# Patient Record
Sex: Male | Born: 1945 | Race: White | Hispanic: No | Marital: Married | Smoking: Former smoker
Health system: Southern US, Community
[De-identification: ages and names within clinical notes are randomized; demographics above are authoritative.]

## PROBLEM LIST (undated history)

## (undated) DIAGNOSIS — H269 Unspecified cataract: Secondary | ICD-10-CM

## (undated) DIAGNOSIS — E785 Hyperlipidemia, unspecified: Secondary | ICD-10-CM

## (undated) DIAGNOSIS — K219 Gastro-esophageal reflux disease without esophagitis: Secondary | ICD-10-CM

## (undated) DIAGNOSIS — R1310 Dysphagia, unspecified: Secondary | ICD-10-CM

## (undated) DIAGNOSIS — K2270 Barrett's esophagus without dysplasia: Secondary | ICD-10-CM

## (undated) DIAGNOSIS — I2510 Atherosclerotic heart disease of native coronary artery without angina pectoris: Secondary | ICD-10-CM

## (undated) DIAGNOSIS — J309 Allergic rhinitis, unspecified: Secondary | ICD-10-CM

## (undated) DIAGNOSIS — I10 Essential (primary) hypertension: Secondary | ICD-10-CM

## (undated) DIAGNOSIS — K224 Dyskinesia of esophagus: Secondary | ICD-10-CM

## (undated) DIAGNOSIS — R55 Syncope and collapse: Secondary | ICD-10-CM

## (undated) HISTORY — DX: Atherosclerotic heart disease of native coronary artery without angina pectoris: I25.10

## (undated) HISTORY — PX: TOTAL KNEE ARTHROPLASTY: SHX125

## (undated) HISTORY — PX: ABDOMINAL HERNIA REPAIR: SHX539

## (undated) HISTORY — DX: Syncope and collapse: R55

## (undated) HISTORY — PX: CARDIAC CATHETERIZATION: SHX172

## (undated) HISTORY — DX: Unspecified cataract: H26.9

## (undated) HISTORY — DX: Dysphagia, unspecified: R13.10

## (undated) HISTORY — DX: Allergic rhinitis, unspecified: J30.9

## (undated) HISTORY — DX: Dyskinesia of esophagus: K22.4

## (undated) HISTORY — DX: Gastro-esophageal reflux disease without esophagitis: K21.9

## (undated) HISTORY — DX: Barrett's esophagus without dysplasia: K22.70

## (undated) HISTORY — DX: Hyperlipidemia, unspecified: E78.5

---

## 2003-01-14 HISTORY — PX: GASTRIC BYPASS: SHX52

## 2005-01-13 HISTORY — PX: ROTATOR CUFF REPAIR: SHX139

## 2006-01-13 HISTORY — PX: CORONARY ANGIOPLASTY WITH STENT PLACEMENT: SHX49

## 2013-12-26 DIAGNOSIS — Z91199 Patient's noncompliance with other medical treatment and regimen due to unspecified reason: Secondary | ICD-10-CM | POA: Insufficient documentation

## 2014-06-21 DIAGNOSIS — Z9884 Bariatric surgery status: Secondary | ICD-10-CM | POA: Insufficient documentation

## 2014-06-21 DIAGNOSIS — E785 Hyperlipidemia, unspecified: Secondary | ICD-10-CM | POA: Insufficient documentation

## 2015-05-08 ENCOUNTER — Emergency Department (HOSPITAL_COMMUNITY)
Admission: EM | Admit: 2015-05-08 | Discharge: 2015-05-08 | Disposition: A | Discharge status: Home / Self Care | Payer: Medicare Other | Attending: Emergency Medicine | Admitting: Emergency Medicine

## 2015-05-08 ENCOUNTER — Emergency Department (HOSPITAL_COMMUNITY): Payer: Medicare Other

## 2015-05-08 ENCOUNTER — Encounter (HOSPITAL_COMMUNITY): Payer: Self-pay | Admitting: Family Medicine

## 2015-05-08 DIAGNOSIS — Z9889 Other specified postprocedural states: Secondary | ICD-10-CM | POA: Diagnosis not present

## 2015-05-08 DIAGNOSIS — J40 Bronchitis, not specified as acute or chronic: Secondary | ICD-10-CM | POA: Diagnosis not present

## 2015-05-08 DIAGNOSIS — Z7902 Long term (current) use of antithrombotics/antiplatelets: Secondary | ICD-10-CM | POA: Insufficient documentation

## 2015-05-08 DIAGNOSIS — Z7951 Long term (current) use of inhaled steroids: Secondary | ICD-10-CM | POA: Diagnosis not present

## 2015-05-08 DIAGNOSIS — H9209 Otalgia, unspecified ear: Secondary | ICD-10-CM | POA: Diagnosis not present

## 2015-05-08 DIAGNOSIS — Z79899 Other long term (current) drug therapy: Secondary | ICD-10-CM | POA: Insufficient documentation

## 2015-05-08 DIAGNOSIS — I10 Essential (primary) hypertension: Secondary | ICD-10-CM | POA: Insufficient documentation

## 2015-05-08 DIAGNOSIS — R05 Cough: Secondary | ICD-10-CM | POA: Diagnosis present

## 2015-05-08 HISTORY — DX: Essential (primary) hypertension: I10

## 2015-05-08 LAB — BASIC METABOLIC PANEL
Anion gap: 9 (ref 5–15)
BUN: 16 mg/dL (ref 6–20)
CALCIUM: 9.3 mg/dL (ref 8.9–10.3)
CHLORIDE: 108 mmol/L (ref 101–111)
CO2: 25 mmol/L (ref 22–32)
CREATININE: 1.09 mg/dL (ref 0.61–1.24)
GFR calc Af Amer: >60 mL/min (ref >60)
GLUCOSE: 83 mg/dL (ref 65–99)
Potassium: 4 mmol/L (ref 3.5–5.1)
SODIUM: 142 mmol/L (ref 135–145)

## 2015-05-08 LAB — CBC
HCT: 40.8 % (ref 39.0–52.0)
Hemoglobin: 13.3 g/dL (ref 13.0–17.0)
MCH: 26.8 pg (ref 26.0–34.0)
MCHC: 32.6 g/dL (ref 30.0–36.0)
MCV: 82.3 fL (ref 78.0–100.0)
PLATELETS: 313 10*3/uL (ref 150–400)
RBC: 4.96 MIL/uL (ref 4.22–5.81)
RDW: 16.6 % — AB (ref 11.5–15.5)
WBC: 12.7 10*3/uL — AB (ref 4.0–10.5)

## 2015-05-08 LAB — I-STAT TROPONIN, ED: TROPONIN I, POC: 0 ng/mL (ref 0.00–0.08)

## 2015-05-08 IMAGING — CR DG CHEST 2V
2 series · 2 of 2 positions shown · non-contrast
Comparison: None.

CLINICAL DATA: Cough, weakness and fever for 1 week. History
bronchitis.

EXAM:
CHEST  2 VIEW

[chest pa]
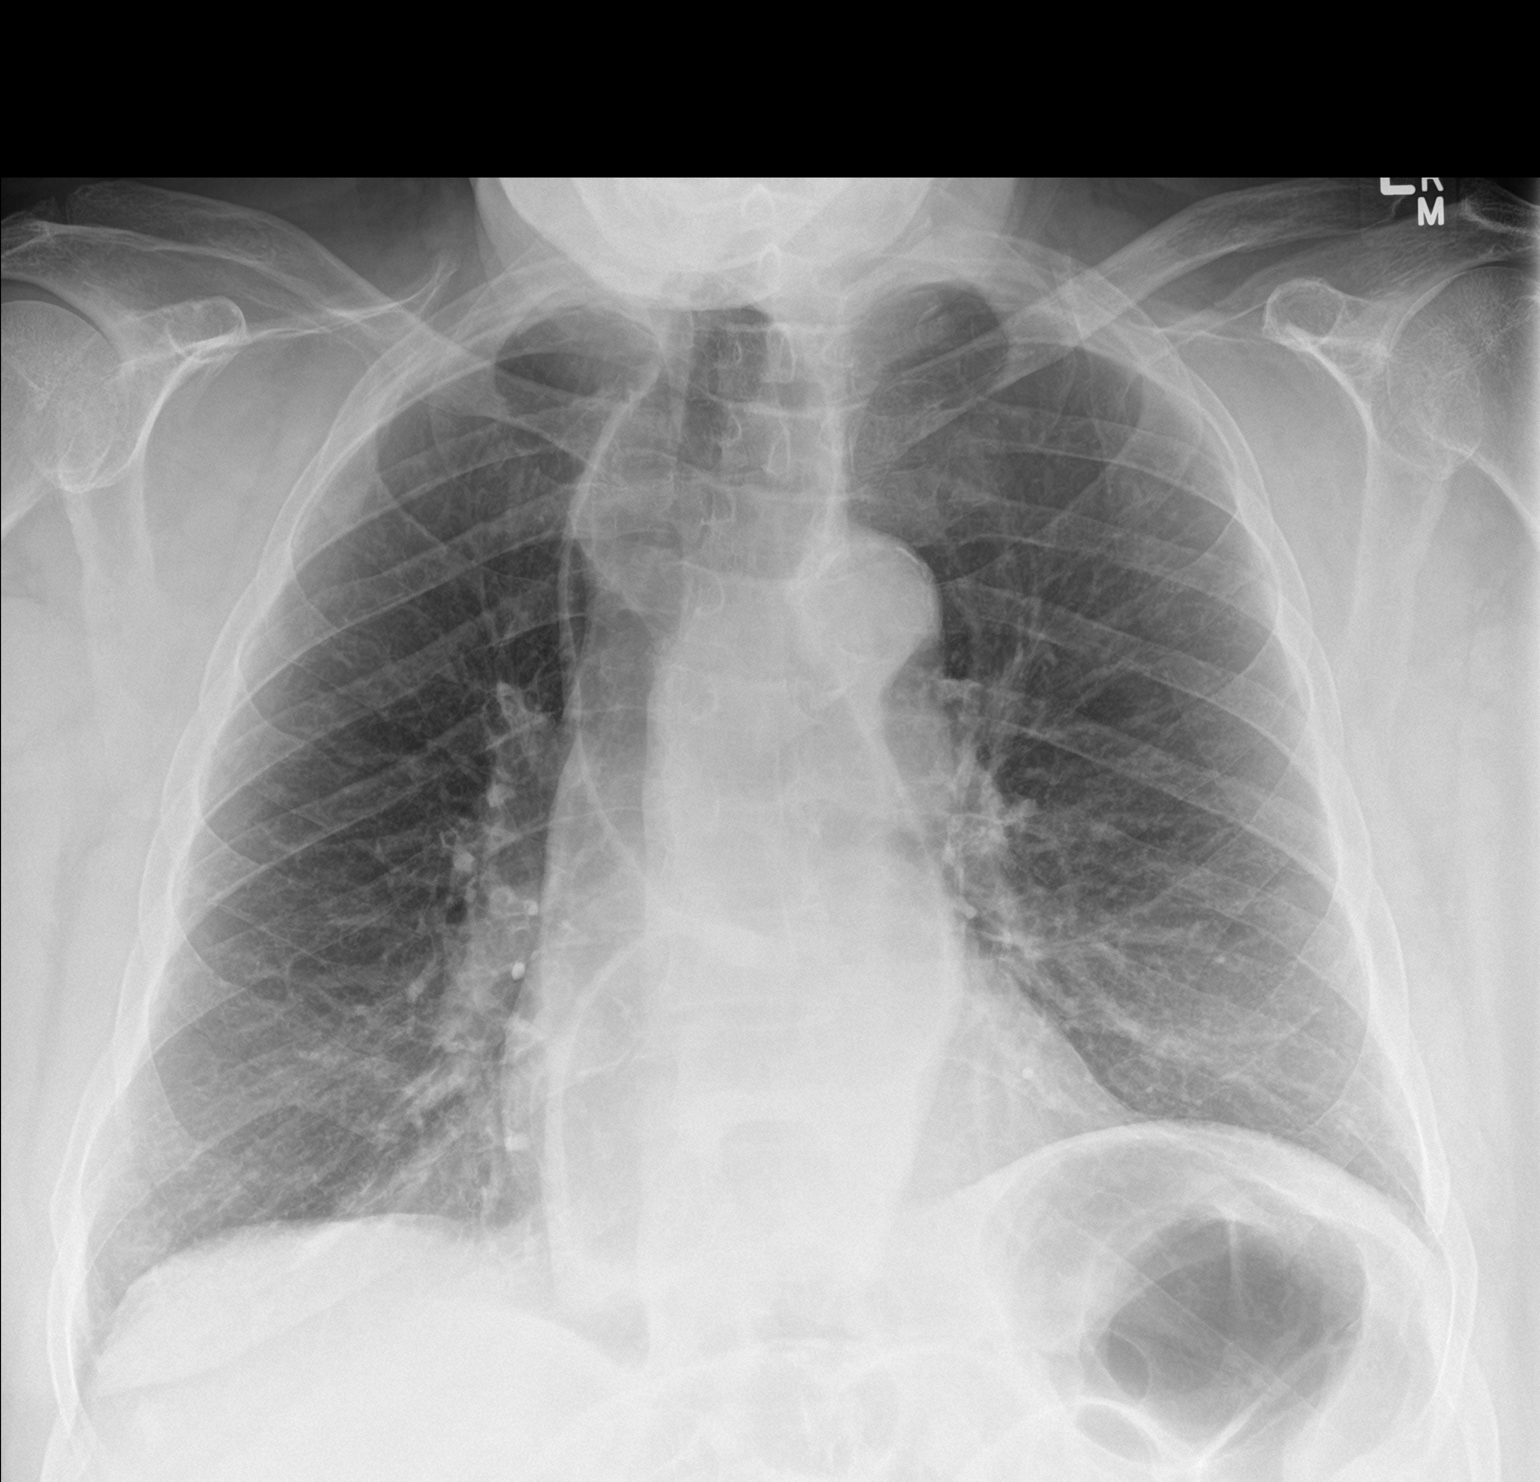

[chest lat]
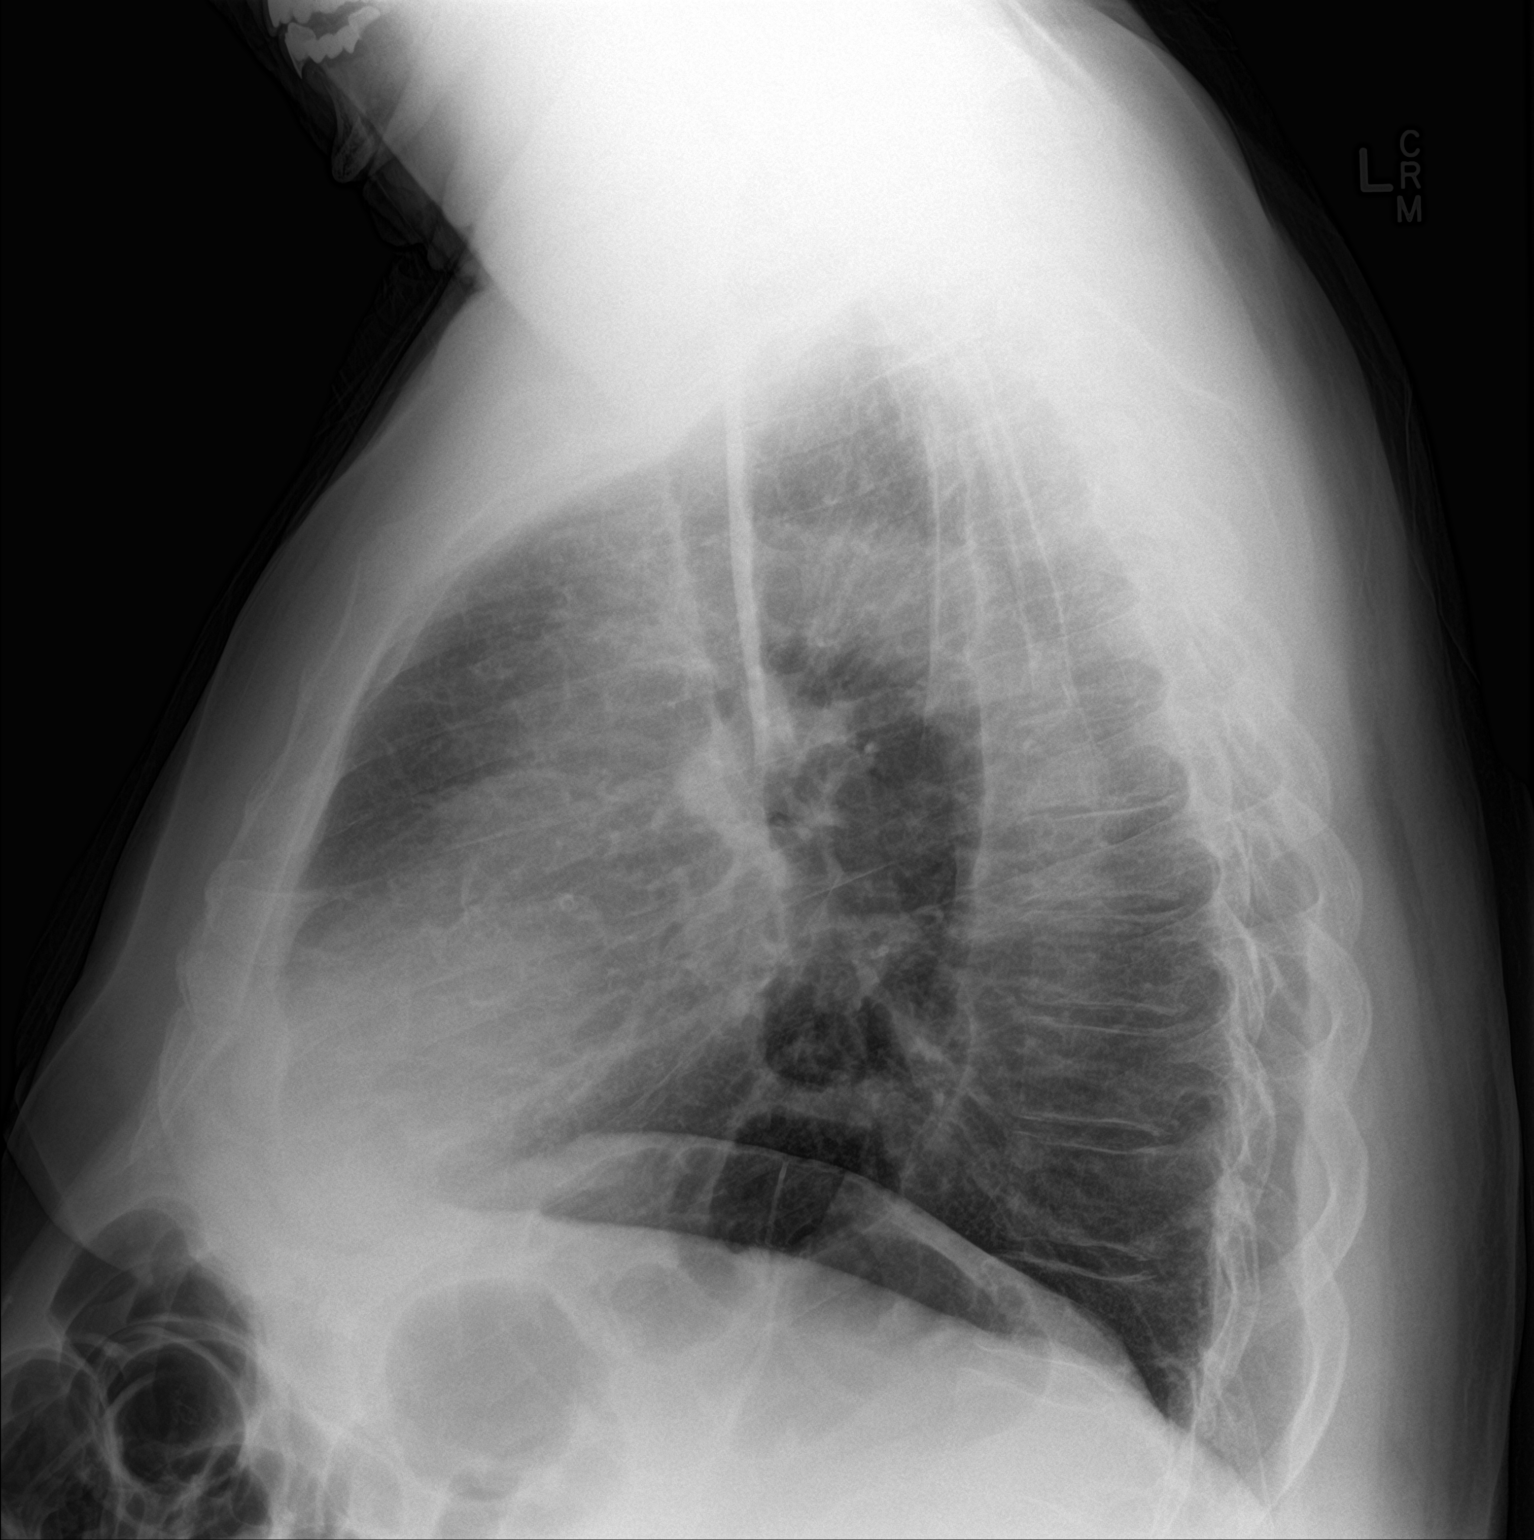

[2 of 2 positions shown; findings below may reference images not displayed]

FINDINGS: The lungs are clear. Heart size is normal. No pneumothorax or
pleural effusion. The patient has a markedly dilated and gas-filled
esophagus. No focal bony abnormality is identified.
IMPRESSION: No acute cardiopulmonary disease.

Markedly dilated esophagus is gas-filled.

## 2015-05-08 MED ORDER — ALBUTEROL SULFATE (2.5 MG/3ML) 0.083% IN NEBU
5.0000 mg | INHALATION_SOLUTION | Freq: Once | RESPIRATORY_TRACT | Status: AC
  Administered 2015-05-08: 5 mg via RESPIRATORY_TRACT
  Filled 2015-05-08: qty 6

## 2015-05-08 MED ORDER — ALBUTEROL SULFATE HFA 108 (90 BASE) MCG/ACT IN AERS: 2.0000 | INHALATION_SPRAY | RESPIRATORY_TRACT | Status: DC | PRN

## 2015-05-08 MED ORDER — PREDNISONE 20 MG PO TABS
60.0000 mg | ORAL_TABLET | Freq: Once | ORAL | Status: AC
  Administered 2015-05-08: 60 mg via ORAL
  Filled 2015-05-08: qty 3

## 2015-05-08 MED ORDER — ALBUTEROL SULFATE HFA 108 (90 BASE) MCG/ACT IN AERS
2.0000 | INHALATION_SPRAY | RESPIRATORY_TRACT | Status: DC | PRN
  Administered 2015-05-08: 2 via RESPIRATORY_TRACT
  Filled 2015-05-08: qty 6.7

## 2015-05-08 MED ORDER — BENZONATATE 100 MG PO CAPS: 100.0000 mg | ORAL_CAPSULE | Freq: Three times a day (TID) | ORAL | Status: DC

## 2015-05-08 NOTE — Discharge Instructions (Signed)
Upper Respiratory Infection, Adult Most upper respiratory infections (URIs) are a viral infection of the air passages leading to the lungs. A URI affects the nose, throat, and upper air passages. The most common type of URI is nasopharyngitis and is typically referred to as "the common cold." URIs run their course and usually go away on their own. Most of the time, a URI does not require medical attention, but sometimes a bacterial infection in the upper airways can follow a viral infection. This is called a secondary infection. Sinus and middle ear infections are common types of secondary upper respiratory infections. Bacterial pneumonia can also complicate a URI. A URI can worsen asthma and chronic obstructive pulmonary disease (COPD). Sometimes, these complications can require emergency medical care and may be life threatening.  CAUSES Almost all URIs are caused by viruses. A virus is a type of germ and can spread from one person to another.  RISKS FACTORS You may be at risk for a URI if:   You smoke.   You have chronic heart or lung disease.  You have a weakened defense (immune) system.   You are very young or very old.   You have nasal allergies or asthma.  You work in crowded or poorly ventilated areas.  You work in health care facilities or schools. SIGNS AND SYMPTOMS  Symptoms typically develop 2-3 days after you come in contact with a cold virus. Most viral URIs last 7-10 days. However, viral URIs from the influenza virus (flu virus) can last 14-18 days and are typically more severe. Symptoms may include:   Runny or stuffy (congested) nose.   Sneezing.   Cough.   Sore throat.   Headache.   Fatigue.   Fever.   Loss of appetite.   Pain in your forehead, behind your eyes, and over your cheekbones (sinus pain).  Muscle aches.  DIAGNOSIS  Your health care provider may diagnose a URI by:  Physical exam.  Tests to check that your symptoms are not due to  another condition such as:  Strep throat.  Sinusitis.  Pneumonia.  Asthma. TREATMENT  A URI goes away on its own with time. It cannot be cured with medicines, but medicines may be prescribed or recommended to relieve symptoms. Medicines may help:  Reduce your fever.  Reduce your cough.  Relieve nasal congestion. HOME CARE INSTRUCTIONS   Take medicines only as directed by your health care provider.   Gargle warm saltwater or take cough drops to comfort your throat as directed by your health care provider.  Use a warm mist humidifier or inhale steam from a shower to increase air moisture. This may make it easier to breathe.  Drink enough fluid to keep your urine clear or pale yellow.   Eat soups and other clear broths and maintain good nutrition.   Rest as needed.   Return to work when your temperature has returned to normal or as your health care provider advises. You may need to stay home longer to avoid infecting others. You can also use a face mask and careful hand washing to prevent spread of the virus.  Increase the usage of your inhaler if you have asthma.   Do not use any tobacco products, including cigarettes, chewing tobacco, or electronic cigarettes. If you need help quitting, ask your health care provider. PREVENTION  The best way to protect yourself from getting a cold is to practice good hygiene.   Avoid oral or hand contact with people with cold   symptoms.   Wash your hands often if contact occurs.  There is no clear evidence that vitamin C, vitamin E, echinacea, or exercise reduces the chance of developing a cold. However, it is always recommended to get plenty of rest, exercise, and practice good nutrition.  SEEK MEDICAL CARE IF:   You are getting worse rather than better.   Your symptoms are not controlled by medicine.   You have chills.  You have worsening shortness of breath.  You have brown or red mucus.  You have yellow or brown nasal  discharge.  You have pain in your face, especially when you bend forward.  You have a fever.  You have swollen neck glands.  You have pain while swallowing.  You have white areas in the back of your throat. SEEK IMMEDIATE MEDICAL CARE IF:   You have severe or persistent:  Headache.  Ear pain.  Sinus pain.  Chest pain.  You have chronic lung disease and any of the following:  Wheezing.  Prolonged cough.  Coughing up blood.  A change in your usual mucus.  You have a stiff neck.  You have changes in your:  Vision.  Hearing.  Thinking.  Mood. MAKE SURE YOU:   Understand these instructions.  Will watch your condition.  Will get help right away if you are not doing well or get worse.   This information is not intended to replace advice given to you by your health care provider. Make sure you discuss any questions you have with your health care provider.   Document Released: 06/25/2000 Document Revised: 05/16/2014 Document Reviewed: 04/06/2013 Elsevier Interactive Patient Education 2016 [employer]  

## 2015-05-08 NOTE — ED Provider Notes (Signed)
CSN: [id number]     Arrival date & time 05/08/15  1535 History   First MD Initiated Contact with Patient 05/08/15 1948     Chief Complaint  Patient presents with  . Cough  . Weakness     (Consider location/radiation/quality/duration/timing/severity/associated sxs/prior Treatment) HPI Comments: Patient is a 70 year old male with a past history of hypertension. He presents with a one-week history of worsening cough and fatigue. He states his cough is mostly been productive of clear sputum however today it's been a little bit green. He's had some wheezing and intermittent shortness of breath. He denies any chest pain. No leg swelling. No known fevers. [facility] He was started on Augmentin, Flonase, Mucinex, Zyrtec, Tessalon Perles and Atrovent nasal spray. He states he's not have any improvement in symptoms.  He has had associated rhinorrhea, sinus pressure and right ear pain.  Patient is a 70 y.o. male presenting with cough and weakness.  Cough Associated symptoms: ear pain, rhinorrhea, shortness of breath and wheezing   Associated symptoms: no chest pain, no chills, no diaphoresis, no fever, no headaches and no rash   Weakness Associated symptoms include shortness of breath. Pertinent negatives include no chest pain, no abdominal pain and no headaches.    Past Medical History  Diagnosis Date  . Hypertension    Past Surgical History  Procedure Laterality Date  . Abdominal surgery    . Cardiac surgery     History reviewed. No pertinent family history. Social History  Substance Use Topics  . Smoking status: Never Smoker   . Smokeless tobacco: None  . Alcohol Use: None    Review of Systems  Constitutional: Positive for fatigue. Negative for fever, chills and diaphoresis.  HENT: Positive for ear pain, postnasal drip, rhinorrhea and sinus pressure. Negative for congestion and sneezing.   Eyes: Negative.   Respiratory: Positive for cough, shortness of  breath and wheezing. Negative for chest tightness.   Cardiovascular: Negative for chest pain and leg swelling.  Gastrointestinal: Negative for nausea, vomiting, abdominal pain, diarrhea and blood in stool.  Genitourinary: Negative for frequency, hematuria, flank pain and difficulty urinating.  Musculoskeletal: Negative for back pain and arthralgias.  Skin: Negative for rash.  Neurological: Positive for weakness. Negative for dizziness, speech difficulty, numbness and headaches.      Allergies  Review of patient's allergies indicates no known allergies.  Home Medications   Prior to Admission medications   Medication Sig Start Date End Date Taking? Authorizing Provider  B Complex Vitamins (VITAMIN-B COMPLEX PO) Take 1 tablet by mouth daily.   Yes Historical Provider, MD  cetirizine (ZYRTEC) 10 MG tablet Take 10 mg by mouth daily.   Yes Historical Provider, MD  clopidogrel (PLAVIX) 75 MG tablet Take 75 mg by mouth daily.   Yes Historical Provider, MD  co-enzyme Q-10 50 MG capsule Take 50 mg by mouth daily.   Yes Historical Provider, MD  fluticasone (FLONASE) 50 MCG/ACT nasal spray Place 1 spray into both nostrils daily.   Yes Historical Provider, MD  guaiFENesin (MUCINEX) 600 MG 12 hr tablet Take 600 mg by mouth 2 (two) times daily as needed for cough.   Yes Historical Provider, MD  ipratropium (ATROVENT) 0.06 % nasal spray Place 2 sprays into both nostrils 3 (three) times daily.   Yes Historical Provider, MD  olmesartan-hydrochlorothiazide (BENICAR HCT) 20-12.5 MG tablet Take 1 tablet by mouth daily.   Yes Historical Provider, MD  omeprazole (PRILOSEC) 40 MG capsule Take 40  mg by mouth daily.   Yes Historical Provider, MD  ranolazine (RANEXA) 500 MG 12 hr tablet Take 500 mg by mouth 2 (two) times daily.   Yes Historical Provider, MD  benzonatate (TESSALON) 100 MG capsule Take 1 capsule (100 mg total) by mouth every 8 (eight) hours. 05/08/15   Rolan Bucco, MD   BP 121/61 mmHg  Pulse 107   Temp(Src) 98.2 F (36.8 C) (Oral)  Resp 22  SpO2 95% Physical Exam  Constitutional: He is oriented to person, place, and time. He appears well-developed and well-nourished.  HENT:  Head: Normocephalic and atraumatic.  Right Ear: External ear normal.  Left Ear: External ear normal.  Nose: Nose normal.  Mouth/Throat: Oropharynx is clear and moist. No oropharyngeal exudate.  Eyes: Pupils are equal, round, and reactive to light.  Neck: Normal range of motion. Neck supple.  Cardiovascular: Normal rate, regular rhythm and normal heart sounds.   Pulmonary/Chest: Effort normal. No respiratory distress. He has wheezes. He has no rales. He exhibits no tenderness.  Abdominal: Soft. Bowel sounds are normal. There is no tenderness. There is no rebound and no guarding.  Musculoskeletal: Normal range of motion. He exhibits no edema.  Lymphadenopathy:    He has no cervical adenopathy.  Neurological: He is alert and oriented to person, place, and time.  Skin: Skin is warm and dry. No rash noted.  Psychiatric: He has a normal mood and affect.    ED Course  Procedures (including critical care time) Labs Review Labs Reviewed  CBC - Abnormal; Notable for the following:    WBC 12.7 (*)    RDW 16.6 (*)    All other components within normal limits  BASIC METABOLIC PANEL  I-STAT TROPOININ, ED    Imaging Review Dg Chest 2 View  05/08/2015  CLINICAL DATA:  Cough, weakness and fever for 1 week. History bronchitis. EXAM: CHEST  2 VIEW COMPARISON:  None. FINDINGS: The lungs are clear. Heart size is normal. No pneumothorax or pleural effusion. The patient has a markedly dilated and gas-filled esophagus. No focal bony abnormality is identified. IMPRESSION: No acute cardiopulmonary disease. Markedly dilated esophagus is gas-filled. Electronically Signed   By: Drusilla Kanner M.D.   On: 05/08/2015 16:29   I have personally reviewed and evaluated these images and lab results as part of my medical  decision-making.   EKG Interpretation None      MDM   Final diagnoses:  Bronchitis    Patient presents with worsening cough. There is no evidence of pneumonia. His oxygen levels are normal. He has no increased work of breathing. He does have some wheezing on exam and was given an albuterol neb. He was feeling a little bit better after this. His lungs are essentially clear following this. His symptoms sound consistent with bronchitis. There is nothing that would suggest this being a cardiac origin. There is no suggestions of pulmonary embolus. He is very antsy to leave. He will continue his medications that were prescribed yesterday that he started this morning. He also was given a prescription for an additional amount of Tessalon pearls as he was concerned about running out. He is currently in process of moving to [facility] and has not yet established care. He can return here as needed for any ongoing or worsening symptoms.  Rolan Bucco, MD 05/08/15 2252

## 2015-05-08 NOTE — ED Notes (Addendum)
Pt here for cough, weakness, cold x 1 week. sts fever. sts some diarrhea yesterday. Pt currently being treated with abx for URI.

## 2015-05-14 ENCOUNTER — Emergency Department (HOSPITAL_COMMUNITY)
Admission: EM | Admit: 2015-05-14 | Discharge: 2015-05-14 | Disposition: A | Discharge status: Home / Self Care | Payer: Medicare Other | Attending: Emergency Medicine | Admitting: Emergency Medicine

## 2015-05-14 ENCOUNTER — Encounter (HOSPITAL_COMMUNITY): Payer: Self-pay | Admitting: Emergency Medicine

## 2015-05-14 ENCOUNTER — Emergency Department (HOSPITAL_COMMUNITY): Payer: Medicare Other

## 2015-05-14 DIAGNOSIS — R05 Cough: Secondary | ICD-10-CM

## 2015-05-14 DIAGNOSIS — J40 Bronchitis, not specified as acute or chronic: Secondary | ICD-10-CM

## 2015-05-14 DIAGNOSIS — Z7902 Long term (current) use of antithrombotics/antiplatelets: Secondary | ICD-10-CM | POA: Insufficient documentation

## 2015-05-14 DIAGNOSIS — Z79899 Other long term (current) drug therapy: Secondary | ICD-10-CM | POA: Insufficient documentation

## 2015-05-14 DIAGNOSIS — I10 Essential (primary) hypertension: Secondary | ICD-10-CM | POA: Diagnosis not present

## 2015-05-14 DIAGNOSIS — R059 Cough, unspecified: Secondary | ICD-10-CM

## 2015-05-14 IMAGING — CR DG CHEST 2V
2 series · 2 of 2 positions shown · non-contrast
Comparison: [DATE].

CLINICAL DATA: Cough and shortness of breath.

EXAM:
CHEST  2 VIEW

[w chest pa]
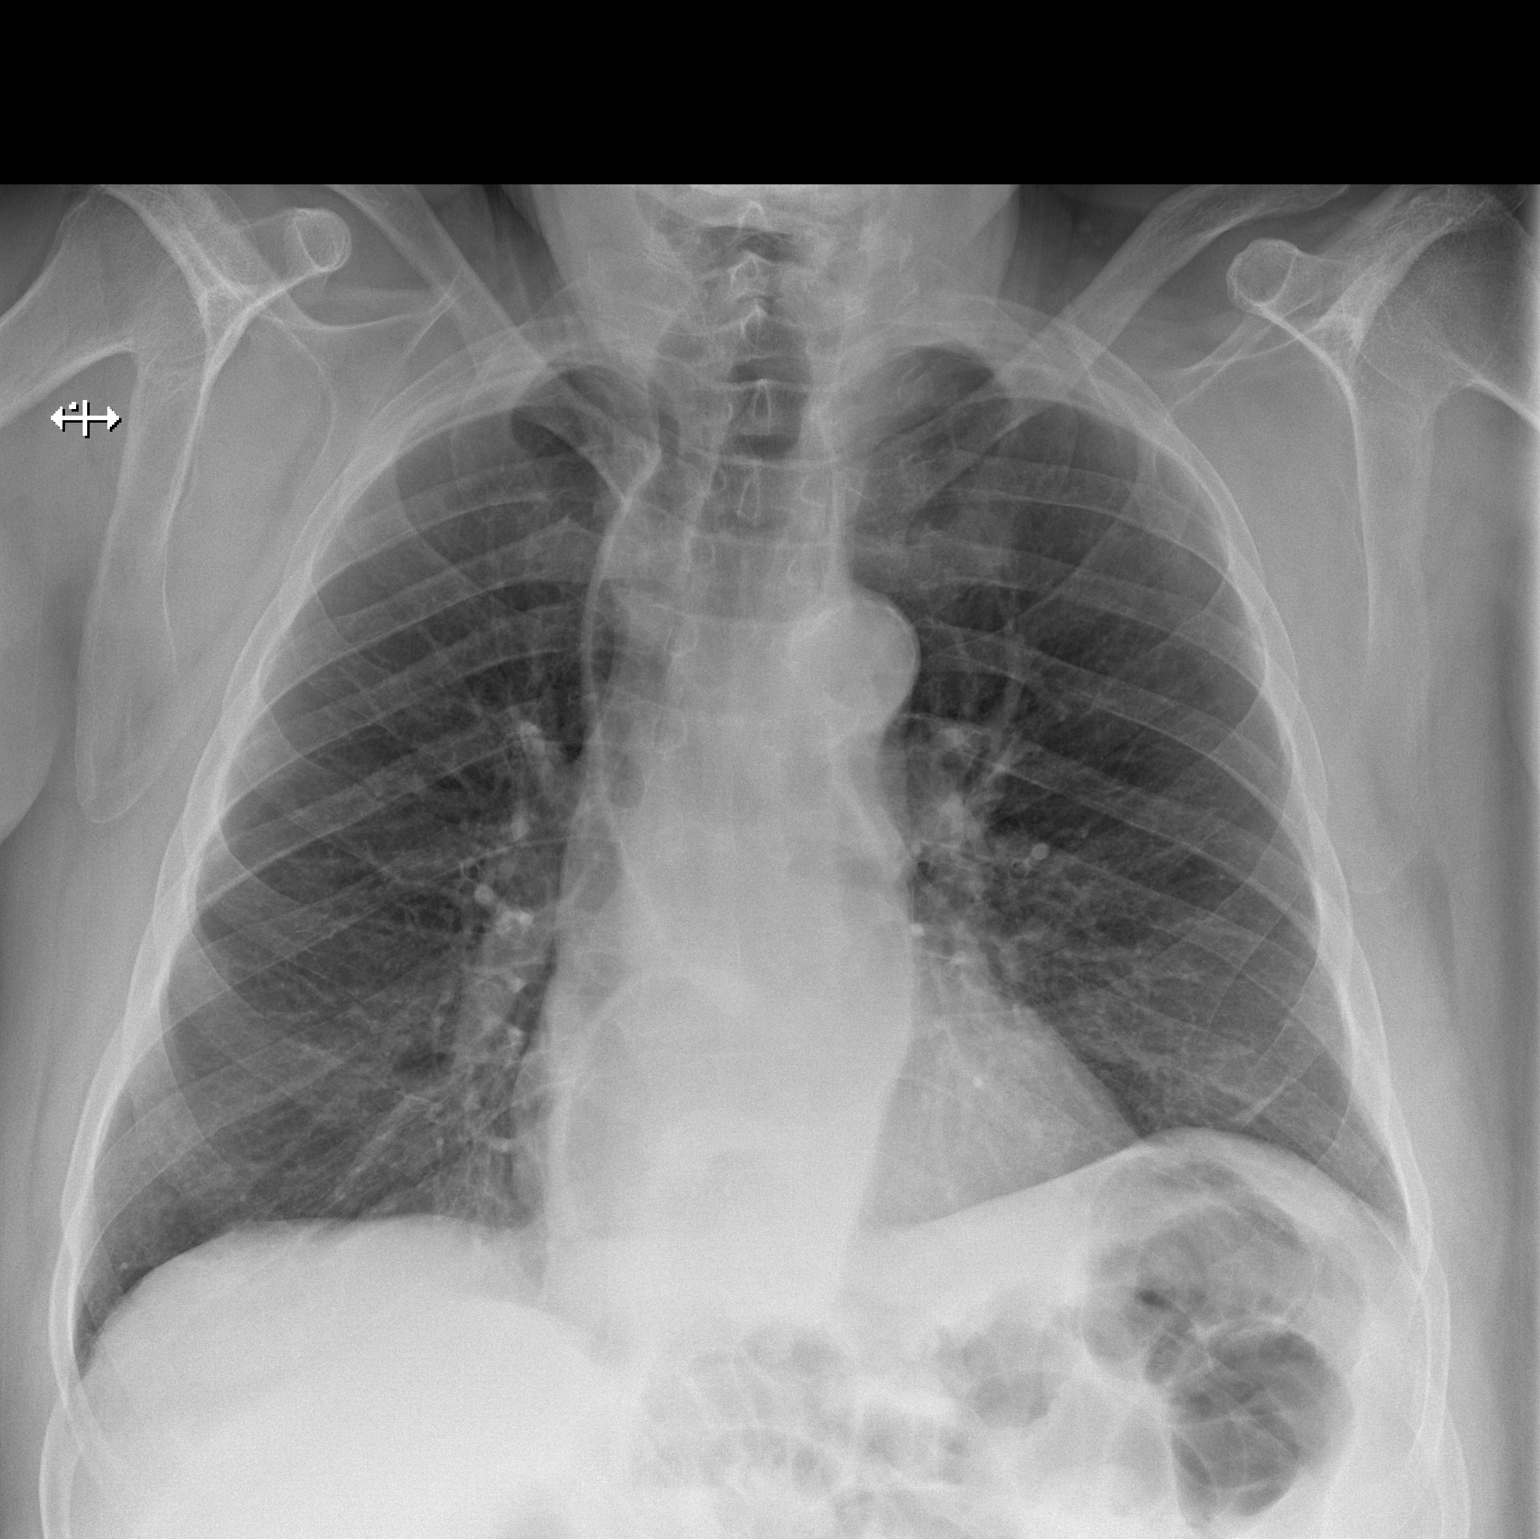

[w chest lat]
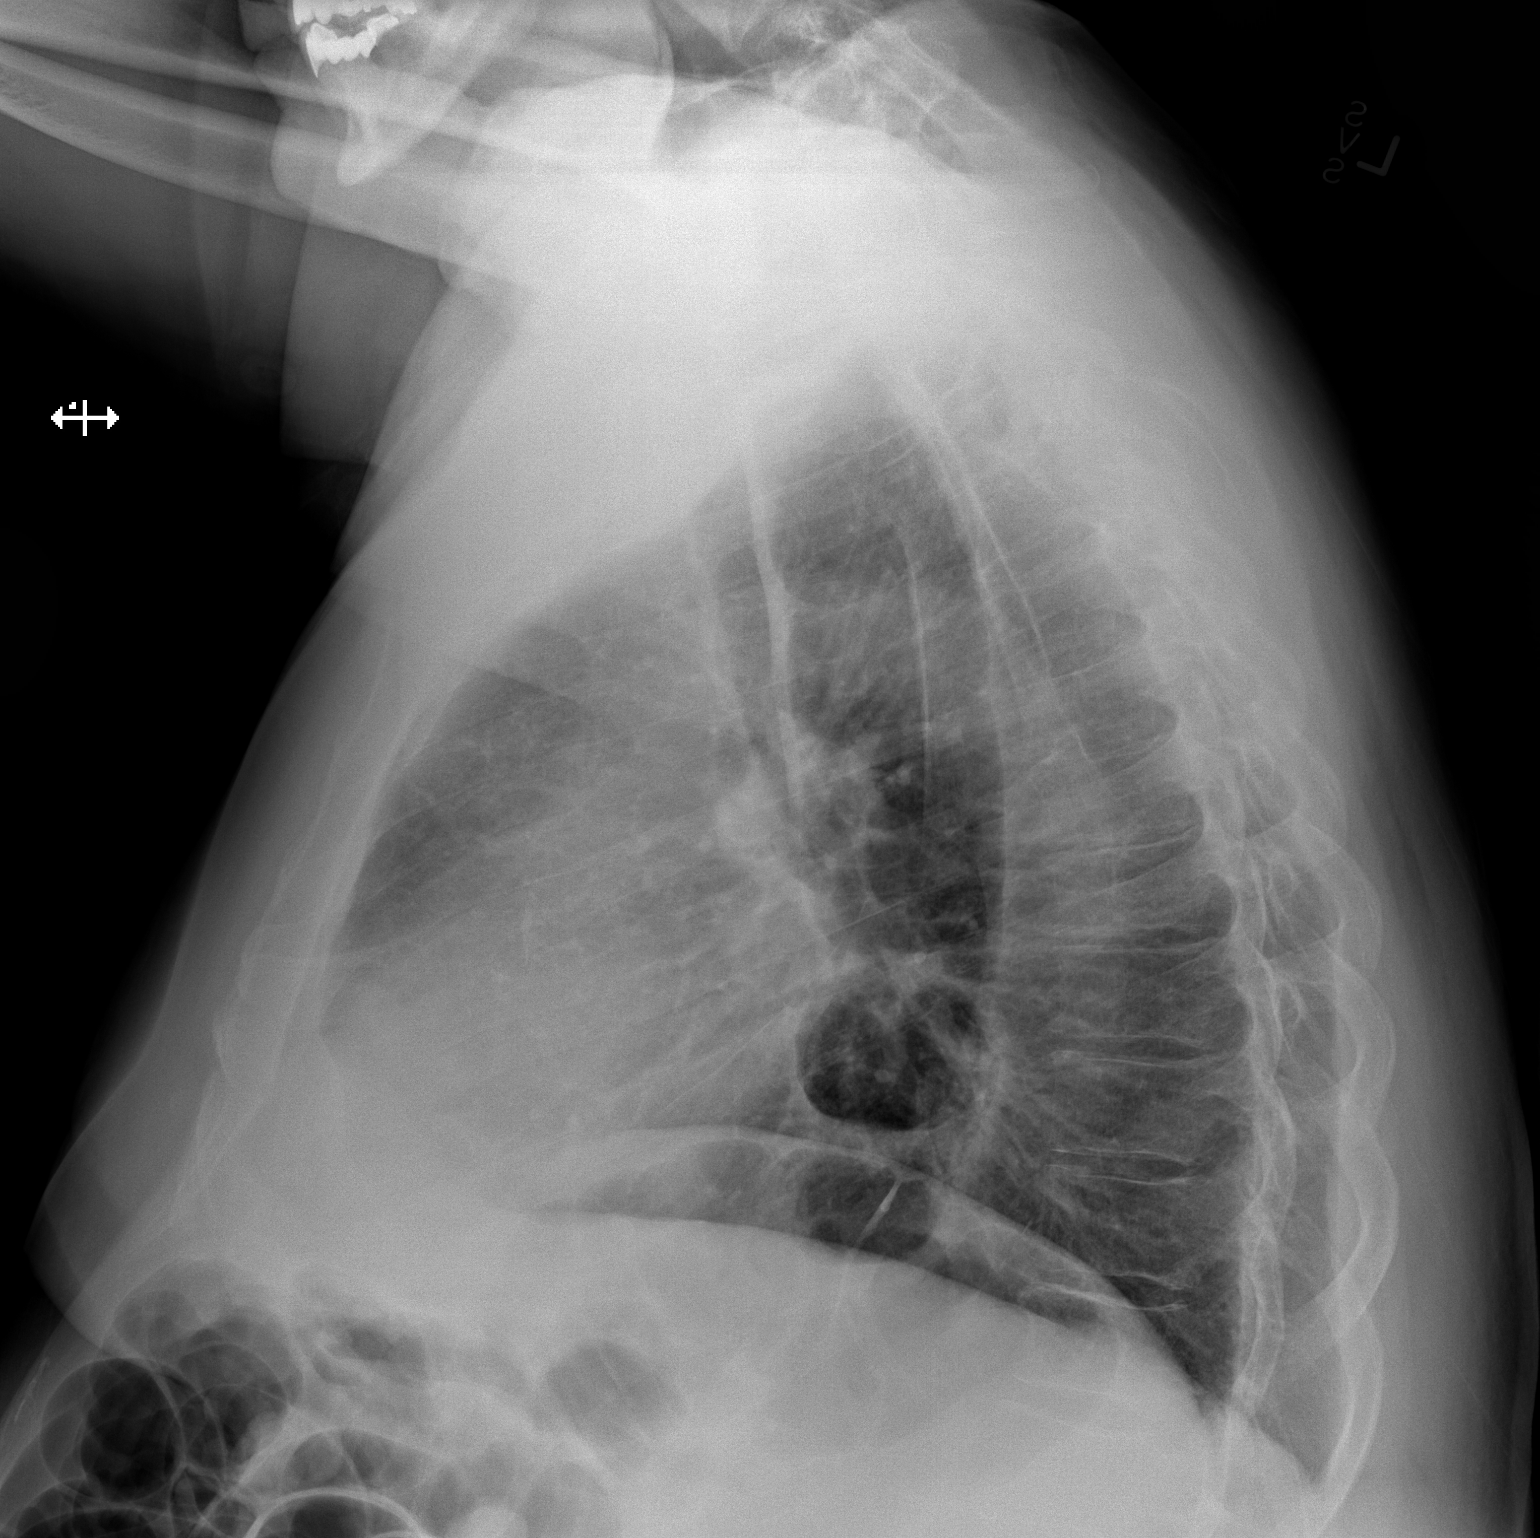

[2 of 2 positions shown; findings below may reference images not displayed]

FINDINGS: Normal sized heart. Clear lungs. Mild central peribronchial
thickening. Again demonstrated is a markedly dilated, tortuous,
gas-filled esophagus. There is also a possible hiatal hernia.
Unremarkable bones.
IMPRESSION: 1. No acute abnormality.
2. Stable mild bronchitic changes.
3. Stable dilated, gas-filled esophagus and possible hiatal hernia.

## 2015-05-14 MED ORDER — ALBUTEROL SULFATE (2.5 MG/3ML) 0.083% IN NEBU: 5.0000 mg | INHALATION_SOLUTION | Freq: Once | RESPIRATORY_TRACT | Status: DC

## 2015-05-14 MED ORDER — GUAIFENESIN-CODEINE 100-10 MG/5ML PO SOLN: 10.0000 mL | Freq: Three times a day (TID) | ORAL | Status: DC | PRN

## 2015-05-14 MED ORDER — FLUTICASONE PROPIONATE 50 MCG/ACT NA SUSP: 2.0000 | Freq: Every day | NASAL | Status: DC

## 2015-05-14 MED ORDER — PREDNISONE 20 MG PO TABS: 40.0000 mg | ORAL_TABLET | Freq: Every day | ORAL | Status: DC

## 2015-05-14 MED ORDER — IPRATROPIUM-ALBUTEROL 0.5-2.5 (3) MG/3ML IN SOLN
3.0000 mL | Freq: Once | RESPIRATORY_TRACT | Status: AC
  Administered 2015-05-14: 3 mL via RESPIRATORY_TRACT
  Filled 2015-05-14: qty 3

## 2015-05-14 NOTE — ED Notes (Signed)
Called pt back to triage x1 with no response 

## 2015-05-14 NOTE — ED Provider Notes (Signed)
CSN: [id number]     Arrival date & time 05/14/15  1511 History   First MD Initiated Contact with Patient 05/14/15 2102     Chief Complaint  Patient presents with  . Cough  . Shortness of Breath   HPI   70 year old male presents today for reevaluation of chronic cough. Patient reports that 10 days ago he had very minor upper respiratory complaints with cough that has persisted. Patient notes that several days after onset of symptoms [facility], he was prescribed amoxicillin and Tessalon Perles. He reports symptoms continue to persist with worsening cough. 5 days ago [facility] was [facility] with [facility], given a breathing treatment, inhaler and Tessalon Perles again. Patient reports the breathing treatment improved his symptoms, but this only lasted one day. Patient notes that he followed up 4 days ago in a emergency room in [place], was given steroids IV, given prednisone 5 day course, and cough medicine with codeine. Patient reports that this provided minor relief but he is running out of the cough medication. Patient notes mild shortness of breath associated with cough, he denies any chest pain, baseline shortness of breath, upper respiratory congestion, indigestion, abdominal pain, fever, nausea or vomiting. Patient denies any history of heart failure or lower extremity swelling or edema. Patient reports he is a nonsmoker, no history of COPD asthma, living in [place] to [place].       Past Medical History  Diagnosis Date  . Hypertension    Past Surgical History  Procedure Laterality Date  . Abdominal surgery    . Cardiac surgery     History reviewed. No pertinent family history. Social History  Substance Use Topics  . Smoking status: Never Smoker   . Smokeless tobacco: None  . Alcohol Use: None    Review of Systems  All other systems reviewed and are negative.   Allergies  Review of patient's allergies  indicates no known allergies.  Home Medications   Prior to Admission medications   Medication Sig Start Date End Date Taking? Authorizing Provider  B Complex Vitamins (VITAMIN-B COMPLEX PO) Take 1 tablet by mouth daily.    Historical Provider, MD  benzonatate (TESSALON) 100 MG capsule Take 1 capsule (100 mg total) by mouth every 8 (eight) hours. 05/08/15   Rolan Bucco, MD  cetirizine (ZYRTEC) 10 MG tablet Take 10 mg by mouth daily.    Historical Provider, MD  clopidogrel (PLAVIX) 75 MG tablet Take 75 mg by mouth daily.    Historical Provider, MD  co-enzyme Q-10 50 MG capsule Take 50 mg by mouth daily.    Historical Provider, MD  fluticasone (FLONASE) 50 MCG/ACT nasal spray Place 2 sprays into both nostrils daily. 05/14/15   Eyvonne Mechanic, PA-C  guaiFENesin (MUCINEX) 600 MG 12 hr tablet Take 600 mg by mouth 2 (two) times daily as needed for cough.    Historical Provider, MD  guaiFENesin-codeine 100-10 MG/5ML syrup Take 10 mLs by mouth 3 (three) times daily as needed for cough. 05/14/15   Eyvonne Mechanic, PA-C  ipratropium (ATROVENT) 0.06 % nasal spray Place 2 sprays into both nostrils 3 (three) times daily.    Historical Provider, MD  olmesartan-hydrochlorothiazide (BENICAR HCT) 20-12.5 MG tablet Take 1 tablet by mouth daily.    Historical Provider, MD  omeprazole (PRILOSEC) 40 MG capsule Take 40 mg by mouth daily.    Historical Provider, MD  predniSONE (DELTASONE) 20 MG tablet Take 2 tablets (40  mg total) by mouth daily. 05/14/15   Eyvonne MechanicJeffrey Kalia Vahey, PA-C  ranolazine (RANEXA) 500 MG 12 hr tablet Take 500 mg by mouth 2 (two) times daily.    Historical Provider, MD   BP 121/87 mmHg  Pulse 85  Temp(Src) 97.9 F (36.6 C) (Oral)  Resp 20  SpO2 97%   Physical Exam  Constitutional: He is oriented to person, place, and time. He appears well-developed and well-nourished.  Cough noted  HENT:  Head: Normocephalic and atraumatic.  Right Ear: Hearing and tympanic membrane normal.  Left Ear: Hearing and  tympanic membrane normal.  Mouth/Throat: Uvula is midline and oropharynx is clear and moist. No oropharyngeal exudate, posterior oropharyngeal edema, posterior oropharyngeal erythema or tonsillar abscesses.  Eyes: Conjunctivae are normal. Pupils are equal, round, and reactive to light. Right eye exhibits no discharge. Left eye exhibits no discharge. No scleral icterus.  Neck: Normal range of motion. No JVD present. No tracheal deviation present.  Cardiovascular: Normal rate, regular rhythm, normal heart sounds and intact distal pulses.  Exam reveals no gallop and no friction rub.   No murmur heard. Pulmonary/Chest: Effort normal and breath sounds normal. No stridor. No respiratory distress. He has no wheezes. He has no rales. He exhibits no tenderness.  Abdominal: There is no tenderness.  Musculoskeletal: Normal range of motion. He exhibits no edema or tenderness.  Neurological: He is alert and oriented to person, place, and time. Coordination normal.  Skin: Skin is warm. No rash noted. No erythema. No pallor.  Psychiatric: He has a normal mood and affect. His behavior is normal. Judgment and thought content normal.  Nursing note and vitals reviewed.   ED Course  Procedures (including critical care time) Labs Review Labs Reviewed - No data to display  Imaging Review Dg Chest 2 View  05/14/2015  CLINICAL DATA:  Cough and shortness of breath. EXAM: CHEST  2 VIEW COMPARISON:  05/08/2015. FINDINGS: Normal sized heart. Clear lungs. Mild central peribronchial thickening. Again demonstrated is a markedly dilated, tortuous, gas-filled esophagus. There is also a possible hiatal hernia. Unremarkable bones. IMPRESSION: 1. No acute abnormality. 2. Stable mild bronchitic changes. 3. Stable dilated, gas-filled esophagus and possible hiatal hernia. Electronically Signed   By: Beckie SaltsSteven  Reid M.D.   On: 05/14/2015 16:38   I have personally reviewed and evaluated these images and lab results as part of my medical  decision-making.   EKG Interpretation   Date/Time:  Monday May 14 2015 21:10:18 EDT Ventricular Rate:  88 PR Interval:  173 QRS Duration: 178 QT Interval:  456 QTC Calculation: 552 R Axis:   -37 Text Interpretation:  Sinus rhythm Left bundle branch block No previous  tracing Confirmed by Anitra LauthPLUNKETT  MD, Alphonzo LemmingsWHITNEY ([id number]) on 05/14/2015 9:22:56 PM      MDM   Final diagnoses:  Bronchitis  Cough    Labs:   Imaging:DG chest 2 view  Consults:  Therapeutics: DuoNeb  Discharge Meds: Prednisone, Flonase, guaifenesin- codeine   Assessment/Plan: 70 year old male presents today with likely bronchitis. Patient has no signs of infectious etiology, he has reassuring vital signs, no significant findings on physical exam. Continued cough may be expected with bronchitis, he will be instructed to continue using albuterol, Flonase, prednisone, and cough medication as needed. Patient requesting follow-up with pulmonology, he is given pulmonology follow-up and strict return precautions. Patient verbalized understanding and agreement to today's plan. Patients care shared with Gwyneth SproutWhitney Plunkett MD.         Eyvonne MechanicJeffrey Bartolo Montanye, PA-C 05/15/15 0011  Gwyneth SproutWhitney Plunkett,  MD 05/15/15 2044

## 2015-05-14 NOTE — ED Notes (Addendum)
Patient here with complaints of uncontrolled cough. States that this is his third visit to the ER. Antibiotics, steroids, tessalon, albuterol with no relief.  Patient states only relief is standing over table.

## 2015-05-14 NOTE — Discharge Instructions (Signed)
Please follow-up with pulmonology if you continue to experience concerning signs or symptoms. Please return to the emergency room if any new or worsening signs or symptoms present.

## 2015-05-16 ENCOUNTER — Institutional Professional Consult (permissible substitution): Payer: Medicare Other | Admitting: Internal Medicine

## 2015-05-18 DIAGNOSIS — D500 Iron deficiency anemia secondary to blood loss (chronic): Secondary | ICD-10-CM | POA: Insufficient documentation

## 2015-05-23 ENCOUNTER — Encounter: Payer: Self-pay | Admitting: Pulmonary Disease

## 2015-05-23 ENCOUNTER — Ambulatory Visit (INDEPENDENT_AMBULATORY_CARE_PROVIDER_SITE_OTHER): Payer: Medicare Other | Admitting: Pulmonary Disease

## 2015-05-23 VITALS — BP 138/76 | HR 81 | Ht 66.0 in | Wt 242.0 lb

## 2015-05-23 DIAGNOSIS — R059 Cough, unspecified: Secondary | ICD-10-CM | POA: Insufficient documentation

## 2015-05-23 DIAGNOSIS — I10 Essential (primary) hypertension: Secondary | ICD-10-CM | POA: Insufficient documentation

## 2015-05-23 DIAGNOSIS — I2510 Atherosclerotic heart disease of native coronary artery without angina pectoris: Secondary | ICD-10-CM | POA: Insufficient documentation

## 2015-05-23 DIAGNOSIS — R05 Cough: Secondary | ICD-10-CM

## 2015-05-23 DIAGNOSIS — K219 Gastro-esophageal reflux disease without esophagitis: Secondary | ICD-10-CM

## 2015-05-23 DIAGNOSIS — I447 Left bundle-branch block, unspecified: Secondary | ICD-10-CM | POA: Insufficient documentation

## 2015-05-23 DIAGNOSIS — J309 Allergic rhinitis, unspecified: Secondary | ICD-10-CM

## 2015-05-23 NOTE — Progress Notes (Signed)
Subjective:    Patient ID: Ian [name], male    DOB: [birth year], 70 y.o.   MRN: [id number]  HPI He reports he has had bronchitis twice since January 2017. He gets a cough & bronchitis once yearly treated with antibiotics & Prednisone. He reports remote history of pneumonia as a child. He was on "sulfa" as a child but cannot recall why. No history of breathing problems as a child. He reports with his initial episode he was seen in the E.D. in [place], [place]. He was given a neb tx in the E.D. On his follow-up with his PCP he was started on Breo with a short course. He reports his cough did not totally resolve. His wife reports he will intermittently cough at baseline. He reports his cough is intermittently productive of a white mucus on a daily basis. Denies any audible wheezing. He was subsequently seen at a [facility] for a cough productive of a clear mucus more than his baseline. His cough was "nonstop" so he presented to th ED on 4/25 & again on 5/1. He was treated with a nebulizer treatment. He was given cough medication and an inhaler with a spacer. He was subsequently treated with Prednisone and IM injections. He reports his cough has improved significantly but continues. He reports a hoarseness to his voice. He was previously waking up at night coughing. Wife reports he will only cough occasionally through the night over the last 2 nights. Previously he was having significant sinus congestion & pressure. He was started on Flonase by the [facility]. He does report post-nasal drainage. No fever, chills, or sweats. Denies any dyspnea. He does report some intermittent chest "tightness". No reflux or dyspepsia but does have morning brash water taste despite taking Prilosec.   Review of Systems No rashes or bruising. No dry eyes, dry mouth, or oral ulcers. A pertinent 14 point review of systems is negative except as per the history of presenting illness.  Allergies  Allergen Reactions  . Sulfa  Antibiotics Other (See Comments)    Current Outpatient Prescriptions on File Prior to Visit  Medication Sig Dispense Refill  . B Complex Vitamins (VITAMIN-B COMPLEX PO) Take 1 tablet by mouth daily.    . clopidogrel (PLAVIX) 75 MG tablet Take 75 mg by mouth daily.    Marland Kitchen co-enzyme Q-10 50 MG capsule Take 50 mg by mouth daily.    . fluticasone (FLONASE) 50 MCG/ACT nasal spray Place 2 sprays into both nostrils daily. 9.9 g 2  . guaiFENesin (MUCINEX) 600 MG 12 hr tablet Take 600 mg by mouth 2 (two) times daily as needed for cough.    . olmesartan-hydrochlorothiazide (BENICAR HCT) 20-12.5 MG tablet Take 1 tablet by mouth 2 (two) times daily.     Marland Kitchen omeprazole (PRILOSEC) 40 MG capsule Take 40 mg by mouth daily.    . ranolazine (RANEXA) 500 MG 12 hr tablet Take 500 mg by mouth 2 (two) times daily.     No current facility-administered medications on file prior to visit.    Past Medical History  Diagnosis Date  . Hypertension   . Allergic rhinitis   . GERD (gastroesophageal reflux disease)   . Coronary artery disease     Stent x1 2008    Past Surgical History  Procedure Laterality Date  . Abdominal surgery    . Cardiac surgery      stent  . Total knee arthroplasty      b/l  . Rotator cuff repair Left   .  Gastric bypass  2005  . Coronary angioplasty with stent placement  2008    stent x1    Family History  Problem Relation Age of Onset  . Breast cancer Mother   . Diabetes Mother   . Prostate cancer Father   . Breast cancer Sister   . Diabetes Sister   . Lung disease Neg Hx     Social History   Social History  . Marital Status: Married    Spouse Name: N/A  . Number of Children: N/A  . Years of Education: N/A   Social History Main Topics  . Smoking status: Former Smoker -- 1.00 packs/day for 18 years    Types: Cigarettes    Start date: 02/09/1964    Quit date: 05/23/1982  . Smokeless tobacco: Never Used     Comment: Also smoked 4-5 cigars for 10 years after cigarettes   . Alcohol Use: 0.0 oz/week    0 Standard drinks or equivalent per week     Comment: Rare  . Drug Use: No  . Sexual Activity: Not Asked   Other Topics Concern  . None   Social History Narrative   Originally from [place]. Has lived in [place], [place]. Previously has traveled to [place], [place], [place], [place], & [place]. Currently works as a Designer, fashion/clothingmason contractor. No pets currently. Remote bird exposure. No mold or hot tub exposure. No known asbestos exposure.       Objective:   Physical Exam BP 138/76 mmHg  Pulse 81  Ht 5\' 6"  (1.676 m)  Wt 242 lb (109.77 kg)  BMI 39.08 kg/m2  SpO2 96% General:  Awake. Alert. No acute distress. Morbidly obese.  Integument:  Warm & dry. No rash on exposed skin. No bruising. Lymphatics:  No appreciated cervical or supraclavicular lymphadenoapthy. HEENT:  Moist mucus membranes. No oral ulcers. No scleral injection or icterus. Mild bilateral nasal turbinate swelling with pale mucosa. Cardiovascular:  Regular rate. Trace lower extremity edema. No appreciable JVD.  Pulmonary:  Good aeration & clear to auscultation bilaterally. Symmetric chest wall expansion. No accessory muscle use. Abdomen: Soft. Normal bowel sounds. Protuberant. Grossly nontender. Musculoskeletal:  Normal bulk and tone. Hand grip strength 5/5 bilaterally. No joint deformity or effusion appreciated. Neurological:  CN 2-12 grossly in tact. No meningismus. Moving all 4 extremities equally. Symmetric brachioradialis deep tendon reflexes. Psychiatric:  Mood and affect congruent. Speech normal rhythm, rate & tone.   IMAGING CXR PA/LAT 05/14/15 (personally reviewed by me): Hiatal hernia noted. No parenchymal nodule or opacity. Persistent elevation of left hemidiaphragm. Heart normal in size & mediastinum otherwise normal in contour. No pleural effusion.  LABS 05/08/15 Troponin I: 0.00 CBC: 20.7/13.3/40.8/313 BMP: 142/4.0/108/25/16/1.09/83/9.3    Assessment & Plan:  70 year old male  with history of recurrent cough this spring. He does have significant history of prior tobacco use which raises the possibility of underlying COPD. However, the patient's reflux and postnasal drainage from allergic rhinitis are likely the major culprits for his cough. Given lack of symptomatic benefit I believe holding off on Breo at this time is reasonable. I instructed the patient and his wife to contact my office if he had any new breathing problems before his next appointment or worsening of his cough.  1. Cough: Likely multifactorial. Improving. Checking full pulmonary function testing follow-up appointment. Continuing albuterol inhaler as needed. Holding on Breo at this time.  2. GERD: Hiatal hernia present on chest x-ray imaging. Checking esophagogram. Continuing Prilosec. 3. Allergic rhinitis: Checking maxillofacial CT scan without  contrast. Holding Flonase. Instructed patient to use nasal saline rinse 1-2 times daily. 4. Follow-up: Patient to return to clinic in 4-6 weeks.  Donna Christen Jamison Neighbor, M.D. [facility] Pulmonary & Critical Care Pager:  [phone] After 3pm or if no response, call [phone] 3:55 PM 05/23/2015

## 2015-05-23 NOTE — Patient Instructions (Addendum)
   Stop using Breo inhaler. He can continue to use your albuterol inhaler on an as-needed basis.  Please call me if you have any worsening in your cough or new breathing problems before your next appointment.  Continue to hold off on using Flonase nasal spray. Try using NeilMed nasal rinse 1-2 times daily.  We will discuss your test results at her next appointment.  I will see you back in 4-6 weeks.  TESTS ORDERED: 1. Maxillofacial CT scan without contrast 2. Esophagogram 3. Full pulmonary function testing at follow-up appointment

## 2015-05-29 ENCOUNTER — Telehealth: Payer: Self-pay | Admitting: Pulmonary Disease

## 2015-05-29 NOTE — Telephone Encounter (Signed)
Roslynn AmbleJennings E Nestor, MD  Tommie SamsMindy S Silva, CMA           Please make sure that radiology is aware the patient is post gastric bypass for the barium tablet portion of the test (Esophagram).  Thanks    Called radiology at [facility] and spoke with [place]. I informed her of the message above. She voiced understanding and had no further questions. Nothing further needed.

## 2015-06-14 ENCOUNTER — Other Ambulatory Visit (HOSPITAL_COMMUNITY): Payer: Self-pay | Admitting: Respiratory Therapy

## 2015-06-15 ENCOUNTER — Encounter (HOSPITAL_COMMUNITY): Payer: Self-pay

## 2015-06-15 ENCOUNTER — Other Ambulatory Visit: Payer: Self-pay | Admitting: Pulmonary Disease

## 2015-06-15 ENCOUNTER — Ambulatory Visit (HOSPITAL_COMMUNITY)
Admission: RE | Admit: 2015-06-15 | Discharge: 2015-06-15 | Disposition: A | Discharge status: Home / Self Care | Payer: Medicare Other | Source: Ambulatory Visit | Attending: Pulmonary Disease | Admitting: Pulmonary Disease

## 2015-06-15 DIAGNOSIS — R05 Cough: Secondary | ICD-10-CM | POA: Insufficient documentation

## 2015-06-15 DIAGNOSIS — K228 Other specified diseases of esophagus: Secondary | ICD-10-CM | POA: Insufficient documentation

## 2015-06-15 DIAGNOSIS — R600 Localized edema: Secondary | ICD-10-CM | POA: Diagnosis not present

## 2015-06-15 DIAGNOSIS — Z9884 Bariatric surgery status: Secondary | ICD-10-CM | POA: Insufficient documentation

## 2015-06-15 DIAGNOSIS — K219 Gastro-esophageal reflux disease without esophagitis: Secondary | ICD-10-CM | POA: Diagnosis not present

## 2015-06-15 DIAGNOSIS — R059 Cough, unspecified: Secondary | ICD-10-CM

## 2015-06-15 LAB — PULMONARY FUNCTION TEST
DL/VA % PRED: 95 %
DL/VA: 4.14 ml/min/mmHg/L
DLCO UNC % PRED: 91 %
DLCO UNC: 24.72 ml/min/mmHg
FEF 25-75 Post: 2.77 L/sec
FEF 25-75 Pre: 3.23 L/sec
FEF2575-%CHANGE-POST: -14 %
FEF2575-%PRED-POST: 139 %
FEF2575-%Pred-Pre: 162 %
FEV1-%CHANGE-POST: -2 %
FEV1-%Pred-Post: 113 %
FEV1-%Pred-Pre: 116 %
FEV1-Post: 3.02 L
FEV1-Pre: 3.11 L
FEV1FVC-%Change-Post: -3 %
FEV1FVC-%PRED-PRE: 113 %
FEV6-%Change-Post: 0 %
FEV6-%Pred-Post: 109 %
FEV6-%Pred-Pre: 108 %
FEV6-POST: 3.77 L
FEV6-Pre: 3.74 L
FEV6FVC-%PRED-POST: 106 %
FEV6FVC-%Pred-Pre: 106 %
FVC-%Change-Post: 0 %
FVC-%PRED-POST: 102 %
FVC-%PRED-PRE: 101 %
FVC-POST: 3.77 L
FVC-PRE: 3.74 L
POST FEV1/FVC RATIO: 80 %
PRE FEV6/FVC RATIO: 100 %
Post FEV6/FVC ratio: 100 %
Pre FEV1/FVC ratio: 83 %
RV % PRED: 138 %
RV: 3.15 L
TLC % PRED: 111 %
TLC: 6.96 L

## 2015-06-15 IMAGING — CT CT MAXILLOFACIAL W/O CM
3 series · 16 of 47 positions shown, 19 images · non-contrast
Comparison: None.

CLINICAL DATA: Cough

EXAM:
CT MAXILLOFACIAL WITHOUT CONTRAST
TECHNIQUE: Multidetector CT imaging of the maxillofacial structures was
performed. Multiplanar CT image reconstructions were also generated.
A small metallic BB was placed on the right temple in order to
reliably differentiate right from left.

[Series 3: maxillofacial 2.0 h32s · axial · 0.35mm/px · z∈[-183,-33]mm · 10 of 87 slices shown, 13 images]
[im 6/87  brain]
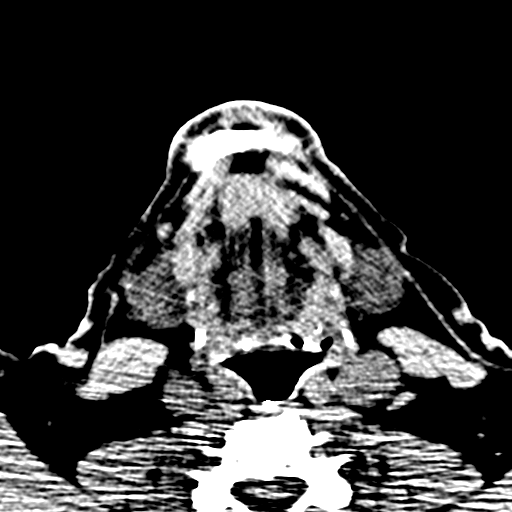
[im 6/87  bone]
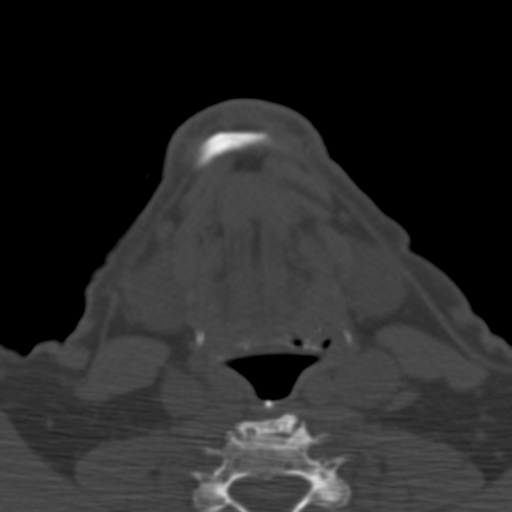
[im 15/87  bone]
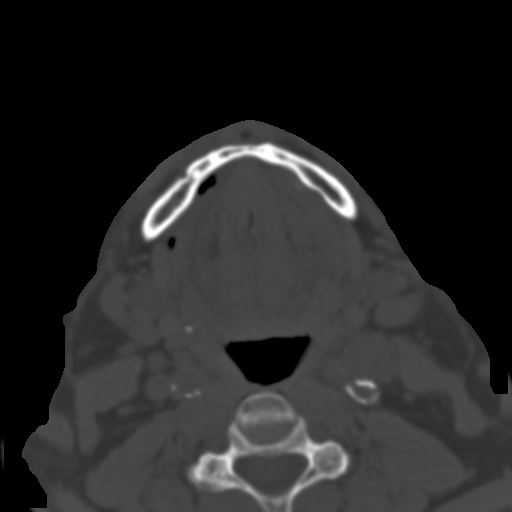
[im 24/87  bone]
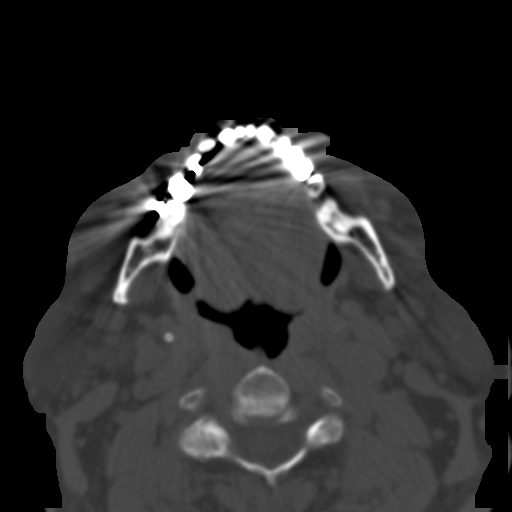
[im 30/87  bone]
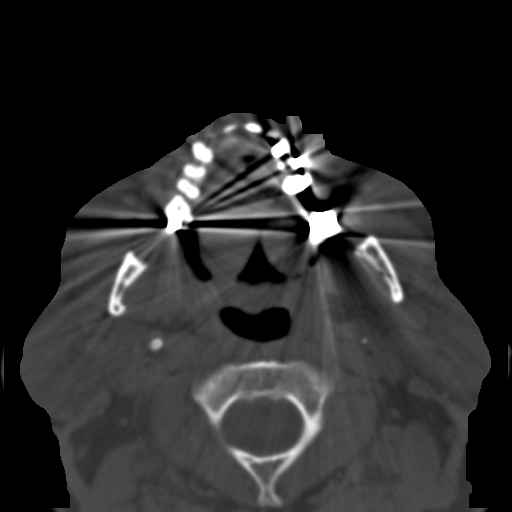
[im 39/87  brain]
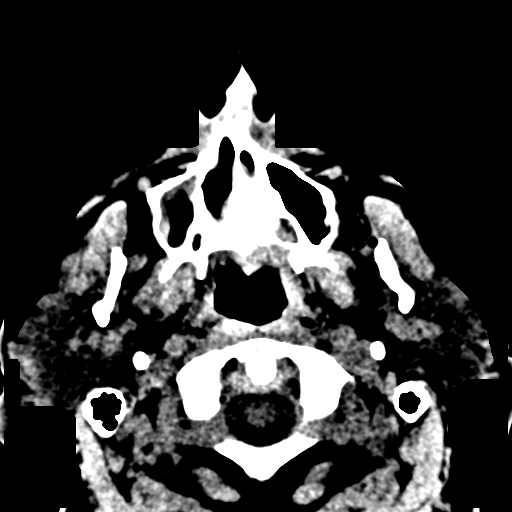
[im 39/87  bone]
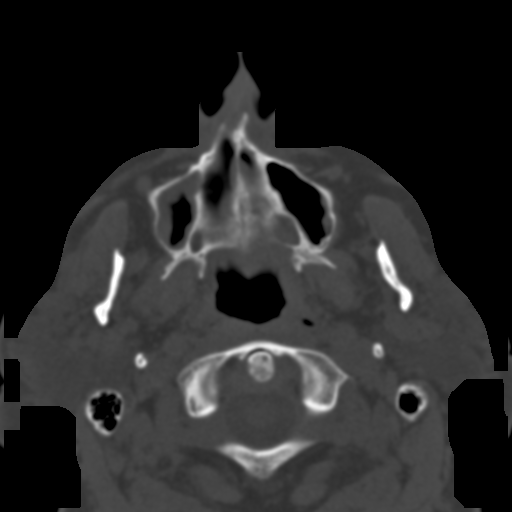
[im 48/87  bone]
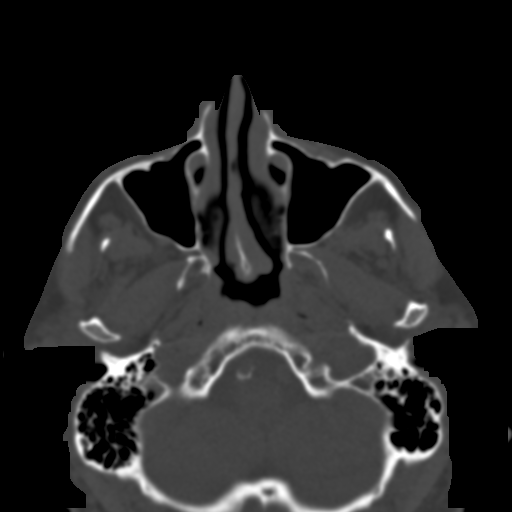
[im 57/87  bone]
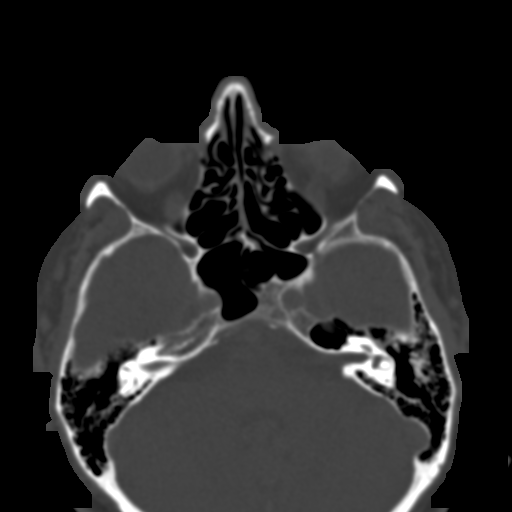
[im 66/87  bone]
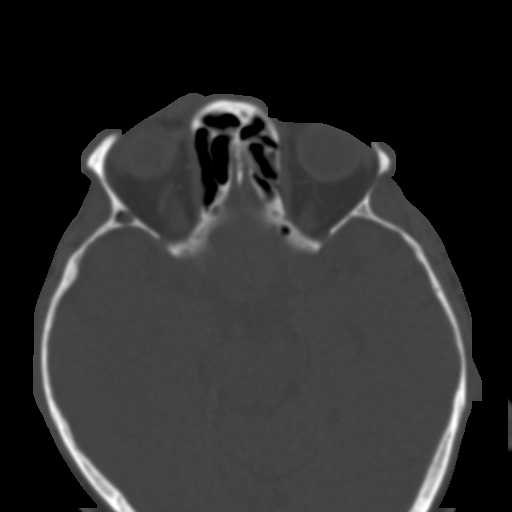
[im 72/87  brain]
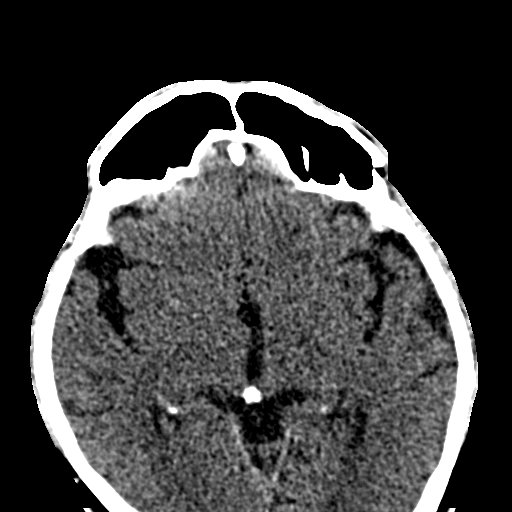
[im 72/87  bone]
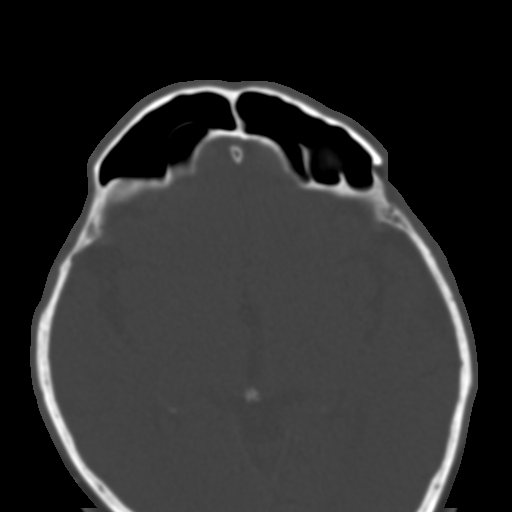
[im 81/87  bone]
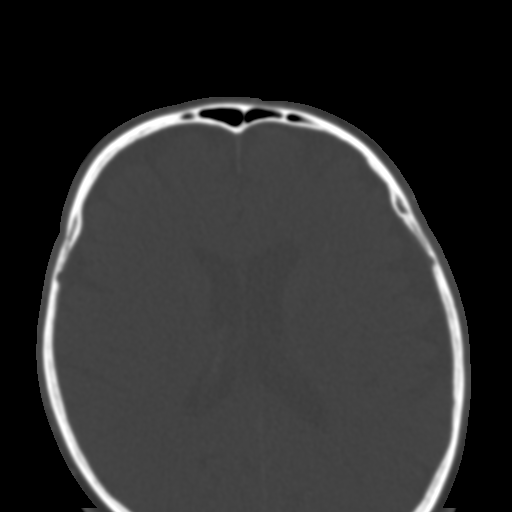

[Series 604: <mpr thick range(2)> · coronal · 0.35mm/px · 3 of 62 slices shown]
[im 21/62  bone]
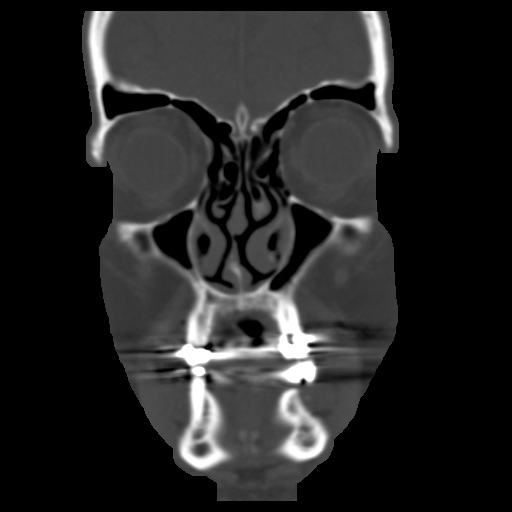
[im 28/62  bone]
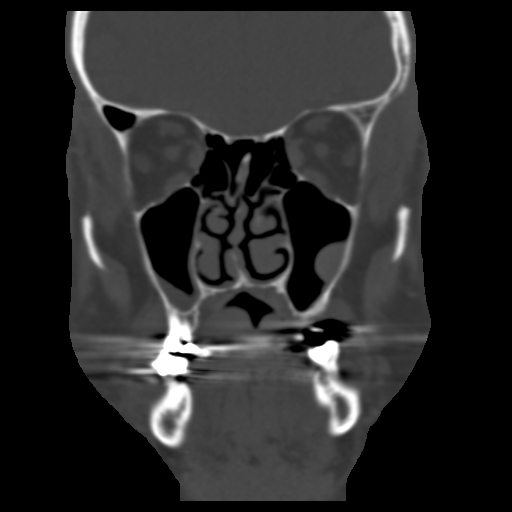
[im 34/62  bone]
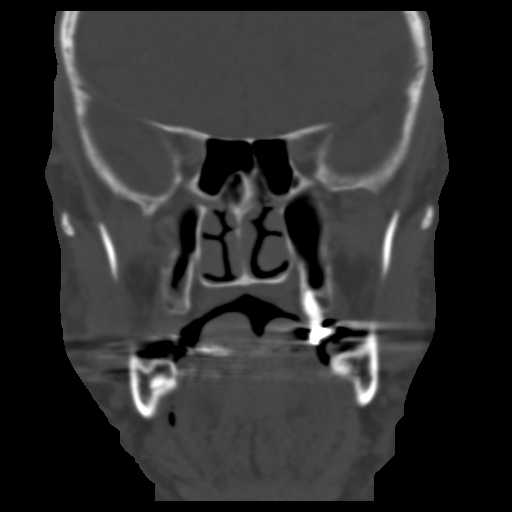

[Series 605: <mpr thick range(3)> · sagittal · 0.35mm/px · 3 of 88 slices shown]
[im 30/88  bone]
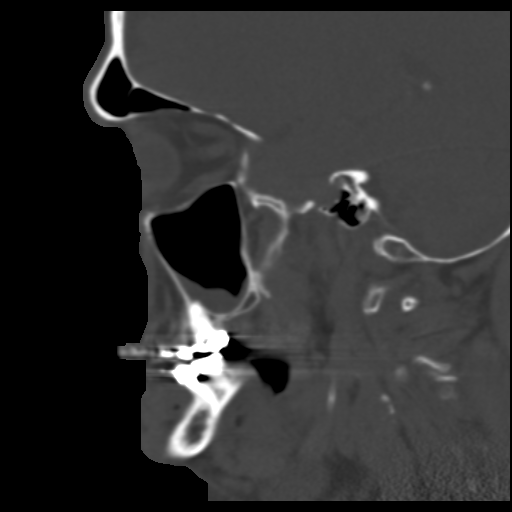
[im 44/88  bone]
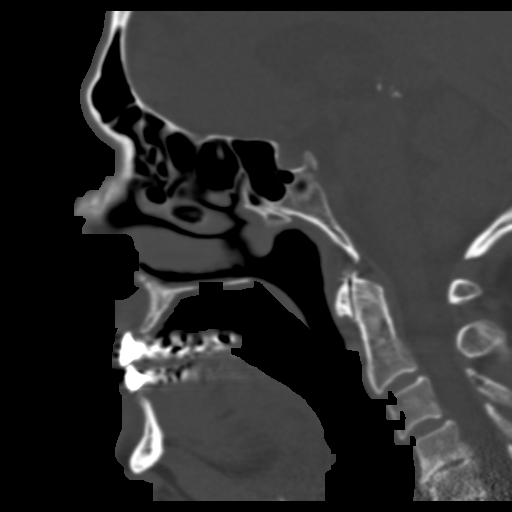
[im 59/88  bone]
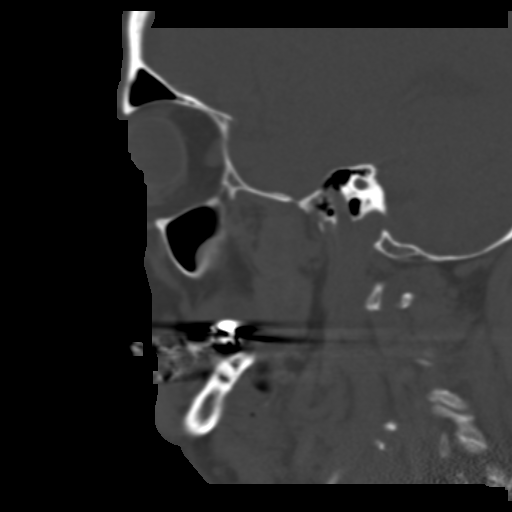

[16 of 47 positions shown; findings below may reference images not displayed]

FINDINGS: Mild mucosal edema throughout the paranasal sinuses. Mild mucosal
thickening in the frontal and ethmoid sinuses and bilateral
maxillary sinus. Sphenoid sinus clear. Mild mucosal edema narrowing
the ostiomeatal complex bilaterally.

Nasal septum deviated to the right.  No bony destruction.

Normal soft tissues.
IMPRESSION: Mild mucosal edema throughout the paranasal sinuses without
air-fluid level. Narrowing of the ostiomeatal complex bilaterally
due to mucosal edema.

## 2015-06-15 IMAGING — RF DG ESOPHAGUS
12 of 15 series · 14 of 24 positions shown · non-contrast
Comparison: Chest radiographs [DATE] and [DATE]

CLINICAL DATA: 70-year-old male with unexplained cough for 8-10
months. Patient reports gastric bypass surgery for weight loss about
12 years ago [place] [facility] Initial encounter.

EXAM:
ESOPHOGRAM / BARIUM SWALLOW / BARIUM TABLET STUDY
TECHNIQUE: Combined double contrast and single contrast examination performed
using effervescent crystals, thick barium liquid, and thin barium
liquid. The patient was observed with fluoroscopy swallowing a 13 mm
barium sulphate tablet.
FLUOROSCOPY TIME:  Radiation Exposure Index (as provided by the
fluoroscopic device): 38.7 mGy
If the device does not provide the exposure index:
Fluoroscopy Time:  2 minutes 24 seconds

[Series 1: cp_standard · 0.53mm/px · 1 of 170 frames shown (1 of 12)]
[frame 1/170]
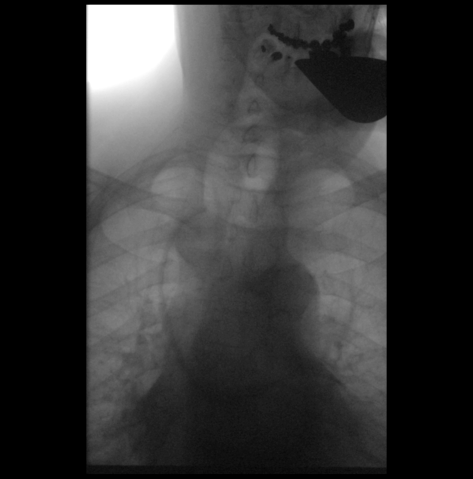

[Series 2: cp_standard · 0.26mm/px · 1 of 1 slices shown (2 of 12)]
[im 1/1]
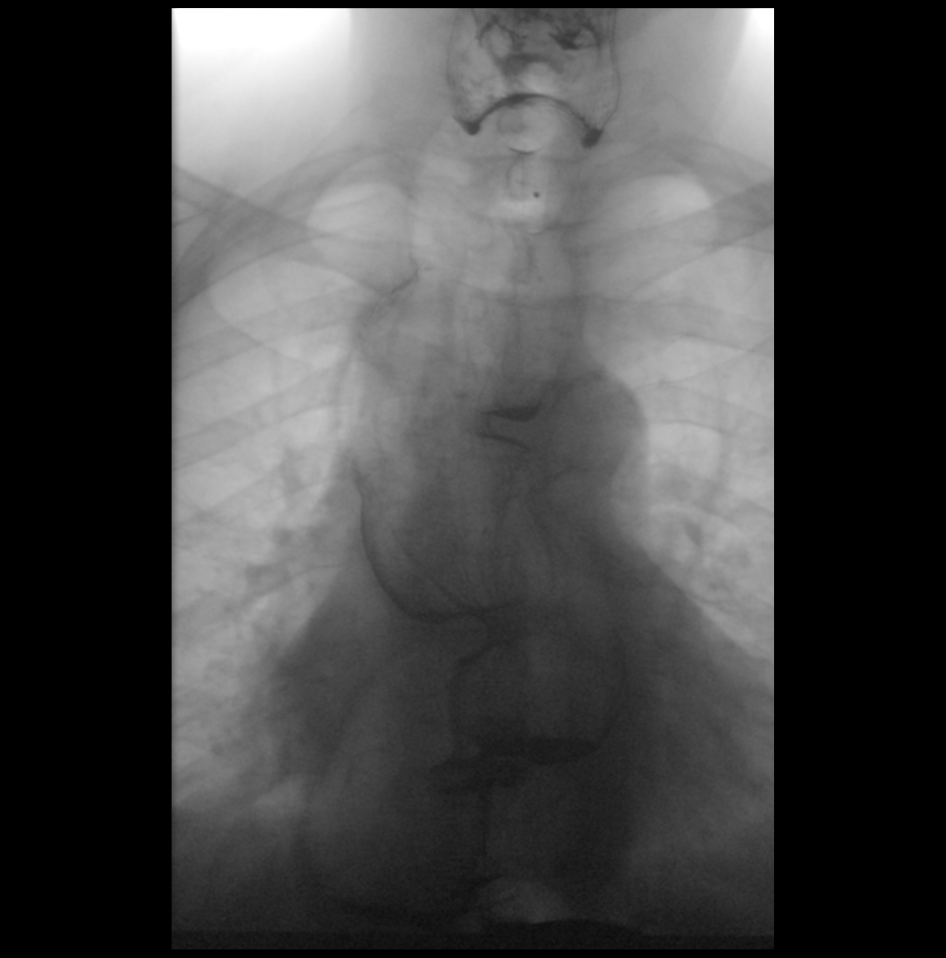

[Series 5: cp_standard · 0.53mm/px · 1 of 65 frames shown (3 of 12)]
[frame 27/65]
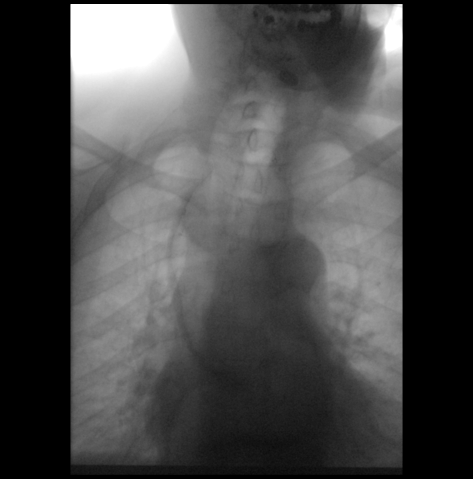

[Series 6: cp_standard · 0.35mm/px · 2 of 53 frames shown (4 of 12)]
[frame 27/53]
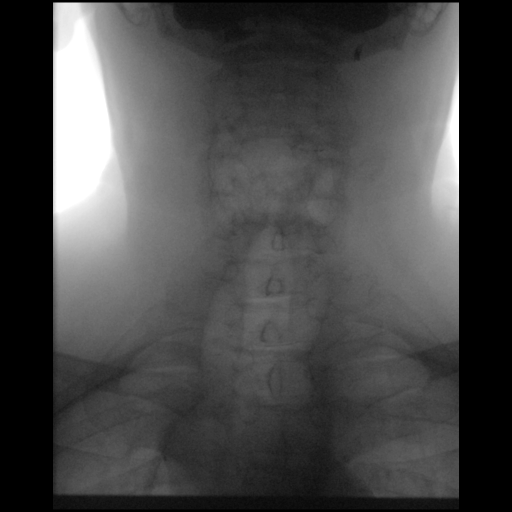
[frame 46/53]
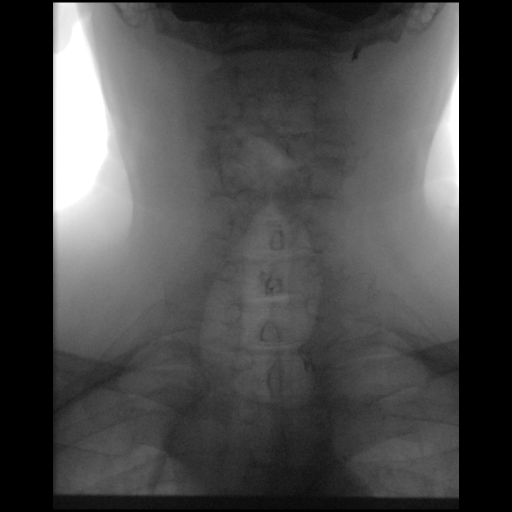

[Series 7: cp_standard · 0.35mm/px · 1 of 46 frames shown (5 of 12)]
[frame 40/46]
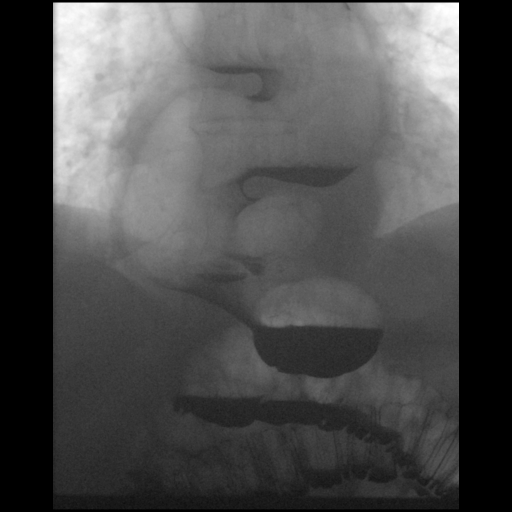

[Series 8: cp_standard · 0.35mm/px · 1 of 81 frames shown (6 of 12)]
[frame 69/81]
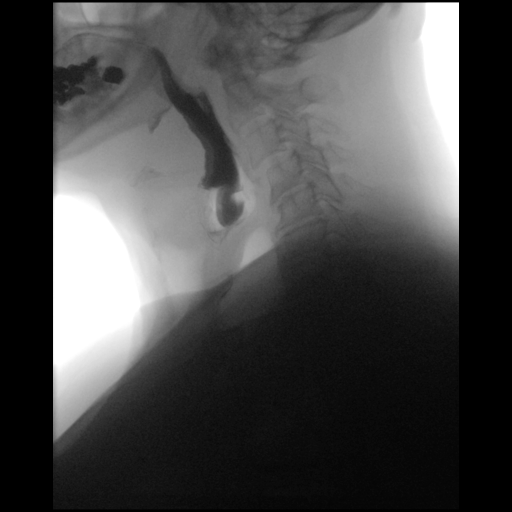

[Series 9: cp_standard · 0.54mm/px · 1 of 53 frames shown (7 of 12)]
[frame 32/53]
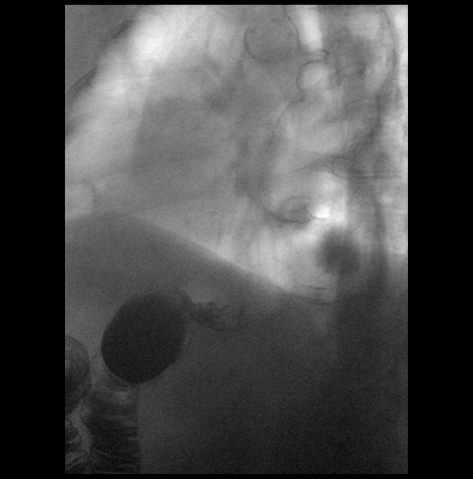

[Series 10: cp_standard · 0.54mm/px · 1 of 144 frames shown (8 of 12)]
[frame 123/144]
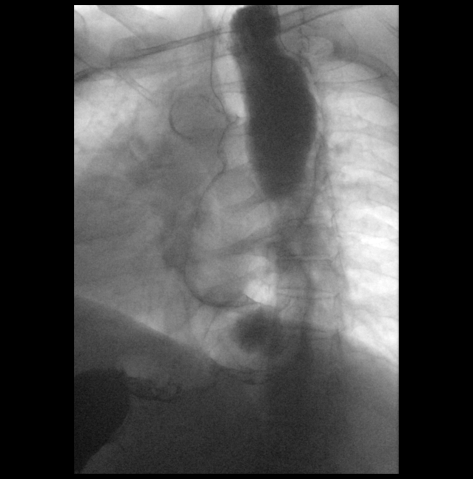

[Series 13: cp_standard · 0.54mm/px · 1 of 68 frames shown (9 of 12)]
[frame 11/68]
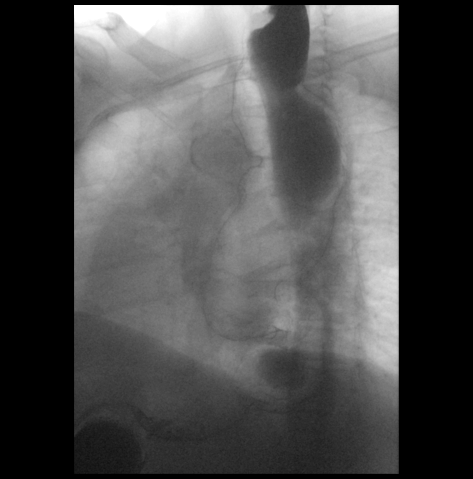

[Series 14: cp_standard · 0.53mm/px · 2 of 64 frames shown (10 of 12)]
[frame 4/64]
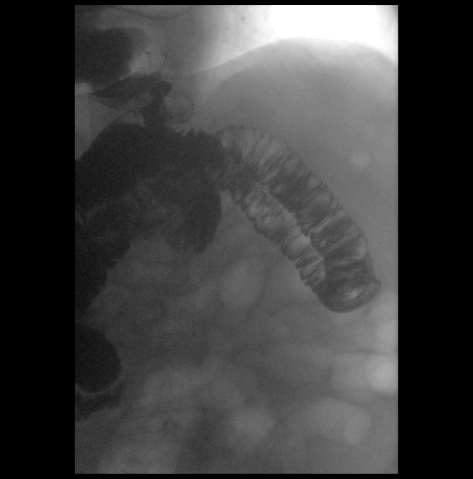
[frame 33/64]
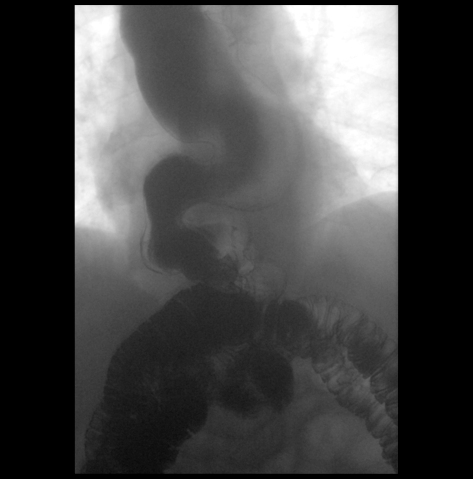

[Series 15: cp_standard · 0.53mm/px · 1 of 126 frames shown (11 of 12)]
[frame 108/126]
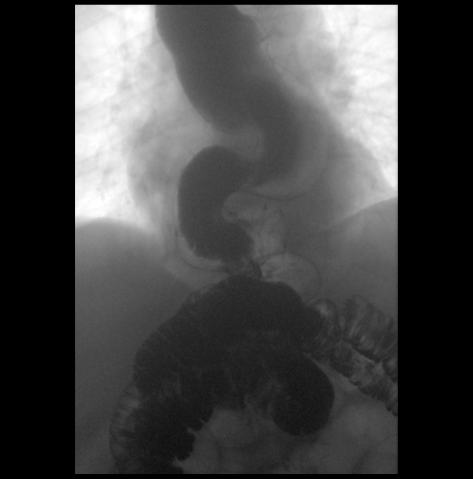

[Series 18: cp_standard · 0.27mm/px · 1 of 1 slices shown (12 of 12)]
[im 1/1]
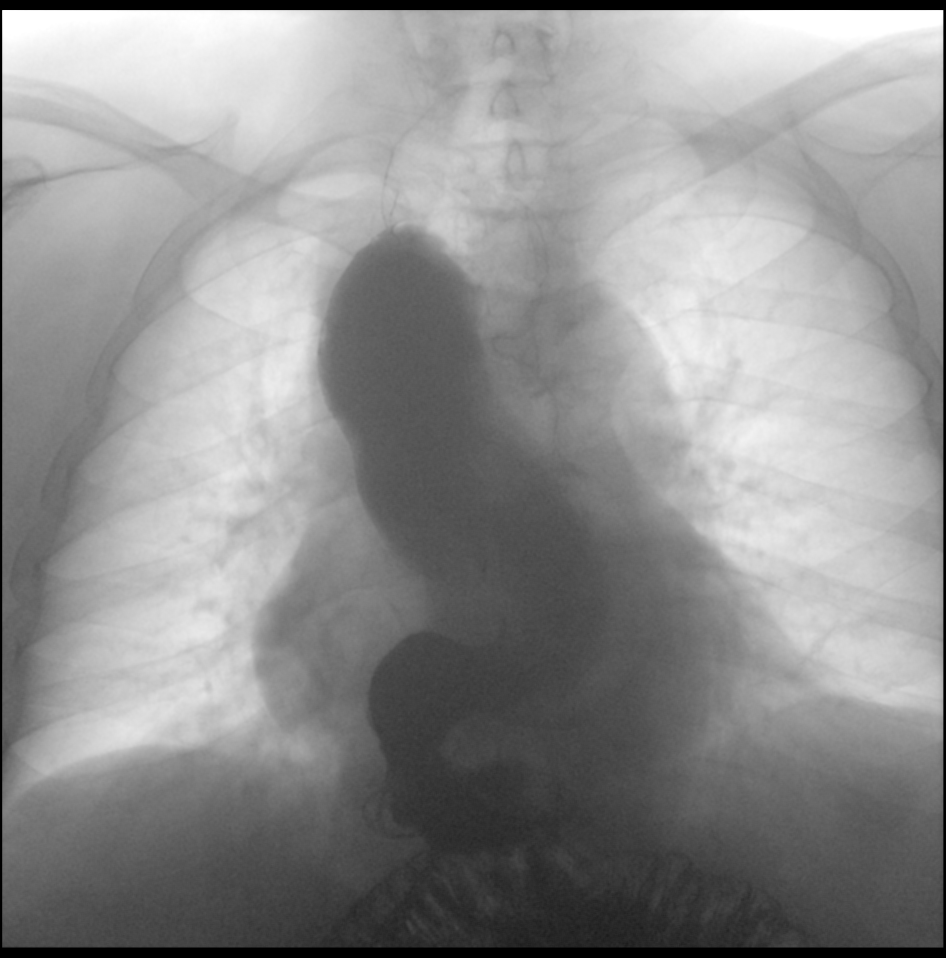

[14 of 24 positions shown; findings below may reference images not displayed]

FINDINGS: As seen on the patient chest radiographs this year the esophagus is
markedly dilated and tortuous, with a corkscrew type configuration.

A double contrast study was undertaken and the patient tolerated
this well and without difficulty.

With swallowing there is absent esophageal peristalsis noted. Barium
traverses the abnormal esophagus in accordance with gravity, but
otherwise with no obstruction to the gastric remnant. Prompt
contrast transit through a gastrojejunostomy is demonstrated, as
well as prompt transit of contrast tube multiple small bowel loops.

A 12.5 mm barium tablet was administered and passed freely to the
stomach without delay.

The proximal aspect of the dilated thoracic esophagus occupies the
posterior thoracic inlet with mild mass effect on the lower cervical
esophagusFurthermore, there is concentric narrowing of the cervical
esophagus at the C6 level (as seen on series 8, image 73). However,
no obstruction occurred, including no hindrance of the 12.5 mm
barium tablet. Furthermore, no aspiration occurred during the exam.

With prone swallows there is absent esophageal motility, and the
esophagus did not empty while prone. The patient was brought back to
45 degrees upright, which facilitated emptying of the stomach. When
the patient was returned prone spontaneous gastroesophageal reflux
occurred to the level of the thoracic inlet (series 18).
IMPRESSION: 1. Abnormal markedly dilated and fixed esophagus with absent
esophageal contractions/motility.
2. Associated gastroesophageal reflux.
3. Some associated mass effect from the proximal dilated esophagus
on the lower cervical esophagus with concentric narrowing at the
junction of the cervical and thoracic esophagus, however, at no
functional obstruction noted, and the barium tablet passed without
difficulty.
4. Superimposed changes of gastric bypass/gastrojejunostomy.
5. The findings were reviewed at the conclusion of this study with
the patient. This esophageal abnormality would seem to be very
longstanding, and it is unclear whether this is related to the
patient cough symptoms.
As far as the underlying etiology, the top differential
considerations would include connective tissue disease and sequelae
of chronic severe esophageal spasm.

## 2015-06-15 MED ORDER — ALBUTEROL SULFATE (2.5 MG/3ML) 0.083% IN NEBU
2.5000 mg | INHALATION_SOLUTION | Freq: Once | RESPIRATORY_TRACT | Status: AC
  Administered 2015-06-15: 2.5 mg via RESPIRATORY_TRACT

## 2015-06-18 NOTE — Progress Notes (Signed)
Quick Note:  ATC pt - went directly to VM. Received msg that VM is full and unable to leave msg at this time. WCB. ______

## 2015-06-19 ENCOUNTER — Other Ambulatory Visit: Payer: Self-pay | Admitting: *Deleted

## 2015-06-19 DIAGNOSIS — K219 Gastro-esophageal reflux disease without esophagitis: Secondary | ICD-10-CM

## 2015-06-21 ENCOUNTER — Ambulatory Visit: Payer: Medicare Other | Admitting: Pulmonary Disease

## 2015-06-28 ENCOUNTER — Telehealth: Payer: Self-pay | Admitting: Pulmonary Disease

## 2015-06-28 NOTE — Telephone Encounter (Signed)
Spoke with pt, he is following up on GI referral.  I advised that per referral notes GI tried to call him last week to schedule OV but pt's VM was full.  Gave pt the # to GI.  Nothing further needed.

## 2015-07-03 ENCOUNTER — Encounter: Payer: Self-pay | Admitting: Gastroenterology

## 2015-07-27 ENCOUNTER — Ambulatory Visit (INDEPENDENT_AMBULATORY_CARE_PROVIDER_SITE_OTHER): Payer: Medicare Other | Admitting: Pulmonary Disease

## 2015-07-27 ENCOUNTER — Encounter: Payer: Self-pay | Admitting: Pulmonary Disease

## 2015-07-27 VITALS — BP 120/60 | HR 87 | Ht 66.0 in | Wt 240.6 lb

## 2015-07-27 DIAGNOSIS — K219 Gastro-esophageal reflux disease without esophagitis: Secondary | ICD-10-CM

## 2015-07-27 DIAGNOSIS — R05 Cough: Secondary | ICD-10-CM

## 2015-07-27 DIAGNOSIS — J309 Allergic rhinitis, unspecified: Secondary | ICD-10-CM | POA: Diagnosis not present

## 2015-07-27 DIAGNOSIS — K224 Dyskinesia of esophagus: Secondary | ICD-10-CM | POA: Insufficient documentation

## 2015-07-27 DIAGNOSIS — R059 Cough, unspecified: Secondary | ICD-10-CM

## 2015-07-27 NOTE — Progress Notes (Signed)
Subjective:    Patient ID: Ian PoleDavid Durnin, male    DOB: 1945-12-05, 70 y.o.   MRN: [id number]  C.C.:  Follow-up for Cough, GERD, & Allergic Rhinitis.  HPI Cough: Likely multifactorial. No evidence of airway obstruction on spirometry today. Her reports he still has a mild, intermittent cough.   GERD: Dilated esophagus with significant reflux and absent contractions. Patient referred to GI after abnormal barium swallow. He denies any reflux or dyspepsia. He reports intermittent dysphagia that is brief. Doesn't reflux food or stomach contents into his throat. Has an appointment in August with GI. Continuing to take Prilosec once daily.   Allergic Rhinitis: Flonase held at last appointment. He does report some occasional post-nasal drainage and congestion.   Review of Systems No fever, chills, or sweats. No chest pain or pressure. No rashes or abnormal bruising.   Allergies  Allergen Reactions  . Sulfa Antibiotics Other (See Comments)    Current Outpatient Prescriptions on File Prior to Visit  Medication Sig Dispense Refill  . atorvastatin (LIPITOR) 40 MG tablet Take 40 mg by mouth daily.    . B Complex Vitamins (VITAMIN-B COMPLEX PO) Take 1 tablet by mouth daily.    . carvedilol (COREG) 6.25 MG tablet Take 6.25 mg by mouth 2 (two) times daily with a meal.    . clopidogrel (PLAVIX) 75 MG tablet Take 75 mg by mouth daily.    Marland Kitchen. co-enzyme Q-10 50 MG capsule Take 50 mg by mouth daily.    . ferrous sulfate 325 (65 FE) MG tablet Take 325 mg by mouth daily with breakfast.    . HYDROcodone-homatropine (HYCODAN) 5-1.5 MG/5ML syrup Take 5 mLs by mouth at bedtime as needed for cough. Reported on 07/27/2015    . Multiple Vitamin (MULTIVITAMIN WITH MINERALS) TABS tablet Take 1 tablet by mouth daily.    Marland Kitchen. olmesartan-hydrochlorothiazide (BENICAR HCT) 20-12.5 MG tablet Take 1 tablet by mouth daily.     Marland Kitchen. omeprazole (PRILOSEC) 40 MG capsule Take 40 mg by mouth daily.    . ranolazine (RANEXA) 500 MG 12 hr  tablet Take 250 mg by mouth 2 (two) times daily.      No current facility-administered medications on file prior to visit.    Past Medical History  Diagnosis Date  . Hypertension   . Allergic rhinitis   . GERD (gastroesophageal reflux disease)   . Coronary artery disease     Stent x1 2008    Past Surgical History  Procedure Laterality Date  . Abdominal surgery    . Cardiac surgery      stent  . Total knee arthroplasty      b/l  . Rotator cuff repair Left   . Gastric bypass  2005  . Coronary angioplasty with stent placement  2008    stent x1    Family History  Problem Relation Age of Onset  . Breast cancer Mother   . Diabetes Mother   . Prostate cancer Father   . Breast cancer Sister   . Diabetes Sister   . Lung disease Neg Hx     Social History   Social History  . Marital Status: Married    Spouse Name: N/A  . Number of Children: N/A  . Years of Education: N/A   Social History Main Topics  . Smoking status: Former Smoker -- 1.00 packs/day for 18 years    Types: Cigarettes    Start date: 02/09/1964    Quit date: 05/23/1982  . Smokeless tobacco: Never Used  Comment: Also smoked 4-5 cigars for 10 years after cigarettes  . Alcohol Use: 0.0 oz/week    0 Standard drinks or equivalent per week     Comment: Rare  . Drug Use: No  . Sexual Activity: Not Asked   Other Topics Concern  . None   Social History Narrative   Originally from [place]. Has lived in [place], [place]. Previously has traveled to [place], [place], [place], & on [place] cruises. Currently works as a Designer, fashion/clothing. No pets currently. Remote bird exposure. No mold or hot tub exposure. No known asbestos exposure.       Objective:   Physical Exam BP 120/60 mmHg  Pulse 87  Ht  (1.676 m)  Wt 240 lb 9.6 oz (109.135 kg)  BMI 38.85 kg/m2  SpO2 96% General:  Awake. Alert. Morbid obesity.  Integument:  Warm & dry. No rash on exposed skin.  Lymphatics:  No appreciated cervical or  supraclavicular lymphadenoapthy. HEENT:  Moist mucus membranes. No oral ulcers. Minimal bilateral nasal turbinate swelling right greater than left. Cardiovascular:  Regular rate. Normal S1 & S2. Trace bilateral lower extremity edema. Pulmonary:  Clear to auscultation bilaterally. Normal work of breathing on room air. Speaking in complete sentences. Musculoskeletal:  Normal bulk and tone. No joint deformity or effusion appreciated.  PFT 06/15/15: FVC 3.74 L (101%) FEV1 3.11 L (116%) FEV1/FVC 0.83 FEF 25-75 3.23 L (162%) no bronchodilator response TLC 6.96 L (111%) RV 138% ERV 54% DLC uncorrected 91%  IMAGING MAXILLOFACIAL CT W/O 06/15/15 (per radiologist): Mild mucosal edema throughout paranasal sinuses without air-fluid level. Narrowing of ostiomeatal complex bilaterally due to mucosal edema.  BARIUM SWALLOW 06/15/15 (per radiologist): Markedly dilated and fixed esophagus with absent esophageal contractions/motility. Associated gastroesophageal reflux. Some associated mass effect from proximal dilated esophagus on lower cervical esophagus with concentric narrowing at the junction of the cervical and thoracic esophagus. No functional obstruction noted. Changes of gastric bypass/gastrojejunostomy noted.  CXR PA/LAT 05/14/15 (previously reviewed by me): Hiatal hernia noted. No parenchymal nodule or opacity. Persistent elevation of left hemidiaphragm. Heart normal in size & mediastinum otherwise normal in contour. No pleural effusion.  LABS 05/08/15 Troponin I: 0.00 CBC: 20.7/13.3/40.8/313 BMP: 142/4.0/108/25/16/1.09/83/9.3    Assessment & Plan:  70 year old male with history of recurrent cough That is gradually resolving. Suspect ongoing cough is secondary to a combination of not only his allergic rhinitis which appears mild on CT imaging but also his underlying reflux. Reviewed patient's already function testing today which showed no evidence of airway obstruction to suggest COPD. As such, the patient  can continue to abstain from inhaled medication therapy. I instructed the patient to notify my office if he had any worsening in his cough or new breathing problems before next appointment.  1. Cough: Multifactorial and likely secondary to both allergic rhinitis & GERD with esophageal dysmotility. 2. 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 Continuing Prilosec. Patient has an appointment for evaluation by GI on 8/2. Patient instructed to avoid eating within 2 hours of bedtime & elevate the head of his bed 3-5 inches. 3. Allergic Rhinitis: Mild based on maxillofacial CT scan. Recommended trying over-the-counter antihistamine medication or resuming previous Flonase. 4. Follow-up: Patient to return to clinic in 3 months or sooner if needed.  Donna Christen Jamison Neighbor, M.D. [facility] Pulmonary & Critical Care Pager:  [phone] After 3pm or if no response, call [phone] 4:14 PM 07/27/2015

## 2015-07-27 NOTE — Patient Instructions (Signed)
   You can try going back on Flonase, Zyrtec, Allegra, or Claritin to help with your sinus allergies.  I will see you back in 3 months. Please call me if you have any new breathing problems or worsening in your cough before then.

## 2015-08-21 ENCOUNTER — Ambulatory Visit (INDEPENDENT_AMBULATORY_CARE_PROVIDER_SITE_OTHER)
Admission: RE | Admit: 2015-08-21 | Discharge: 2015-08-21 | Disposition: A | Discharge status: Home / Self Care | Payer: Medicare Other | Source: Ambulatory Visit | Attending: Pulmonary Disease | Admitting: Pulmonary Disease

## 2015-08-21 ENCOUNTER — Encounter: Payer: Self-pay | Admitting: Pulmonary Disease

## 2015-08-21 ENCOUNTER — Ambulatory Visit (INDEPENDENT_AMBULATORY_CARE_PROVIDER_SITE_OTHER): Payer: Medicare Other | Admitting: Pulmonary Disease

## 2015-08-21 ENCOUNTER — Other Ambulatory Visit (INDEPENDENT_AMBULATORY_CARE_PROVIDER_SITE_OTHER): Payer: Medicare Other

## 2015-08-21 VITALS — BP 118/76 | HR 82 | Ht 66.0 in | Wt 242.2 lb

## 2015-08-21 DIAGNOSIS — J309 Allergic rhinitis, unspecified: Secondary | ICD-10-CM

## 2015-08-21 DIAGNOSIS — R05 Cough: Secondary | ICD-10-CM

## 2015-08-21 DIAGNOSIS — R059 Cough, unspecified: Secondary | ICD-10-CM

## 2015-08-21 DIAGNOSIS — K219 Gastro-esophageal reflux disease without esophagitis: Secondary | ICD-10-CM | POA: Diagnosis not present

## 2015-08-21 LAB — CBC WITH DIFFERENTIAL/PLATELET
BASOS PCT: 0.2 % (ref 0.0–3.0)
Basophils Absolute: 0 10*3/uL (ref 0.0–0.1)
EOS ABS: 0.3 10*3/uL (ref 0.0–0.7)
Eosinophils Relative: 3.7 % (ref 0.0–5.0)
HEMATOCRIT: 33.7 % — AB (ref 39.0–52.0)
Hemoglobin: 11.1 g/dL — ABNORMAL LOW (ref 13.0–17.0)
LYMPHS PCT: 22.8 % (ref 12.0–46.0)
Lymphs Abs: 1.9 10*3/uL (ref 0.7–4.0)
MCHC: 32.8 g/dL (ref 30.0–36.0)
MCV: 80.6 fl (ref 78.0–100.0)
MONOS PCT: 11.7 % (ref 3.0–12.0)
Monocytes Absolute: 1 10*3/uL (ref 0.1–1.0)
NEUTROS ABS: 5.2 10*3/uL (ref 1.4–7.7)
Neutrophils Relative %: 61.6 % (ref 43.0–77.0)
PLATELETS: 267 10*3/uL (ref 150.0–400.0)
RBC: 4.19 Mil/uL — ABNORMAL LOW (ref 4.22–5.81)
RDW: 17.4 % — AB (ref 11.5–15.5)
WBC: 8.4 10*3/uL (ref 4.0–10.5)

## 2015-08-21 IMAGING — DX DG CHEST 2V
2 series · 2 of 2 positions shown · non-contrast
Comparison: [DATE]

CLINICAL DATA: Coughing congestion for 5 days.

EXAM:
CHEST  2 VIEW

[chest pa]
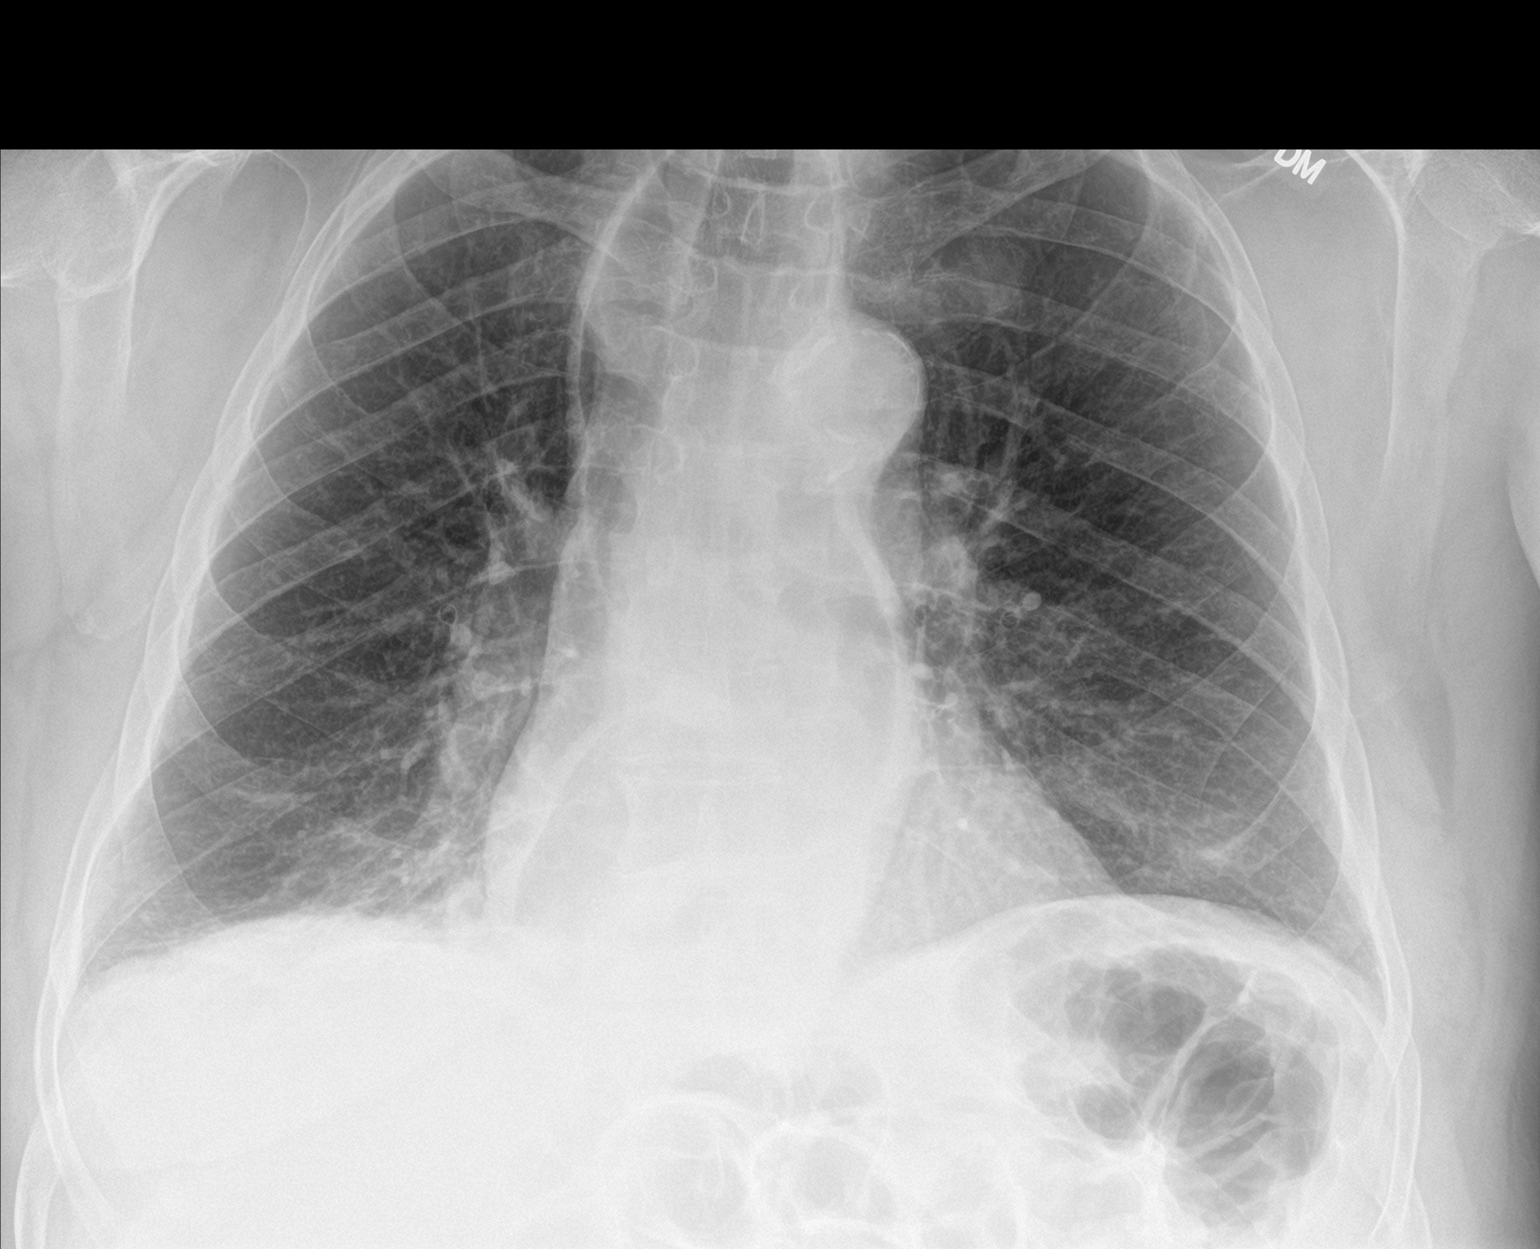

[chest lat]
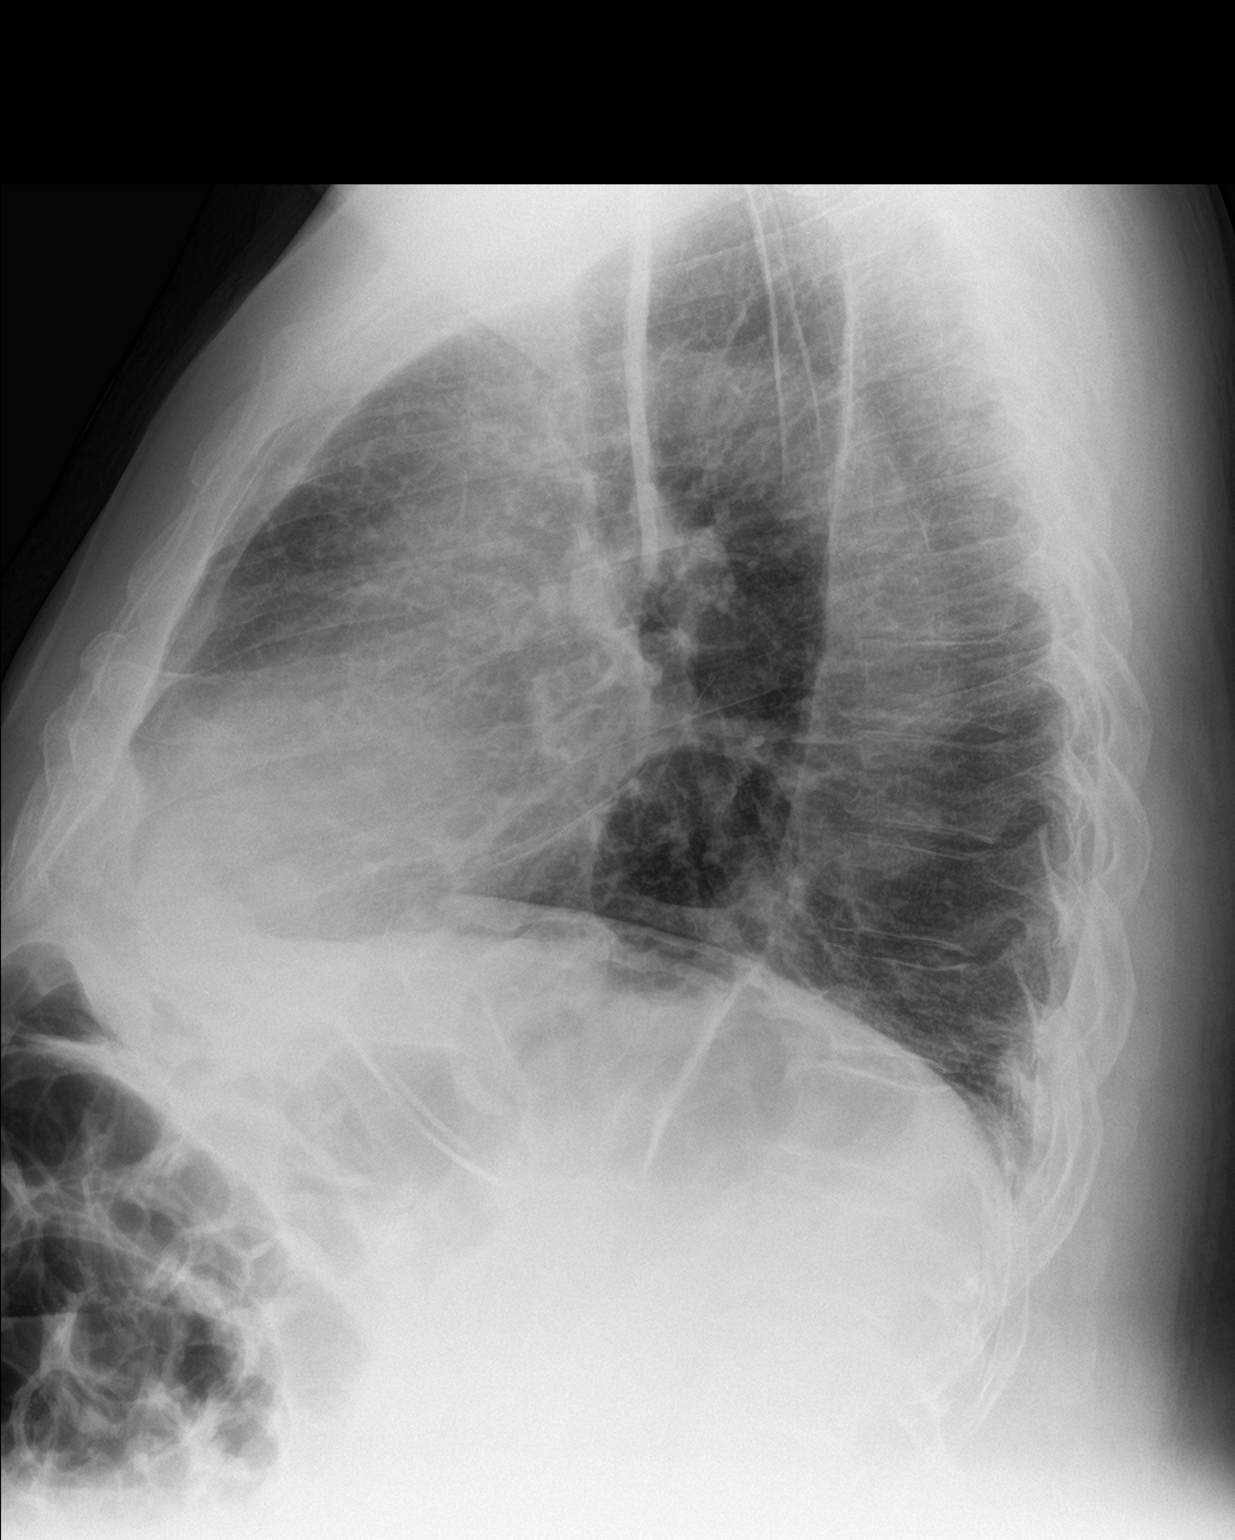

[2 of 2 positions shown; findings below may reference images not displayed]

FINDINGS: Interval development of subtle patchy airspace disease in the lung
bases. Cardiopericardial silhouette is at upper limits of normal for
size. Diffusely dilated esophagus again noted, better evaluated on
esophagram of [DATE]. The visualized bony structures of the
thorax are intact.
IMPRESSION: Interval development of bibasilar densities which may be atelectatic
although pneumonia is not excluded.

## 2015-08-21 MED ORDER — FLUTICASONE FUROATE-VILANTEROL 200-25 MCG/INH IN AEPB: 1.0000 | INHALATION_SPRAY | Freq: Every day | RESPIRATORY_TRACT | 0 refills | Status: DC

## 2015-08-21 MED ORDER — BENZONATATE 100 MG PO CAPS: 100.0000 mg | ORAL_CAPSULE | Freq: Three times a day (TID) | ORAL | 1 refills | Status: DC | PRN

## 2015-08-21 MED ORDER — LEVOFLOXACIN 500 MG PO TABS: 500.0000 mg | ORAL_TABLET | Freq: Every day | ORAL | 0 refills | Status: DC

## 2015-08-21 NOTE — Progress Notes (Signed)
Subjective:    Patient ID: Ian PoleDavid [name], male    DOB: 1945-02-13, 70 y.o.   MRN: [id number]  C.C.:  Follow-up for Cough, GERD, & Allergic Rhinitis.  HPI Cough: Likely multifactorial. He reports he noticed more "hacking" cough starting last week. Cough produces a clear white mucus but this is also green in the morning. He reports it worsened significantly on Saturday. He has been using cough syrup to help with his cough.   GERD w/ Dilated Esophagus:  On Prilosec. Referred to GI but has yet to see in consultation. Has an appointment with Dr. Adela LankArmbruster on 8/24. Denies any reflux.  Allergic Rhinitis:  Restarted Flonase at last appointment after maxillofacial CT scan result & recommended over-the-counter antihistamine medication. He reports continued sinus congestion  Review of Systems No fever, chills, or sweats. He reports he had some tightness yesterday that has improved today. No chest pain otherwise. No nausea or emesis.   Allergies  Allergen Reactions  . Sulfa Antibiotics Other (See Comments)    Current Outpatient Prescriptions on File Prior to Visit  Medication Sig Dispense Refill  . atorvastatin (LIPITOR) 40 MG tablet Take 40 mg by mouth daily.    . B Complex Vitamins (VITAMIN-B COMPLEX PO) Take 1 tablet by mouth daily.    . carvedilol (COREG) 6.25 MG tablet Take 6.25 mg by mouth 2 (two) times daily with a meal.    . clopidogrel (PLAVIX) 75 MG tablet Take 75 mg by mouth daily.    Marland Kitchen. co-enzyme Q-10 50 MG capsule Take 50 mg by mouth daily.    . ferrous sulfate 325 (65 FE) MG tablet Take 325 mg by mouth daily with breakfast.    . Multiple Vitamin (MULTIVITAMIN WITH MINERALS) TABS tablet Take 1 tablet by mouth daily.    Marland Kitchen. olmesartan-hydrochlorothiazide (BENICAR HCT) 20-12.5 MG tablet Take 1 tablet by mouth daily.     Marland Kitchen. omeprazole (PRILOSEC) 40 MG capsule Take 40 mg by mouth daily.    . ranolazine (RANEXA) 500 MG 12 hr tablet Take 250 mg by mouth 2 (two) times daily.     Marland Kitchen.  HYDROcodone-homatropine (HYCODAN) 5-1.5 MG/5ML syrup Take 5 mLs by mouth at bedtime as needed for cough. Reported on 07/27/2015     No current facility-administered medications on file prior to visit.     Past Medical History:  Diagnosis Date  . Allergic rhinitis   . Coronary artery disease    Stent x1 2008  . GERD (gastroesophageal reflux disease)   . Hypertension     Past Surgical History:  Procedure Laterality Date  . ABDOMINAL SURGERY    . CARDIAC SURGERY     stent  . CORONARY ANGIOPLASTY WITH STENT PLACEMENT  2008   stent x1  . GASTRIC BYPASS  2005  . ROTATOR CUFF REPAIR Left   . TOTAL KNEE ARTHROPLASTY     b/l    Family History  Problem Relation Age of Onset  . Breast cancer Mother   . Diabetes Mother   . Prostate cancer Father   . Breast cancer Sister   . Diabetes Sister   . Lung disease Neg Hx     Social History   Social History  . Marital status: Married    Spouse name: N/A  . Number of children: N/A  . Years of education: N/A   Social History Main Topics  . Smoking status: Former Smoker    Packs/day: 1.00    Years: 18.00    Types: Cigarettes  Start date: 02/09/1964    Quit date: 05/23/1982  . Smokeless tobacco: Never Used     Comment: Also smoked 4-5 cigars for 10 years after cigarettes  . Alcohol use 0.0 oz/week     Comment: Rare  . Drug use: No  . Sexual activity: Not Asked   Other Topics Concern  . None   Social History Narrative   Originally from [place]. Has lived in [place], [place]. Previously has traveled to [place], [place], [place], [place], & on [place] cruises. Currently works as a Designer, fashion/clothing. No pets currently. Remote bird exposure. No mold or hot tub exposure. No known asbestos exposure.       Objective:   Physical Exam BP 118/76 (BP Location: Left Arm, Cuff Size: Large)   Pulse 82   Ht  (1.676 m)   Wt 242 lb 3.2 oz (109.9 kg)   SpO2 96%   BMI 39.09 kg/m  General:  Awake. Morbid obesity. Comfortable. Integument:   Warm & dry. No rash on exposed skin.  Lymphatics:  No appreciated cervical or supraclavicular lymphadenoapthy. HEENT:  Moist mucus membranes. Minimal bilateral nasal turbinate swelling slightly improved. No oral ulcers. Cardiovascular:  Regular rate. Normal S1 & S2. Trace bilateral lower extremity edema. Pulmonary:  Coarse wheeze bilaterally. Otherwise good aeration & normal work of breathing on room air. Musculoskeletal:  Normal bulk and tone. No joint deformity or effusion appreciated.  PFT 06/15/15: FVC 3.74 L (101%) FEV1 3.11 L (116%) FEV1/FVC 0.83 FEF 25-75 3.23 L (162%) no bronchodilator response TLC 6.96 L (111%) RV 138% ERV 54% DLC uncorrected 91%  IMAGING MAXILLOFACIAL CT W/O 06/15/15 (per radiologist): Mild mucosal edema throughout paranasal sinuses without air-fluid level. Narrowing of ostiomeatal complex bilaterally due to mucosal edema.  BARIUM SWALLOW 06/15/15 (per radiologist): Markedly dilated and fixed esophagus with absent esophageal contractions/motility. Associated gastroesophageal reflux. Some associated mass effect from proximal dilated esophagus on lower cervical esophagus with concentric narrowing at the junction of the cervical and thoracic esophagus. No functional obstruction noted. Changes of gastric bypass/gastrojejunostomy noted.  CXR PA/LAT 05/14/15 (previously reviewed by me): Hiatal hernia noted. No parenchymal nodule or opacity. Persistent elevation of left hemidiaphragm. Heart normal in size & mediastinum otherwise normal in contour. No pleural effusion.  LABS 05/08/15 Troponin I: 0.00 CBC: 20.7/13.3/40.8/313 BMP: 142/4.0/108/25/16/1.09/83/9.3    Assessment & Plan:  70 y.o. male with history of recurrent cough.I suspect the patient has had an aspiration event that has resulted in acute bronchitis. He would likely benefit from antibiotic therapy. No new symptoms from reflux and is continuing to sleep with his bed elevated. His allergic rhinitis seems to be reasonably  controlled today. I instructed the patient contact my office if he had any new breathing problems or needed a refill of his hydrocodone cough syrup.  1. Cough: Multifactorial. Suspect acute bronchitis now. Treating with Levaquin  daily x7 days. Continuing Hydrocodone cough syrup & prescribing Tessalon Perles for cough relief. Giving Sample of Breo 200 to use for a week. Checking serum CBC & CXR PA/LAT today. 2. GERD w/ Esophageal Dysmotility:  Continuing Prilosec. Has appointment on 8/24 for GI evaluation. Checking SCL-70. 3. Allergic Rhinitis: Continuing OTC anti-histamine & Flonase. No changes. 4. Follow-up: Patient to return to clinic in 4 weeks or sooner if needed.  Donna Christen Jamison Neighbor, M.D. [facility] Pulmonary & Critical Care Pager:  [phone] After 3pm or if no response, call [phone] 12:45 PM 08/21/15

## 2015-08-21 NOTE — Patient Instructions (Addendum)
   Inhale one dose of your Breo daily. Make sure you brush your teeth, brush your tongue, rinse, gargle and spit after to keep from getting thrush.  If you develop any rash, itching, or other adverse effect from the Levaquin stop taking it.  If you start to have any new joint pain, muscle pain, or tenderness in your achilles tendon at your heel stop taking the Levaquin & let me know.   Your sputum culture should be a specimen from first thing in the morning and dropped off to the lab within 4 hours. Do not refrigerate the specimen.  We will call you if your tests are abnormal.  I will see you back in 4 weeks. Call me if you have any questions or problems before then.   TESTS ORDERED: 1. Sputum culture for AFB, Fungus, & Bacteria. 2. CXR PA/LAT Today 3. Serum CBC w/ diff & SCL-70

## 2015-08-22 ENCOUNTER — Telehealth: Payer: Self-pay | Admitting: *Deleted

## 2015-08-22 ENCOUNTER — Other Ambulatory Visit: Payer: Medicare Other

## 2015-08-22 DIAGNOSIS — R05 Cough: Secondary | ICD-10-CM

## 2015-08-22 DIAGNOSIS — R059 Cough, unspecified: Secondary | ICD-10-CM

## 2015-08-22 LAB — ANTI-SCLERODERMA ANTIBODY: Scleroderma (Scl-70) (ENA) Antibody, IgG: <1

## 2015-08-22 NOTE — Telephone Encounter (Signed)
-----   Message from Roslynn AmbleJennings E Nestor, MD sent at 08/21/2015  3:02 PM EDT ----- Please let the patient know that I reviewed his chest x-ray. There is no obvious large pneumonia although there may be a couple patchy areas in his lower lungs that could be indicating some very early changes of bronchitis progressing to pneumonia. Thank you.

## 2015-08-22 NOTE — Telephone Encounter (Signed)
Called spoke with patient, advised of JN's recommendations as stated below.  Pt voiced his understanding.  Advised pt to contact the office if his symptoms do not improve of they worsened.  Pt voiced his understanding of this as well.  Nothing further needed, will sign off.

## 2015-08-22 NOTE — Telephone Encounter (Signed)
Patient notified of results from CXR.  Patient wants to know if there is anything he needs to do to prevent Pneumonia.    Dr. Jamison NeighborNestor, please advise.

## 2015-08-22 NOTE — Telephone Encounter (Signed)
Nothing new. Just continue taking the medications. JN

## 2015-08-27 ENCOUNTER — Telehealth: Payer: Self-pay

## 2015-08-27 NOTE — Telephone Encounter (Signed)
[place] lab called to advise that [facility] and said they cannot perform respiratory culture because sample from pt contains too much saliva. All other tests were completed.   JN - FYI

## 2015-08-28 LAB — RESPIRATORY CULTURE OR RESPIRATORY AND SPUTUM CULTURE

## 2015-08-31 ENCOUNTER — Telehealth: Payer: Self-pay | Admitting: Pulmonary Disease

## 2015-08-31 MED ORDER — AZITHROMYCIN 500 MG PO TABS: 500.0000 mg | ORAL_TABLET | Freq: Every day | ORAL | 0 refills | Status: AC

## 2015-08-31 NOTE — Telephone Encounter (Signed)
lmomtcb x1 for pt 

## 2015-08-31 NOTE — Telephone Encounter (Signed)
Pt called to say he finished 7 days of Levaquin but he still has cough, sputum, fevers, chills, dyspnea. I will call in a course of Azithromycin. If this does not improve symptoms then he will need to come in to be re evaluated.

## 2015-09-03 NOTE — Telephone Encounter (Signed)
Spoke with pt. States that he doesn't need anything from us at this time. Message will be closed.

## 2015-09-06 ENCOUNTER — Encounter: Payer: Self-pay | Admitting: Gastroenterology

## 2015-09-06 ENCOUNTER — Telehealth: Payer: Self-pay

## 2015-09-06 ENCOUNTER — Ambulatory Visit (INDEPENDENT_AMBULATORY_CARE_PROVIDER_SITE_OTHER): Payer: Medicare Other | Admitting: Gastroenterology

## 2015-09-06 VITALS — BP 95/65 | HR 84 | Ht 63.5 in | Wt 236.4 lb

## 2015-09-06 DIAGNOSIS — K219 Gastro-esophageal reflux disease without esophagitis: Secondary | ICD-10-CM

## 2015-09-06 DIAGNOSIS — R933 Abnormal findings on diagnostic imaging of other parts of digestive tract: Secondary | ICD-10-CM | POA: Diagnosis not present

## 2015-09-06 DIAGNOSIS — R05 Cough: Secondary | ICD-10-CM | POA: Diagnosis not present

## 2015-09-06 DIAGNOSIS — R1310 Dysphagia, unspecified: Secondary | ICD-10-CM | POA: Diagnosis not present

## 2015-09-06 DIAGNOSIS — R053 Chronic cough: Secondary | ICD-10-CM

## 2015-09-06 NOTE — Patient Instructions (Signed)

## 2015-09-06 NOTE — Progress Notes (Signed)
HPI :  70 y/o male with a PMH of CAD with coronary stent placement in 2008, HTN, and chronic cough, referred for GERD and abnormal imaging of the esophagus. He has a history of gastric bypass surgery in 2012. Barium swallow done in June showed a markedly dilated esophagus, corkscrew type appearance, with absent peristalsis. There is also concentric narrowing of the cervical esophagus. This imaging was obtained by Dr. Jamison NeighborNestor of pulmonary to further workup the patient's GERD as a possible contributor to chronic cough. Suspected that he has also had an aspiration event.   He endorses occasional dysphagia. He reports to solids only, usually to eating meat. Sometimes odynophagia with swallowing as well. He thinks this has been longstanding. He otherwise endorses some early satiety at times but has had gastric bypass in 2012 and symptoms have been stable since that time.  No vomiting after he eats. He denies much heartburn that bothers him currently. He takes 40mg  omeprazole daily as a prophylactic measure while on aspirin and plavix. He denies regurgitation. He has been on plavix for CAD with stent in place. He denies any problems with chest pains or shortness of breath.   Father had colon cancer, diagnosed at age 380s.   Colonoscopy done within the past year, done in [place] and told it was normal.  He thinks he had a endoscopy remotely a very long time ago, he thinks > 10 years ago, for reflux symptoms.   In regards to his cough, it is sporadic, and productive. He denies any worsening after he eats, and sometimes bothers him at night. It can wake him from sleep.     Past Medical History:  Diagnosis Date  . Allergic rhinitis   . Coronary artery disease    Stent x1 2008  . GERD (gastroesophageal reflux disease)   . Hypertension      Past Surgical History:  Procedure Laterality Date  . ABDOMINAL HERNIA REPAIR    . CARDIAC CATHETERIZATION     stent  . CORONARY ANGIOPLASTY WITH STENT  PLACEMENT  2008   stent x1  . GASTRIC BYPASS  2005  . ROTATOR CUFF REPAIR Left   . TOTAL KNEE ARTHROPLASTY Bilateral    Family History  Problem Relation Age of Onset  . Breast cancer Mother   . Diabetes Mother   . Heart disease Mother   . Prostate cancer Father   . Colon cancer Father   . Heart disease Father   . Breast cancer Sister   . Diabetes Sister   . Lung disease Neg Hx    Social History  Substance Use Topics  . Smoking status: Former Smoker    Packs/day: 1.00    Years: 18.00    Types: Cigarettes    Start date: 02/09/1964    Quit date: 05/23/1982  . Smokeless tobacco: Never Used     Comment: Also smoked 4-5 cigars for 10 years after cigarettes  . Alcohol use 0.0 oz/week     Comment: Rare   Current Outpatient Prescriptions  Medication Sig Dispense Refill  . aspirin EC 81 MG tablet Take 81 mg by mouth daily.    Marland Kitchen. atorvastatin (LIPITOR) 40 MG tablet Take 40 mg by mouth daily.    Marland Kitchen. azithromycin (ZITHROMAX) 500 MG tablet Take 1 tablet (500 mg total) by mouth daily. 7 tablet 0  . B Complex Vitamins (VITAMIN-B COMPLEX PO) Take 1 tablet by mouth daily.    Marland Kitchen. CALCIUM PO Take 1 tablet by mouth daily.    .Marland Kitchen  carvedilol (COREG) 6.25 MG tablet Take 6.25 mg by mouth 2 (two) times daily with a meal.    . clopidogrel (PLAVIX) 75 MG tablet Take 75 mg by mouth daily.    Marland Kitchen. co-enzyme Q-10 50 MG capsule Take 50 mg by mouth daily.    . ferrous sulfate 325 (65 FE) MG tablet Take 325 mg by mouth daily with breakfast.    . Multiple Vitamin (MULTIVITAMIN WITH MINERALS) TABS tablet Take 1 tablet by mouth daily.    Marland Kitchen. olmesartan-hydrochlorothiazide (BENICAR HCT) 20-12.5 MG tablet Take 1 tablet by mouth daily.     Marland Kitchen. omeprazole (PRILOSEC) 40 MG capsule Take 40 mg by mouth daily.    . ranolazine (RANEXA) 500 MG 12 hr tablet Take 250 mg by mouth 2 (two) times daily.      No current facility-administered medications for this visit.    Allergies  Allergen Reactions  . Sulfa Antibiotics Other (See  Comments)  . Tetanus Toxoids     The horse serum for tetanus     Review of Systems: All systems reviewed and negative except where noted in HPI.    Lab Results  Component Value Date   WBC 8.4 08/21/2015   HGB 11.1 (L) 08/21/2015   HCT 33.7 (L) 08/21/2015   MCV 80.6 08/21/2015   PLT 267.0 08/21/2015    Lab Results  Component Value Date   CREATININE 1.09 05/08/2015   BUN 16 05/08/2015   NA 142 05/08/2015   K 4.0 05/08/2015   CL 108 05/08/2015   CO2 25 05/08/2015    No results found for: ALT, AST, GGT, ALKPHOS, BILITOT     Physical Exam: BP 95/65 (BP Location: Right Arm)   Pulse 84   Ht 5' 3.5" (1.613 m) Comment: height measured without shoes  Wt 236 lb 6 oz (107.2 kg)   BMI 41.22 kg/m    Constitutional: Pleasant,well-developed, male in no acute distress. HEENT: Normocephalic and atraumatic. Conjunctivae are normal. No scleral icterus. Neck supple.  Cardiovascular: Normal rate, regular rhythm.  Pulmonary/chest: Effort normal and breath sounds normal. No wheezing, rales or rhonchi. Abdominal: Soft, nondistended, nontender. Bowel sounds active throughout. There are no masses palpable. No hepatomegaly. Extremities: no edema Lymphadenopathy: No cervical adenopathy noted. Neurological: Alert and oriented to person place and time. Skin: Skin is warm and dry. No rashes noted. Psychiatric: Normal mood and affect. Behavior is normal.   ASSESSMENT AND PLAN: 70 y/o male with chronic cough, history of GERD, and dysphagia, presenting with abnormality on barium swallow as above. Findings are most concerning for an underlying motility disorder but recommend EGD initially to ensure no structural abnormality given area of narrowing noted. Pending EGD does not show obvious structural abnormality, will proceed with manometry to evaluate for dysmotility, rule out achalasia, hypercontractile esophagus, etc. I discussed risks / benefits of EGD with him. He wished to proceed. It is  possible esophageal dysmotility could be related to chronic cough. We will obtain cardiology clearance to hold plavix for 5 days prior to EGD, but okay to continue aspirin during this time. He agreed with the plan as outlined. Of note, he has an anemia noted with relatively recent colonoscopy. Will await EGD results and determine if further workup is needed.  Of note, BP mildly low today but patient is asymptomatic, HR normal. I recommend he purchase a home BP cuff to monitor this, and may wish to hold BP medication if it is persistently low, and f/u with primary care in this situation. He  agreed.   Almedia Cellar, MD [facility] Gastroenterology Pager [phone]

## 2015-09-06 NOTE — Telephone Encounter (Signed)
Left a message with Morrie Sheldonshley at [facility] to get anticoagulant clearance for Upper Endoscopy on 09/18/15. She states she will call me back with an answer from Dr. Rae LipsHartigan.

## 2015-09-10 NOTE — Telephone Encounter (Signed)
Called patient to inform him that I had not heard from his Cardiologist yet but I will call there office again. Patient states to let him know if I do not get an answer today, they have the doctor's cell phone number.   Left a voicemail for Morrie SheldonAshley at Dr. Sherrilee GillesHartigan's office at [facility] to return my call.

## 2015-09-10 NOTE — Telephone Encounter (Signed)
Informed patient of Dr. Sherrilee GillesHartigan's response and that he needs to follow up with them first before we can scheduled the procedure. Cancelled procedure at this time and informed patient to call our office once he is seen by Cardiology and got clearance for his procedure. Patient verbalized understanding and will call back.

## 2015-09-10 NOTE — Telephone Encounter (Signed)
Per Morrie SheldonAshley at  Dr. Sherrilee GillesHartigan's office Dr. Rae LipsHartigan does not feel comfortable giving the patient clearance at this time. Patient is scheduled for a repeat echo in October. They would like patient to been seen and have repeat echo first.   Please advise Dr. Adela LankArmbruster.

## 2015-09-10 NOTE — Telephone Encounter (Signed)
Ok, I understand. Can you please relay this to the patient? If possible he should call his cardiologist to see if he can be seen sooner. Once he is cleared we can proceed with scheduling his endoscopy, he should contact us as soon as he hears. Thanks

## 2015-09-13 ENCOUNTER — Telehealth: Payer: Self-pay | Admitting: Gastroenterology

## 2015-09-13 NOTE — Telephone Encounter (Signed)
Records received from patient's last colonoscopy. This is to update his records:  Performed by Dr. Elam Cityavid Chalikian at [facility] Exam date 04/20/2014 - sigmoid diverticulosis, internal hemorrhoids, no polyps noted Told to repeat in 10 years for screening purposes

## 2015-09-14 DIAGNOSIS — I2510 Atherosclerotic heart disease of native coronary artery without angina pectoris: Secondary | ICD-10-CM | POA: Insufficient documentation

## 2015-09-14 DIAGNOSIS — E782 Mixed hyperlipidemia: Secondary | ICD-10-CM | POA: Insufficient documentation

## 2015-09-18 ENCOUNTER — Encounter: Payer: Medicare Other | Admitting: Gastroenterology

## 2015-09-20 LAB — FUNGUS CULTURE W SMEAR

## 2015-09-24 ENCOUNTER — Ambulatory Visit: Payer: Medicare Other | Admitting: Pulmonary Disease

## 2015-10-09 LAB — AFB CULTURE WITH SMEAR (NOT AT ARMC)

## 2015-10-15 ENCOUNTER — Ambulatory Visit: Payer: Medicare Other | Admitting: Pulmonary Disease

## 2015-12-28 ENCOUNTER — Telehealth: Payer: Self-pay | Admitting: Gastroenterology

## 2015-12-28 NOTE — Telephone Encounter (Signed)
Patient does have cardiac clearance letter, according to our records he is on Plavix. He will bring the letter by our office today or Monday. I told him I would like to have Dr. Adela LankArmbruster see the letter first, then if all is okay, will schedule him at [facility] at first available.

## 2016-01-02 ENCOUNTER — Telehealth: Payer: Self-pay | Admitting: Gastroenterology

## 2016-01-02 NOTE — Telephone Encounter (Signed)
Ian FanningJulie, This patient has had approval from his cardiologist to hold plavix for 5 days prior to his EGD. He had a stress echo on 11/[place]/15 [facility] [facility] which showed no evidence of ischemia. He can continue his aspirin in the interim.   Can you please schedule him? Thanks

## 2016-01-03 NOTE — Telephone Encounter (Signed)
Patient scheduled for pre-visit and EGD, instructed to bring his paperwork from cardiologist and list of meds to pre-visit. Dr. Adela LankArmbruster reviewed, should also be scanned into Epic.

## 2016-01-11 ENCOUNTER — Ambulatory Visit (AMBULATORY_SURGERY_CENTER): Payer: Self-pay | Admitting: *Deleted

## 2016-01-11 VITALS — Ht 63.5 in | Wt 246.4 lb

## 2016-01-11 DIAGNOSIS — K224 Dyskinesia of esophagus: Secondary | ICD-10-CM

## 2016-01-11 NOTE — Progress Notes (Signed)
No allergies to eggs or soy. No problems with anesthesia.  Pt given Emmi instructions for egd  No oxygen use  No diet drug use  

## 2016-01-21 ENCOUNTER — Ambulatory Visit (AMBULATORY_SURGERY_CENTER): Payer: Medicare Other | Admitting: Gastroenterology

## 2016-01-21 ENCOUNTER — Encounter: Payer: Self-pay | Admitting: Gastroenterology

## 2016-01-21 VITALS — BP 147/78 | HR 65 | Temp 97.3°F | Resp 18 | Ht 63.0 in | Wt 246.0 lb

## 2016-01-21 DIAGNOSIS — K224 Dyskinesia of esophagus: Secondary | ICD-10-CM

## 2016-01-21 DIAGNOSIS — K2270 Barrett's esophagus without dysplasia: Secondary | ICD-10-CM

## 2016-01-21 MED ORDER — SODIUM CHLORIDE 0.9 % IV SOLN: 500.0000 mL | INTRAVENOUS | Status: AC

## 2016-01-21 NOTE — Patient Instructions (Signed)
YOU HAD AN ENDOSCOPIC PROCEDURE TODAY AT THE [facility]:   Refer to the procedure report that was given to you for any specific questions about what was found during the examination.  If the procedure report does not answer your questions, please call your gastroenterologist to clarify.  If you requested that your care partner not be given the details of your procedure findings, then the procedure report has been included in a sealed envelope for you to review at your convenience later.  YOU SHOULD EXPECT: Some feelings of bloating in the abdomen. Passage of more gas than usual.  Walking can help get rid of the air that was put into your GI tract during the procedure and reduce the bloating. If you had a lower endoscopy (such as a colonoscopy or flexible sigmoidoscopy) you may notice spotting of blood in your stool or on the toilet paper. If you underwent a bowel prep for your procedure, you may not have a normal bowel movement for a few days.  Please Note:  You might notice some irritation and congestion in your nose or some drainage.  This is from the oxygen used during your procedure.  There is no need for concern and it should clear up in a day or so.  SYMPTOMS TO REPORT IMMEDIATELY:   Following upper endoscopy (EGD)  Vomiting of blood or coffee ground material  New chest pain or pain under the shoulder blades  Painful or persistently difficult swallowing  New shortness of breath  Fever of 100F or higher  Black, tarry-looking stools  For urgent or emergent issues, a gastroenterologist can be reached at any hour by calling [phone].   DIET:  We do recommend a small meal at first, but then you may proceed to your regular diet.  Drink plenty of fluids but you should avoid alcoholic beverages for 24 hours.  ACTIVITY:  You should plan to take it easy for the rest of today and you should NOT DRIVE or use heavy machinery until tomorrow (because of the sedation medicines used  during the test).    FOLLOW UP: Our staff will call the number listed on your records the next business day following your procedure to check on you and address any questions or concerns that you may have regarding the information given to you following your procedure. If we do not reach you, we will leave a message.  However, if you are feeling well and you are not experiencing any problems, there is no need to return our call.  We will assume that you have returned to your regular daily activities without incident.  Await biopsy results Resume Plavix tonight   If any biopsies were taken you will be contacted by phone or by letter within the next 1-3 weeks.  Please call us at [phone] if you have not heard about the biopsies in 3 weeks.    SIGNATURES/CONFIDENTIALITY: You and/or your care partner have signed paperwork which will be entered into your electronic medical record.  These signatures attest to the fact that that the information above on your After Visit Summary has been reviewed and is understood.  Full responsibility of the confidentiality of this discharge information lies with you and/or your care-partner.

## 2016-01-21 NOTE — Op Note (Signed)
[facility] Patient Name: Ian PoleDavid [name] Procedure Date: 01/21/2016 11:25 AM MRN: [id number] Endoscopist: Viviann SpareSteven P. Catarina Huntley MD, MD Age: 71 Referring MD:  Date of Birth: 1945/06/25 Gender: Male Account #: [id number] Procedure:                Upper GI endoscopy Indications:              Dysphagia, Abnormal barium swallow with dilated                            esophagus Medicines:                Monitored Anesthesia Care Procedure:                Pre-Anesthesia Assessment:                           - Prior to the procedure, a History and Physical                            was performed, and patient medications and                            allergies were reviewed. The patient's tolerance of                            previous anesthesia was also reviewed. The risks                            and benefits of the procedure and the sedation                            options and risks were discussed with the patient.                            All questions were answered, and informed consent                            was obtained. Prior Anticoagulants: The patient has                            taken Plavix (clopidogrel), last dose was 5 days                            prior to procedure. ASA Grade Assessment: III - A                            patient with severe systemic disease. After                            reviewing the risks and benefits, the patient was                            deemed in satisfactory condition to undergo the  procedure.                           After obtaining informed consent, the endoscope was                            passed under direct vision. Throughout the                            procedure, the patient's blood pressure, pulse, and                            oxygen saturations were monitored continuously. The                            Model GIF-HQ190 [id number]) scope was introduced   through the mouth, and advanced to the jejunum. The                            upper GI endoscopy was accomplished without                            difficulty. The patient tolerated the procedure                            well. Scope In: Scope Out: Findings:                 Esophagogastric landmarks were identified: the                            Z-line was found at 33 cm, the gastroesophageal                            junction was found at 35 cm and the upper extent of                            the gastric folds was found at 35 cm from the                            incisors.                           There were esophageal mucosal changes suggestive of                            Barrett's stage C1-M2 per Prague criteria present                            in the lower third of the esophagus. The maximum                            longitudinal extent of these mucosal changes was 2  cm in length. No nodularity was appreciated. Mucosa                            was biopsied with a cold forceps for histology.                           The lumen of the proximal esophagus and mid                            esophagus was severely dilated, with residual                            secretions concerning for stasis and underlying                            dysmotility, suspicious for achalasia.                           Evidence of a Roux-en-Y gastrojejunostomy was                            found. The gastrojejunal anastomosis was                            characterized by healthy appearing mucosa. The                            gastric pouch was small and did not insufflate                            well, in this light retroflexion was not possible                           The examined jejunal limb was normal. Complications:            No immediate complications. Estimated blood loss:                            Minimal. Estimated Blood Loss:     Estimated blood  loss was minimal. Impression:               - Esophagogastric landmarks identified.                           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 Biopsied.                           - Dilation in the proximal esophagus and in the mid                            esophagus with residual secretions indicative of  stasis. Overall findings are concerning for                            underlying dysmotility, need to rule out achalasia.                           - Roux-en-Y gastrojejunostomy with gastrojejunal                            anastomosis. Small gastric pouch which did not                            insufflate well                           - Normal examined jejunal limb Recommendation:           - Patient has a contact number available for                            emergencies. The signs and symptoms of potential                            delayed complications were discussed with the                            patient. Return to normal activities tomorrow.                            Written discharge instructions were provided to the                            patient.                           - Resume previous diet.                           - Continue present medications including omeprazole                           - Resume Plavix tonight                           - Await pathology results.                           - Repeat upper endoscopy for surveillance based on                            pathology results.                           - Recommend esophageal manometry to further                            evaluate for dysmotility, rule out achalasia. You  will be contacted to schedule this. Viviann Spare P. Gatlin Kittell MD, MD 01/21/2016 11:52:56 AM This report has been signed electronically.

## 2016-01-21 NOTE — Progress Notes (Signed)
Report to PACU, RN, vss, BBS= Clear.  

## 2016-01-21 NOTE — Progress Notes (Signed)
Called to room to assist during endoscopic procedure.  Patient ID and intended procedure confirmed with present staff. Received instructions for my participation in the procedure from the performing physician.  

## 2016-01-22 ENCOUNTER — Telehealth: Payer: Self-pay

## 2016-01-22 NOTE — Telephone Encounter (Signed)
  Follow up Call-  Call back number 01/21/2016  Post procedure Call Back phone  # [phone]  Permission to leave phone message Yes     Patient questions:  Do you have a fever, pain , or abdominal swelling? No. Pain Score  0 *  Have you tolerated food without any problems? Yes.    Have you been able to return to your normal activities? Yes.    Do you have any questions about your discharge instructions: Diet   No. Medications  No. Follow up visit  No.  Do you have questions or concerns about your Care? No.  Actions: * If pain score is 4 or above: No action needed, pain <4.

## 2016-01-23 ENCOUNTER — Other Ambulatory Visit: Payer: Self-pay

## 2016-01-23 ENCOUNTER — Telehealth: Payer: Self-pay

## 2016-01-23 DIAGNOSIS — K224 Dyskinesia of esophagus: Secondary | ICD-10-CM

## 2016-01-23 NOTE — Telephone Encounter (Signed)
-----   Message from Ruffin FrederickSteven Paul Armbruster, MD sent at 01/21/2016 11:53 AM EST ----- Ian PetersHi Julie, I performed an endoscopy for this patient this morning. He needs an esophageal manometry coordinated to rule out achalasia. Can you please call him maybe tomorrow and help coordinate? Thanks

## 2016-01-23 NOTE — Telephone Encounter (Signed)
Patient advised and prep instructions mailed re: esophageal manometry scheduled for 1/24 @ [facility], arrive at 8:00 am for 8:30 test.

## 2016-01-23 NOTE — Progress Notes (Signed)
Instructions mailed.

## 2016-02-06 ENCOUNTER — Ambulatory Visit (HOSPITAL_COMMUNITY)
Admission: RE | Admit: 2016-02-06 | Discharge: 2016-02-06 | Disposition: A | Discharge status: Home / Self Care | Payer: Medicare Other | Source: Ambulatory Visit | Attending: Gastroenterology | Admitting: Gastroenterology

## 2016-02-06 ENCOUNTER — Encounter (HOSPITAL_COMMUNITY)
Admission: RE | Disposition: A | Discharge status: Home / Self Care | Payer: Self-pay | Source: Ambulatory Visit | Attending: Gastroenterology

## 2016-02-06 DIAGNOSIS — R1310 Dysphagia, unspecified: Secondary | ICD-10-CM | POA: Insufficient documentation

## 2016-02-06 DIAGNOSIS — R1319 Other dysphagia: Secondary | ICD-10-CM

## 2016-02-06 HISTORY — PX: ESOPHAGEAL MANOMETRY: SHX5429

## 2016-02-06 SURGERY — MANOMETRY, ESOPHAGUS

## 2016-02-06 MED ORDER — LIDOCAINE VISCOUS 2 % MT SOLN
OROMUCOSAL | Status: AC
  Filled 2016-02-06: qty 15

## 2016-02-06 SURGICAL SUPPLY — 2 items
FACESHIELD LNG OPTICON STERILE (SAFETY) IMPLANT
GLOVE BIO SURGEON STRL SZ8 (GLOVE) ×6 IMPLANT

## 2016-02-06 NOTE — Progress Notes (Signed)
Esophageal Manometry performed per protocol,

## 2016-02-08 ENCOUNTER — Encounter (HOSPITAL_COMMUNITY): Payer: Self-pay | Admitting: Gastroenterology

## 2016-02-08 DIAGNOSIS — R1310 Dysphagia, unspecified: Secondary | ICD-10-CM

## 2016-02-08 DIAGNOSIS — R1319 Other dysphagia: Secondary | ICD-10-CM

## 2016-02-19 ENCOUNTER — Other Ambulatory Visit: Payer: Self-pay

## 2016-02-19 DIAGNOSIS — R1319 Other dysphagia: Secondary | ICD-10-CM

## 2016-02-19 DIAGNOSIS — R1310 Dysphagia, unspecified: Secondary | ICD-10-CM

## 2016-06-26 DIAGNOSIS — E119 Type 2 diabetes mellitus without complications: Secondary | ICD-10-CM | POA: Insufficient documentation

## 2017-08-31 DIAGNOSIS — R9439 Abnormal result of other cardiovascular function study: Secondary | ICD-10-CM | POA: Insufficient documentation

## 2017-08-31 DIAGNOSIS — I200 Unstable angina: Secondary | ICD-10-CM | POA: Insufficient documentation

## 2017-11-12 ENCOUNTER — Ambulatory Visit: Payer: Medicare Other | Admitting: Cardiovascular Disease

## 2017-11-12 ENCOUNTER — Encounter

## 2017-11-16 ENCOUNTER — Ambulatory Visit: Payer: Medicare Other | Admitting: Cardiovascular Disease

## 2017-11-27 ENCOUNTER — Encounter (INDEPENDENT_AMBULATORY_CARE_PROVIDER_SITE_OTHER): Payer: Self-pay

## 2017-11-27 ENCOUNTER — Encounter: Payer: Self-pay | Admitting: Cardiovascular Disease

## 2017-11-27 ENCOUNTER — Ambulatory Visit: Payer: Medicare Other | Admitting: Cardiovascular Disease

## 2017-11-27 VITALS — BP 102/72 | HR 58 | Ht 63.0 in | Wt 217.0 lb

## 2017-11-27 DIAGNOSIS — I2510 Atherosclerotic heart disease of native coronary artery without angina pectoris: Secondary | ICD-10-CM

## 2017-11-27 MED ORDER — PANTOPRAZOLE SODIUM 40 MG PO TBEC: 40.0000 mg | DELAYED_RELEASE_TABLET | Freq: Every day | ORAL | 1 refills | Status: DC

## 2017-11-27 NOTE — Patient Instructions (Addendum)
Medication Instructions:  Your physician has recommended you make the following change in your medication:   STOP Omeprazole (Prilosec) START Protonix (Pantoprazole) 40 mg once daily - you will need to get future refills from PCP if he decides to keep you on this medication  If you need a refill on your cardiac medications before your next appointment, please call your pharmacy.     Lab work: Your physician recommends that you return for lab work in: 6 months on the day of or a few days before your office visit with Dr. Elease HashimotoNahser.  You will need to FAST for this appointment - nothing to eat or drink after midnight the night before except water.     Testing/Procedures: None Ordered    Follow-Up: At [facility], you and your health needs are our priority.  As part of our continuing mission to provide you with exceptional heart care, we have created designated Provider Care Teams.  These Care Teams include your primary Cardiologist (physician) and Advanced Practice Providers (APPs -  Physician Assistants and Nurse Practitioners) who all work together to provide you with the care you need, when you need it. You will need a follow up appointment in:  6 months.  Please call our office 2 months in advance to schedule this appointment.  You may see Dr. Elease HashimotoNahser or one of the following Advanced Practice Providers on your designated Care Team: Tereso NewcomerScott Weaver, PA-C Vin RumseyBhagat, [place] . Berton BonJanine Hammond, NP

## 2017-11-27 NOTE — Progress Notes (Signed)
Cardiology Office Note:    Date:  11/27/2017   ID:  Ian [name], DOB 1945-10-03, MRN [id number]  PCP:  Gaspar Garbeisovec, Richard W, MD  Cardiologist:  Ian MissPhilip Clyde Upshaw, MD  Electrophysiologist:  None   Referring MD:  Tisovec   Problem list 1.  Coronary artery disease 2.  Hyperlipidemia 3.  Hypertension 4.  Left bundle branch block 5.  Hyperlipidemia 6. Gastric bypass - ~2005 7  Anemia ( iron def. B12 deficiency )   Chief Complaint  Patient presents with  . Coronary Artery Disease        Ian PoleDavid [name] is a 72 y.o. male with a hx of   CAD, HTN, LBBB [place] moved from [place].  Seen with wife Ian [name].  Ian [name] is seen today for the first time.  He has a history of coronary artery disease.  He has a history of stenting in August, 2019 in [place]. He is felt well following the stents. Recent  ( Sept. 2017, 2019) total cholesterol level is 90.  The HDL is 35.  The LDL is 46.  The triglyceride level is 44.  Retired Presenter, broadcastingBrickmason / Surveyor, mineralscontractor  Works in his [employer] now  No real exercise      Past Medical History:  Diagnosis Date  . Allergic rhinitis   . Cataract   . Coronary artery disease    Stent x1 2008  . GERD (gastroesophageal reflux disease)   . Hyperlipidemia   . Hypertension     Past Surgical History:  Procedure Laterality Date  . ABDOMINAL HERNIA REPAIR    . CARDIAC CATHETERIZATION     stent  . CORONARY ANGIOPLASTY WITH STENT PLACEMENT  2008   stent x1  . ESOPHAGEAL MANOMETRY N/A 02/06/2016   Procedure: ESOPHAGEAL MANOMETRY (EM);  Surgeon: Ruffin FrederickSteven Paul Armbruster, MD;  Location: [facility];  Service: Gastroenterology;  Laterality: N/A;  . GASTRIC BYPASS  2005  . GASTRIC BYPASS  2005  . ROTATOR CUFF REPAIR Left 2007  . TOTAL KNEE ARTHROPLASTY Bilateral     Current Medications: Current Meds  Medication Sig  . aspirin EC 81 MG tablet Take 81 mg by mouth daily.  Marland Kitchen. atorvastatin (LIPITOR) 40 MG tablet Take 40 mg by mouth daily.  . B Complex-C-Folic Acid (NEPHROCAPS  PO) Take 1 capsule by mouth daily.  Marland Kitchen. CALCIUM PO Take 1 tablet by mouth daily.  . carvedilol (COREG) 6.25 MG tablet Take 6.25 mg by mouth 2 (two) times daily with a meal.  . clopidogrel (PLAVIX) 75 MG tablet Take 75 mg by mouth daily.  Marland Kitchen. co-enzyme Q-10 50 MG capsule Take 50 mg by mouth daily.  . ferrous sulfate 325 (65 FE) MG tablet Take 1 tablet by mouth 3 (three) times daily.  . Multiple Vitamin (MULTIVITAMIN WITH MINERALS) TABS tablet Take 1 tablet by mouth daily.  . primidone (MYSOLINE) 50 MG tablet Take 1 tablet by mouth 2 (two) times daily.  . ranolazine (RANEXA) 500 MG 12 hr tablet Take 500 mg by mouth 2 (two) times daily.   . vitamin B-12 (CYANOCOBALAMIN) 1000 MCG tablet Take 1,000 mcg by mouth daily.  . vitamin C (ASCORBIC ACID) 500 MG tablet Take 500 mg by mouth daily.  . Vitamin D, Ergocalciferol, (DRISDOL) 1.25 MG (50000 UT) CAPS capsule Take 1 capsule by mouth once a week.  . [DISCONTINUED] omeprazole (PRILOSEC) 40 MG capsule Take 40 mg by mouth daily.   Current Facility-Administered Medications for the 11/27/17 encounter (Office Visit) with Ian [name], Ian PingPhilip J, MD  Medication  .  0.9 %  sodium chloride infusion     Allergies:   Sulfa antibiotics and Tetanus toxoids   Social History   Socioeconomic History  . Marital status: Married    Spouse name: Not on file  . Number of children: 2  . Years of education: Not on file  . Highest education level: Not on file  Occupational History  . Occupation: Statistician  Social Needs  . Financial resource strain: Not on file  . Food insecurity:    Worry: Not on file    Inability: Not on file  . Transportation needs:    Medical: Not on file    Non-medical: Not on file  Tobacco Use  . Smoking status: Former Smoker    Packs/day: 1.00    Years: 18.00    Pack years: 18.00    Types: Cigarettes    Start date: 02/09/1964    Last attempt to quit: 05/23/1982    Years since quitting: 35.5  . Smokeless tobacco: Never Used  . Tobacco  comment: Also smoked 4-5 cigars for 10 years after cigarettes  Substance and Sexual Activity  . Alcohol use: Yes    Alcohol/week: 0.0 standard drinks    Comment: 1 glass wine a month  . Drug use: No  . Sexual activity: Not on file  Lifestyle  . Physical activity:    Days per week: Not on file    Minutes per session: Not on file  . Stress: Not on file  Relationships  . Social connections:    Talks on phone: Not on file    Gets together: Not on file    Attends religious service: Not on file    Active member of club or organization: Not on file    Attends meetings of clubs or organizations: Not on file    Relationship status: Not on file  Other Topics Concern  . Not on file  Social History Narrative   Originally from [place]. Has lived in [place], [place]. Previously has traveled to [place], [place], [place], [place], & on [place] cruises. Currently works as a Designer, fashion/clothing. No pets currently. Remote bird exposure. No mold or hot tub exposure. No known asbestos exposure.      Family History: The patient's family history includes Breast cancer in his mother and sister; Colon cancer (age of onset: 47) in his father; Diabetes in his mother and sister; Heart disease in his father and mother; Prostate cancer in his father. There is no history of Lung disease or Esophageal cancer.  ROS:   Please see the history of present illness.     All other systems reviewed and are negative.  EKGs/Labs/Other Studies Reviewed:    The following studies were reviewed today:    EKG:    Recent Labs: No results found for requested labs within last 8760 hours.  Recent Lipid Panel No results found for: CHOL, TRIG, HDL, CHOLHDL, VLDL, LDLCALC, LDLDIRECT  Physical Exam:    VS:  BP 102/72   Pulse (!) 58   Ht 5\' 3"  (1.6 m)   Wt 217 lb (98.4 kg)   SpO2 97%   BMI 38.44 kg/m     Wt Readings from Last 3 Encounters:  11/27/17 217 lb (98.4 kg)  01/21/16 246 lb (111.6 kg)  01/11/16 246 lb 6.4 oz (111.8  kg)     GEN:  Elderly male,  NAD  HEENT: Normal NECK: No JVD; No carotid bruits LYMPHATICS: No lymphadenopathy CARDIAC: RRR, no murmurs, rubs, gallops RESPIRATORY:  Clear to auscultation without rales, wheezing or rhonchi  ABDOMEN: Soft, non-tender, non-distended MUSCULOSKELETAL:  No edema; No deformity  SKIN: Warm and dry NEUROLOGIC:  Alert and oriented x 3 PSYCHIATRIC:  Normal affect   ASSESSMENT:    1. Coronary artery disease involving native coronary artery of native heart without angina pectoris    PLAN:    In order of problems listed above:  1. 1 coronary artery disease: Ian [name] had stents placed in August, 2019.  He seems to be doing well and has not had any episodes of angina.  He is on Plavix and omeprazole.  We will discontinue the omeprazole and start him on Protonix 40 mg a day.  Levels from his primary medical doctor's office look great.  Continue atorvastatin.  Urged him to walk on a regular basis.  2.    Hyperlipidemia: His lipid levels look great.  Continue atorvastatin and Zetia.  3.  Left bundle branch block-stable   Medication Adjustments/Labs and Tests Ordered: Current medicines are reviewed at length with the patient today.  Concerns regarding medicines are outlined above.  Orders Placed This Encounter  Procedures  . Lipid Profile  . Hepatic function panel  . Basic Metabolic Panel (BMET)  . EKG 12-Lead   Meds ordered this encounter  Medications  . pantoprazole (PROTONIX) 40 MG tablet    Sig: Take 1 tablet (40 mg total) by mouth daily.    Dispense:  90 tablet    Refill:  1     Patient Instructions  Medication Instructions:  Your physician has recommended you make the following change in your medication:   STOP Omeprazole (Prilosec) START Protonix (Pantoprazole) 40 mg once daily - you will need to get future refills from PCP if he decides to keep you on this medication  If you need a refill on your cardiac medications before your next  appointment, please call your pharmacy.     Lab work: Your physician recommends that you return for lab work in: 6 months on the day of or a few days before your office visit with Dr. Elease Hashimoto.  You will need to FAST for this appointment - nothing to eat or drink after midnight the night before except water.     Testing/Procedures: None Ordered    Follow-Up: At [facility], you and your health needs are our priority.  As part of our continuing mission to provide you with exceptional heart care, we have created designated Provider Care Teams.  These Care Teams include your primary Cardiologist (physician) and Advanced Practice Providers (APPs -  Physician Assistants and Nurse Practitioners) who all work together to provide you with the care you need, when you need it. You will need a follow up appointment in:  6 months.  Please call our office 2 months in advance to schedule this appointment.  You may see Dr. Elease Hashimoto or one of the following Advanced Practice Providers on your designated Care Team: Tereso Newcomer, PA-C Vin Altha, [place] . Berton Bon, NP      Signed, Ian Miss, MD  11/27/2017 1:19 PM    [facility]

## 2018-06-24 ENCOUNTER — Other Ambulatory Visit: Payer: Self-pay | Admitting: Cardiovascular Disease

## 2018-09-13 ENCOUNTER — Other Ambulatory Visit: Payer: Self-pay

## 2018-09-13 ENCOUNTER — Other Ambulatory Visit: Payer: Medicare Other

## 2018-09-13 ENCOUNTER — Encounter (INDEPENDENT_AMBULATORY_CARE_PROVIDER_SITE_OTHER): Payer: Self-pay

## 2018-09-13 DIAGNOSIS — I2510 Atherosclerotic heart disease of native coronary artery without angina pectoris: Secondary | ICD-10-CM

## 2018-09-13 LAB — LIPID PANEL
Chol/HDL Ratio: 2.3 ratio (ref 0.0–5.0)
Cholesterol, Total: 111 mg/dL (ref 100–199)
HDL: 49 mg/dL (ref >39)
LDL Chol Calc (NIH): 52 mg/dL (ref 0–99)
Triglycerides: 33 mg/dL (ref 0–149)
VLDL Cholesterol Cal: 10 mg/dL (ref 5–40)

## 2018-09-13 LAB — BASIC METABOLIC PANEL
BUN/Creatinine Ratio: 12 (ref 10–24)
BUN: 13 mg/dL (ref 8–27)
CO2: 25 mmol/L (ref 20–29)
Calcium: 8.9 mg/dL (ref 8.6–10.2)
Chloride: 106 mmol/L (ref 96–106)
Creatinine, Ser: 1.12 mg/dL (ref 0.76–1.27)
GFR calc Af Amer: 74 mL/min/{1.73_m2} (ref >59)
GFR calc non Af Amer: 64 mL/min/{1.73_m2} (ref >59)
Glucose: 90 mg/dL (ref 65–99)
Potassium: 4.1 mmol/L (ref 3.5–5.2)
Sodium: 143 mmol/L (ref 134–144)

## 2018-09-13 LAB — HEPATIC FUNCTION PANEL
ALT: 25 IU/L (ref 0–44)
AST: 25 IU/L (ref 0–40)
Albumin: 4 g/dL (ref 3.7–4.7)
Alkaline Phosphatase: 90 IU/L (ref 39–117)
Bilirubin Total: 0.5 mg/dL (ref 0.0–1.2)
Bilirubin, Direct: 0.18 mg/dL (ref 0.00–0.40)
Total Protein: 6.1 g/dL (ref 6.0–8.5)

## 2018-09-17 ENCOUNTER — Telehealth: Payer: Self-pay | Admitting: Nurse Practitioner

## 2018-09-17 NOTE — Telephone Encounter (Signed)
Called patient regarding appointment with Dr. Acie Fredrickson on 9/9. He states he is not having any problems and would prefer to be seen in the office since it is close to 1 year since his last appointment. I rescheduled him to 10/27 on a clinic day and he states he may run out of medication prior to that date. I advised him to call back if refills are needed and if he has any problems to ask for me by name. He thanked me for the call.

## 2018-09-22 ENCOUNTER — Ambulatory Visit: Payer: Medicare Other | Admitting: Cardiovascular Disease

## 2018-10-26 ENCOUNTER — Other Ambulatory Visit: Payer: Self-pay | Admitting: Cardiovascular Disease

## 2018-10-26 MED ORDER — RANOLAZINE ER 500 MG PO TB12: 500.0000 mg | ORAL_TABLET | Freq: Two times a day (BID) | ORAL | 0 refills | Status: DC

## 2018-10-26 NOTE — Telephone Encounter (Signed)
Pt's medication was sent to pt's pharmacy as requested. Confirmation received.  °

## 2018-11-08 NOTE — Progress Notes (Signed)
Cardiology Office Note:    Date:  11/09/2018   ID:  Ian [name], DOB [birth year], MRN [id number]  PCP:  Haywood Pao, MD  Cardiologist:  Mertie Moores, MD  Electrophysiologist:  None   Referring MD:  Tisovec   Problem list 1.  Coronary artery disease 2.  Hyperlipidemia 3.  Hypertension 4.  Left bundle branch block 5.  Hyperlipidemia 6. Gastric bypass - ~2005 7  Anemia ( iron def. B12 deficiency )   Chief Complaint  Patient presents with  . Coronary Artery Disease  . Hyperlipidemia        Ian [name] is a 73 y.o. male with a hx of   CAD, HTN, LBBB Just moved from [place].  Seen with wife Ian [name].  Ian [name] is seen today for the first time.  He has a history of coronary artery disease.  He has a history of stenting in August, 2019 in [place]. He is felt well following the stents. Recent  ( Sept. 2017, 2019) total cholesterol level is 90.  The HDL is 35.  The LDL is 46.  The triglyceride level is 44.  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 27, 2020: Ian [name] is seen today for follow-up of his coronary artery disease.  He has a history of hyperlipidemia.  He has a stable LBBB.  Busy in his wood shop Has been diagnosed with essential tremor ( is on Primidone)  We discussed drug interactions with his multiple cardiac meds.  No CP since his stenting several years ago   Past Medical History:  Diagnosis Date  . Allergic rhinitis   . Cataract   . Coronary artery disease    Stent x1 2008  . GERD (gastroesophageal reflux disease)   . Hyperlipidemia   . Hypertension     Past Surgical History:  Procedure Laterality Date  . ABDOMINAL HERNIA REPAIR    . CARDIAC CATHETERIZATION     stent  . CORONARY ANGIOPLASTY WITH STENT PLACEMENT  2008   stent x1  . ESOPHAGEAL MANOMETRY N/A 02/06/2016   Procedure: ESOPHAGEAL MANOMETRY (EM);  Surgeon: Manus Gunning, MD;  Location: [facility];  Service: Gastroenterology;   Laterality: N/A;  . GASTRIC BYPASS  2005  . GASTRIC BYPASS  2005  . ROTATOR CUFF REPAIR Left 2007  . TOTAL KNEE ARTHROPLASTY Bilateral     Current Medications: Current Meds  Medication Sig  . aspirin EC 81 MG tablet Take 81 mg by mouth daily.  Marland Kitchen atorvastatin (LIPITOR) 40 MG tablet Take 40 mg by mouth daily.  . B Complex-C-Folic Acid (NEPHROCAPS PO) Take 1 capsule by mouth daily.  Marland Kitchen CALCIUM PO Take 1 tablet by mouth daily.  . carvedilol (COREG) 6.25 MG tablet Take 6.25 mg by mouth 2 (two) times daily with a meal.  . clopidogrel (PLAVIX) 75 MG tablet Take 75 mg by mouth daily.  Marland Kitchen co-enzyme Q-10 50 MG capsule Take 50 mg by mouth daily.  . ferrous sulfate 325 (65 FE) MG tablet Take 1 tablet by mouth 3 (three) times daily.  . Multiple Vitamin (MULTIVITAMIN WITH MINERALS) TABS tablet Take 1 tablet by mouth daily.  . pantoprazole (PROTONIX) 40 MG tablet TAKE 1 TABLET(40 MG) BY MOUTH DAILY  . primidone (MYSOLINE) 50 MG tablet Take 1 tablet by mouth 2 (two) times daily.  . ranolazine (RANEXA) 500 MG 12 hr tablet Take 1 tablet (500 mg total) by mouth 2 (two) times daily.  . vitamin  B-12 (CYANOCOBALAMIN) 1000 MCG tablet Take 1,000 mcg by mouth daily.  . vitamin C (ASCORBIC ACID) 500 MG tablet Take 500 mg by mouth daily.  . Vitamin D, Ergocalciferol, (DRISDOL) 1.25 MG (50000 UT) CAPS capsule Take 1 capsule by mouth once a week.   Current Facility-Administered Medications for the 11/09/18 encounter (Office Visit) with Nahser, Deloris PingPhilip J, MD  Medication  . 0.9 %  sodium chloride infusion     Allergies:   Sulfa antibiotics and Tetanus toxoids   Social History   Socioeconomic History  . Marital status: Married    Spouse name: Not on file  . Number of children: 2  . Years of education: Not on file  . Highest education level: Not on file  Occupational History  . Occupation: Statisticianbrick layer  Social Needs  . Financial resource strain: Not on file  . Food insecurity    Worry: Not on file     Inability: Not on file  . Transportation needs    Medical: Not on file    Non-medical: Not on file  Tobacco Use  . Smoking status: Former Smoker    Packs/day: 1.00    Years: 18.00    Pack years: 18.00    Types: Cigarettes    Start date: 02/09/1964    Quit date: 05/23/1982    Years since quitting: 36.4  . Smokeless tobacco: Never Used  . Tobacco comment: Also smoked 4-5 cigars for 10 years after cigarettes  Substance and Sexual Activity  . Alcohol use: Yes    Alcohol/week: 0.0 standard drinks    Comment: 1 glass wine a month  . Drug use: No  . Sexual activity: Not on file  Lifestyle  . Physical activity    Days per week: Not on file    Minutes per session: Not on file  . Stress: Not on file  Relationships  . Social Musicianconnections    Talks on phone: Not on file    Gets together: Not on file    Attends religious service: Not on file    Active member of club or organization: Not on file    Attends meetings of clubs or organizations: Not on file    Relationship status: Not on file  Other Topics Concern  . Not on file  Social History Narrative   Originally from [place]. Has lived in [place], [place]. Previously has traveled to [place], [place], [place], [place], & on [place]ises. Currently works as a Designer, fashion/clothingmason contractor. No pets currently. Remote bird exposure. No mold or hot tub exposure. No known asbestos exposure.      Family History: The patient's family history includes Breast cancer in his mother and sister; Colon cancer (age of onset: 3082) in his father; Diabetes in his mother and sister; Heart disease in his father and mother; Prostate cancer in his father. There is no history of Lung disease or Esophageal cancer.  ROS:   Please see the history of present illness.     All other systems reviewed and are negative.  EKGs/Labs/Other Studies Reviewed:    The following studies were reviewed today:    Recent Labs: 09/13/2018: ALT 25; BUN 13; Creatinine, Ser 1.12; Potassium 4.1;  Sodium 143  Recent Lipid Panel    Component Value Date/Time   CHOL 111 09/13/2018 0741   TRIG 33 09/13/2018 0741   HDL 49 09/13/2018 0741   CHOLHDL 2.3 09/13/2018 0741   LDLCALC 52 09/13/2018 0741   Physical Exam: Blood pressure 108/82, pulse 60, height 5'  6" (1.676 m), weight 230 lb 12.8 oz (104.7 kg), SpO2 98 %.  GEN:  Well nourished, well developed in no acute distress, moderately obese elderly gentleman. HEENT: Normal NECK: No JVD; No carotid bruits LYMPHATICS: No lymphadenopathy CARDIAC: RRR soft systolic murmur. RESPIRATORY:  Clear to auscultation without rales, wheezing or rhonchi  ABDOMEN: Soft, non-tender, non-distended MUSCULOSKELETAL:  No edema; No deformity  SKIN: Warm and dry NEUROLOGIC:  Alert and oriented x 3   ECG:   November 09, 2018: Normal sinus rhythm at 60 beats a minute.  Left bundle branch block.  No changes from previous EKGs.    ASSESSMENT:    1. Coronary artery disease involving native coronary artery of native heart without angina pectoris    PLAN:      1.  coronary artery disease: Ian [name] had stents placed in August, 2019.   he denies having any chest pain.  2.    Hyperlipidemia: Last lipid levels look great.  3.  Left bundle branch block-stable.   Medication Adjustments/Labs and Tests Ordered: Current medicines are reviewed at length with the patient today.  Concerns regarding medicines are outlined above.  Orders Placed This Encounter  Procedures  . Hepatic function panel  . Lipid panel  . Basic metabolic panel  . EKG 12-Lead   No orders of the defined types were placed in this encounter.    Patient Instructions  Medication Instructions:  Your provider recommends that you continue on your current medications as directed. Please refer to the Current Medication list given to you today.   *If you need a refill on your cardiac medications before your next appointment, please call your pharmacy*  Lab Work: Your provider recommends  that you return for FASTING lab work prior to your visit in 1 year. If you have labs (blood work) drawn today and your tests are completely normal, you will receive your results only by: Marland Kitchen MyChart Message (if you have MyChart) OR . A paper copy in the mail If you have any lab test that is abnormal or we need to change your treatment, we will call you to review the results.  Follow-Up: At [facility], you and your health needs are our priority.  As part of our continuing mission to provide you with exceptional heart care, we have created designated Provider Care Teams.  These Care Teams include your primary Cardiologist (physician) and Advanced Practice Providers (APPs -  Physician Assistants and Nurse Practitioners) who all work together to provide you with the care you need, when you need it. Your next appointment:   12 months The format for your next appointment:   In Person Provider:   You may see Kristeen Miss, MD or one of the following Advanced Practice Providers on your designated Care Team:    Tereso Newcomer, PA-C  Vin [place], [place]  Berton Bon, [place]     Signed, Kristeen Miss, MD  11/09/2018 8:52 AM    [facility]

## 2018-11-09 ENCOUNTER — Other Ambulatory Visit: Payer: Self-pay

## 2018-11-09 ENCOUNTER — Ambulatory Visit: Payer: Medicare Other | Admitting: Cardiovascular Disease

## 2018-11-09 ENCOUNTER — Encounter: Payer: Self-pay | Admitting: Cardiovascular Disease

## 2018-11-09 VITALS — BP 108/82 | HR 60 | Ht 66.0 in | Wt 230.8 lb

## 2018-11-09 DIAGNOSIS — I2510 Atherosclerotic heart disease of native coronary artery without angina pectoris: Secondary | ICD-10-CM

## 2018-11-09 NOTE — Patient Instructions (Signed)
Medication Instructions:  Your provider recommends that you continue on your current medications as directed. Please refer to the Current Medication list given to you today.   *If you need a refill on your cardiac medications before your next appointment, please call your pharmacy*  Lab Work: Your provider recommends that you return for FASTING lab work prior to your visit in 1 year. If you have labs (blood work) drawn today and your tests are completely normal, you will receive your results only by: Marland Kitchen MyChart Message (if you have MyChart) OR . A paper copy in the mail If you have any lab test that is abnormal or we need to change your treatment, we will call you to review the results.  Follow-Up: At [facility], you and your health needs are our priority.  As part of our continuing mission to provide you with exceptional heart care, we have created designated Provider Care Teams.  These Care Teams include your primary Cardiologist (physician) and Advanced Practice Providers (APPs -  Physician Assistants and Nurse Practitioners) who all work together to provide you with the care you need, when you need it. Your next appointment:   12 months The format for your next appointment:   In Person Provider:   You may see Mertie Moores, MD or one of the following Advanced Practice Providers on your designated Care Team:    Richardson Dopp, PA-C  [place], [place]  Daune Perch, [place]

## 2019-01-17 ENCOUNTER — Other Ambulatory Visit: Payer: Self-pay | Admitting: *Deleted

## 2019-01-17 MED ORDER — RANOLAZINE ER 500 MG PO TB12: 500.0000 mg | ORAL_TABLET | Freq: Two times a day (BID) | ORAL | 3 refills | Status: DC

## 2019-01-27 ENCOUNTER — Ambulatory Visit: Payer: Medicare Other | Attending: Internal Medicine

## 2019-01-27 DIAGNOSIS — Z23 Encounter for immunization: Secondary | ICD-10-CM | POA: Insufficient documentation

## 2019-01-27 NOTE — Progress Notes (Signed)
   Covid-19 Vaccination Clinic  Name:  Ian [name]    MRN: [id number] DOB: 07-Dec-1945  01/27/2019  [name] was observed post Covid-19 immunization for 30 minutes based on pre-vaccination screening without incidence. He was provided with Vaccine Information Sheet and instruction to access the V-Safe system.   [name] was instructed to call 911 with any severe reactions post vaccine: Marland Kitchen Difficulty breathing  . Swelling of your face and throat  . A fast heartbeat  . A bad rash all over your body  . Dizziness and weakness    Immunizations Administered    Name Date Dose VIS Date Route   Pfizer COVID-19 Vaccine 01/27/2019 11:37 AM 0.3 mL 12/24/2018 Intramuscular   Manufacturer: [employer], [employer]   Lot: [id number]   NDC: [address]-4

## 2019-02-15 ENCOUNTER — Ambulatory Visit: Payer: Medicare Other | Attending: Internal Medicine

## 2019-02-15 DIAGNOSIS — Z23 Encounter for immunization: Secondary | ICD-10-CM

## 2019-02-15 NOTE — Progress Notes (Signed)
   Covid-19 Vaccination Clinic  Name:  [name]    MRN: [id number] DOB: 10-Jun-1945  02/15/2019  Mr. Kriegel was observed post Covid-19 immunization for 15 minutes without incidence. He was provided with Vaccine Information Sheet and instruction to access the V-Safe system.   Mr. Nicolini was instructed to call 911 with any severe reactions post vaccine: Marland Kitchen Difficulty breathing  . Swelling of your face and throat  . A fast heartbeat  . A bad rash all over your body  . Dizziness and weakness    Immunizations Administered    Name Date Dose VIS Date Route   Pfizer COVID-19 Vaccine 02/15/2019  8:27 AM 0.3 mL 12/24/2018 Intramuscular   Manufacturer: [employer], [employer]   Lot: [id number]   NDC: [address]-4

## 2019-04-01 ENCOUNTER — Other Ambulatory Visit: Payer: Self-pay | Admitting: Cardiovascular Disease

## 2019-04-01 MED ORDER — PANTOPRAZOLE SODIUM 40 MG PO TBEC: DELAYED_RELEASE_TABLET | ORAL | 1 refills | Status: DC

## 2019-05-10 ENCOUNTER — Other Ambulatory Visit: Payer: Self-pay

## 2019-05-10 MED ORDER — CLOPIDOGREL BISULFATE 75 MG PO TABS: 75.0000 mg | ORAL_TABLET | Freq: Every day | ORAL | 3 refills | Status: DC

## 2019-05-10 NOTE — Telephone Encounter (Signed)
Pt calling requesting a refill on clopidogrel. Would Dr. Elease Hashimoto like to refill this medication? Please address

## 2019-09-10 ENCOUNTER — Other Ambulatory Visit: Payer: Self-pay | Admitting: Cardiovascular Disease

## 2019-09-12 ENCOUNTER — Other Ambulatory Visit: Payer: Self-pay

## 2019-09-12 ENCOUNTER — Telehealth: Payer: Self-pay | Admitting: Cardiovascular Disease

## 2019-09-12 MED ORDER — ATORVASTATIN CALCIUM 40 MG PO TABS: 40.0000 mg | ORAL_TABLET | Freq: Every day | ORAL | 0 refills | Status: DC

## 2019-09-12 NOTE — Telephone Encounter (Signed)
*  STAT* If patient is at the pharmacy, call can be transferred to refill team.   1. Which medications need to be refilled? (please list name of each medication and dose if known) atorvastatin (LIPITOR) 40 MG tablet  2. Which pharmacy/location (including street and city if local pharmacy) is medication to be sent to? [id number] - [place], [place] - [address] AT [facility] [address]  3. Do they need a 30 day or 90 day supply? 90 day

## 2019-09-12 NOTE — Telephone Encounter (Signed)
Pt's medication was sent to pt's pharmacy as requested. Confirmation received.  °

## 2019-09-13 ENCOUNTER — Ambulatory Visit: Payer: Medicare Other | Attending: Internal Medicine

## 2019-09-13 ENCOUNTER — Other Ambulatory Visit: Payer: Self-pay

## 2019-09-13 DIAGNOSIS — Z23 Encounter for immunization: Secondary | ICD-10-CM

## 2019-09-13 NOTE — Telephone Encounter (Signed)
Pt calling stating that he needed a refill on his medication carvedilol, because he does not see his previous cardiologist anymore, now he sees Dr. Elease Hashimoto. Would Dr. Elease Hashimoto like to refill this medication? Please address

## 2019-09-13 NOTE — Progress Notes (Signed)
   Covid-19 Vaccination Clinic  Name:  Ian [name]    MRN: [id number] DOB: Nov 03, 1945  09/13/2019  Mr. Ian [name] was observed post Covid-19 immunization for 15 minutes without incident. He was provided with Vaccine Information Sheet and instruction to access the V-Safe system.   Ian [name] was instructed to call 911 with any severe reactions post vaccine: Marland Kitchen Difficulty breathing  . Swelling of face and throat  . A fast heartbeat  . A bad rash all over body  . Dizziness and weakness

## 2019-09-20 NOTE — Telephone Encounter (Signed)
Pt calling again about his carvedilol. Pt would like to know if Dr. Elease Hashimoto will refill this medication? Please address

## 2019-09-21 MED ORDER — CARVEDILOL 6.25 MG PO TABS: 6.2500 mg | ORAL_TABLET | Freq: Two times a day (BID) | ORAL | 0 refills | Status: DC

## 2019-09-21 NOTE — Telephone Encounter (Signed)
OK to refill per Dr. Elease Hashimoto

## 2019-11-02 ENCOUNTER — Telehealth: Payer: Self-pay | Admitting: Cardiovascular Disease

## 2019-11-02 ENCOUNTER — Other Ambulatory Visit: Payer: Self-pay

## 2019-11-02 MED ORDER — CARVEDILOL 6.25 MG PO TABS: 6.2500 mg | ORAL_TABLET | Freq: Two times a day (BID) | ORAL | 0 refills | Status: DC

## 2019-11-02 MED ORDER — RANOLAZINE ER 500 MG PO TB12: 500.0000 mg | ORAL_TABLET | Freq: Two times a day (BID) | ORAL | 3 refills | Status: DC

## 2019-11-02 MED ORDER — ATORVASTATIN CALCIUM 40 MG PO TABS: 40.0000 mg | ORAL_TABLET | Freq: Every day | ORAL | 0 refills | Status: DC

## 2019-11-02 NOTE — Telephone Encounter (Signed)
*  STAT* If patient is at the pharmacy, call can be transferred to refill team.   1. Which medications need to be refilled? (please list name of each medication and dose if known) need a new prescription for Atorvastatin,  Ranolazine , Carvedilol and  Clopidogrel  2. Which pharmacy/location (including street and city if local pharmacy) is medication to be sent to? [facility]  [phone]  3. Do they need a 30 day or 90 day supply?  90 days and refills

## 2020-01-05 ENCOUNTER — Telehealth: Payer: Self-pay | Admitting: Cardiovascular Disease

## 2020-01-05 MED ORDER — CLOPIDOGREL BISULFATE 75 MG PO TABS: 75.0000 mg | ORAL_TABLET | Freq: Every day | ORAL | 0 refills | Status: DC

## 2020-01-05 MED ORDER — ATORVASTATIN CALCIUM 40 MG PO TABS: 40.0000 mg | ORAL_TABLET | Freq: Every day | ORAL | 0 refills | Status: DC

## 2020-01-05 MED ORDER — RANOLAZINE ER 500 MG PO TB12: 500.0000 mg | ORAL_TABLET | Freq: Two times a day (BID) | ORAL | 0 refills | Status: DC

## 2020-01-05 MED ORDER — CARVEDILOL 6.25 MG PO TABS: 6.2500 mg | ORAL_TABLET | Freq: Two times a day (BID) | ORAL | 0 refills | Status: DC

## 2020-01-05 NOTE — Telephone Encounter (Signed)
   *  STAT* If patient is at the pharmacy, call can be transferred to refill team.   1. Which medications need to be refilled? (please list name of each medication and dose if known)   atorvastatin (LIPITOR) 40 MG tablet    carvedilol (COREG) 6.25 MG tablet    clopidogrel (PLAVIX) 75 MG tablet    ranolazine (RANEXA) 500 MG 12 hr tablet   2. Which pharmacy/location (including street and city if local pharmacy) is medication to be sent to? [id number] - [place], [place] - [address] [facility] [address]  3. Do they need a 30 day or 90 day supply? 90 days

## 2020-01-05 NOTE — Telephone Encounter (Signed)
Pt's medications were sent to pt's pharmacy as requested. Confirmation received.  

## 2020-02-06 ENCOUNTER — Other Ambulatory Visit: Payer: Medicare Other | Admitting: *Deleted

## 2020-02-06 ENCOUNTER — Other Ambulatory Visit: Payer: Self-pay

## 2020-02-06 DIAGNOSIS — I2510 Atherosclerotic heart disease of native coronary artery without angina pectoris: Secondary | ICD-10-CM

## 2020-02-06 LAB — BASIC METABOLIC PANEL
BUN/Creatinine Ratio: 18 (ref 10–24)
BUN: 18 mg/dL (ref 8–27)
CO2: 25 mmol/L (ref 20–29)
Calcium: 8.9 mg/dL (ref 8.6–10.2)
Chloride: 105 mmol/L (ref 96–106)
Creatinine, Ser: 1 mg/dL (ref 0.76–1.27)
GFR calc Af Amer: 84 mL/min/{1.73_m2} (ref >59)
GFR calc non Af Amer: 73 mL/min/{1.73_m2} (ref >59)
Glucose: 108 mg/dL — ABNORMAL HIGH (ref 65–99)
Potassium: 4.2 mmol/L (ref 3.5–5.2)
Sodium: 142 mmol/L (ref 134–144)

## 2020-02-06 LAB — LIPID PANEL
Chol/HDL Ratio: 2.4 ratio (ref 0.0–5.0)
Cholesterol, Total: 116 mg/dL (ref 100–199)
HDL: 48 mg/dL (ref >39)
LDL Chol Calc (NIH): 56 mg/dL (ref 0–99)
Triglycerides: 48 mg/dL (ref 0–149)
VLDL Cholesterol Cal: 12 mg/dL (ref 5–40)

## 2020-02-06 LAB — HEPATIC FUNCTION PANEL
ALT: 25 IU/L (ref 0–44)
AST: 27 IU/L (ref 0–40)
Albumin: 4.1 g/dL (ref 3.7–4.7)
Alkaline Phosphatase: 85 IU/L (ref 44–121)
Bilirubin Total: 0.5 mg/dL (ref 0.0–1.2)
Bilirubin, Direct: 0.18 mg/dL (ref 0.00–0.40)
Total Protein: 6 g/dL (ref 6.0–8.5)

## 2020-02-12 ENCOUNTER — Encounter: Payer: Self-pay | Admitting: Cardiovascular Disease

## 2020-02-12 NOTE — Progress Notes (Signed)
Cardiology Office Note:    Date:  02/13/2020   ID:  [name], DOB 1945-12-03, MRN [id number]  PCP:  Gaspar Garbe, MD  Cardiologist:  Kristeen Miss, MD  Electrophysiologist:  None   Referring MD:  Tisovec   Problem list 1.  Coronary artery disease 2.  Hyperlipidemia 3.  Hypertension 4.  Left bundle branch block 5.  Hyperlipidemia 6. Gastric bypass - ~2005 7  Anemia ( iron def. B12 deficiency )   Chief Complaint  Patient presents with  . Coronary Artery Disease        Ian [name] is a 75 y.o. male with a hx of   CAD, HTN, LBBB Just moved from [place].  Seen with wife [name].  [name] is seen today for the first time.  He has a history of coronary artery disease.  He has a history of stenting in August, 2019 in [place]. He is felt well following the stents. Recent  ( Sept. 2017, 2019) total cholesterol level is 90.  The HDL is 35.  The LDL is 46.  The triglyceride level is 44.  Retired Presenter, broadcasting / Surveyor, minerals  Works in his [employer] now  No real exercise   OCT. 27, 2020: [name] is seen today for follow-up of his coronary artery disease.  He has a history of hyperlipidemia.  He has a stable LBBB.  Busy in his wood shop Has been diagnosed with essential tremor ( is on Primidone)  We discussed drug interactions with his multiple cardiac meds.  No CP since his stenting several years ago   Jan. 31, 2022: Ian [name] is seen today for follow up of his CAD, HLD. LBBB  Hx of coronary stenting in 2019.  Still doing lots of woodworking .    He told me about a new table saw - the sawstop  Is very active in his shop but no real cardio exercise     Past Medical History:  Diagnosis Date  . Allergic rhinitis   . Cataract   . Coronary artery disease    Stent x1 2008  . GERD (gastroesophageal reflux disease)   . Hyperlipidemia   . Hypertension     Past Surgical History:  Procedure Laterality Date  . ABDOMINAL HERNIA REPAIR    . CARDIAC CATHETERIZATION     stent   . CORONARY ANGIOPLASTY WITH STENT PLACEMENT  2008   stent x1  . ESOPHAGEAL MANOMETRY N/A 02/06/2016   Procedure: ESOPHAGEAL MANOMETRY (EM);  Surgeon: Ruffin Frederick, MD;  Location: [facility];  Service: Gastroenterology;  Laterality: N/A;  . GASTRIC BYPASS  2005  . GASTRIC BYPASS  2005  . ROTATOR CUFF REPAIR Left 2007  . TOTAL KNEE ARTHROPLASTY Bilateral     Current Medications: Current Meds  Medication Sig  . aspirin EC 81 MG tablet Take 81 mg by mouth daily.  . B Complex-C-Folic Acid (NEPHROCAPS PO) Take 1 capsule by mouth daily.  Marland Kitchen CALCIUM PO Take 1 tablet by mouth daily.  Marland Kitchen co-enzyme Q-10 50 MG capsule Take 50 mg by mouth daily.  . ferrous sulfate 325 (65 FE) MG tablet Take 1 tablet by mouth 3 (three) times daily.  . Multiple Vitamin (MULTIVITAMIN WITH MINERALS) TABS tablet Take 1 tablet by mouth daily.  . primidone (MYSOLINE) 50 MG tablet Take 1 tablet by mouth 2 (two) times daily.  . vitamin B-12 (CYANOCOBALAMIN) 1000 MCG tablet Take 1,000 mcg by mouth daily.  . vitamin C (ASCORBIC ACID) 500 MG tablet Take 500 mg by  mouth daily.  . Vitamin D, Ergocalciferol, (DRISDOL) 1.25 MG (50000 UT) CAPS capsule Take 1 capsule by mouth once a week.  . [DISCONTINUED] atorvastatin (LIPITOR) 40 MG tablet Take 1 tablet (40 mg total) by mouth daily. Please keep upcoming appt in January 2022 with Dr. Elease Hashimoto before anymore refills. Thank you  . [DISCONTINUED] carvedilol (COREG) 6.25 MG tablet Take 1 tablet (6.25 mg total) by mouth 2 (two) times daily with a meal. Please keep upcoming appt in January 2022 with Dr. Elease Hashimoto before anymore refills. Thank you  . [DISCONTINUED] clopidogrel (PLAVIX) 75 MG tablet Take 1 tablet (75 mg total) by mouth daily. Please keep upcoming appt in January 2022 with Dr. Elease Hashimoto before anymore refills. Thank you  . [DISCONTINUED] pantoprazole (PROTONIX) 40 MG tablet TAKE 1 TABLET(40 MG) BY MOUTH DAILY. Please make yearly appt with Dr. Elease Hashimoto for October for future  refills. 1st attempt  . [DISCONTINUED] ranolazine (RANEXA) 500 MG 12 hr tablet Take 1 tablet (500 mg total) by mouth 2 (two) times daily. Please keep upcoming appt in January 2022 with Dr. Elease Hashimoto before anymore refills. Thank you   Current Facility-Administered Medications for the 02/13/20 encounter (Office Visit) with Tonianne Fine, Deloris Ping, MD  Medication  . 0.9 %  sodium chloride infusion     Allergies:   Sulfa antibiotics and Tetanus toxoids   Social History   Socioeconomic History  . Marital status: Married    Spouse name: Not on file  . Number of children: 2  . Years of education: Not on file  . Highest education level: Not on file  Occupational History  . Occupation: Statistician  Tobacco Use  . Smoking status: Former Smoker    Packs/day: 1.00    Years: 18.00    Pack years: 18.00    Types: Cigarettes    Start date: 02/09/1964    Quit date: 05/23/1982    Years since quitting: 37.7  . Smokeless tobacco: Never Used  . Tobacco comment: Also smoked 4-5 cigars for 10 years after cigarettes  Substance and Sexual Activity  . Alcohol use: Yes    Alcohol/week: 0.0 standard drinks    Comment: 1 glass wine a month  . Drug use: No  . Sexual activity: Not on file  Other Topics Concern  . Not on file  Social History Narrative   Originally from [place]. Has lived in [place], [place]. Previously has traveled to [place], [place], [place], & on [place] cruises. Currently works as a Designer, fashion/clothing. No pets currently. Remote bird exposure. No mold or hot tub exposure. No known asbestos exposure.    Social Determinants of Health   Financial Resource Strain: Not on file  Food Insecurity: Not on file  Transportation Needs: Not on file  Physical Activity: Not on file  Stress: Not on file  Social Connections: Not on file     Family History: The patient's family history includes Breast cancer in his mother and sister; Colon cancer (age of onset: 43) in his father; Diabetes in his mother  and sister; Heart disease in his father and mother; Prostate cancer in his father. There is no history of Lung disease or Esophageal cancer.  ROS:   Please see the history of present illness.     All other systems reviewed and are negative.  EKGs/Labs/Other Studies Reviewed:    The following studies were reviewed today:    Recent Labs: 02/06/2020: ALT 25; BUN 18; Creatinine, Ser 1.00; Potassium 4.2; Sodium 142  Recent Lipid Panel  Component Value Date/Time   CHOL 116 02/06/2020 0835   TRIG 48 02/06/2020 0835   HDL 48 02/06/2020 0835   CHOLHDL 2.4 02/06/2020 0835   LDLCALC 56 02/06/2020 0835    Physical Exam: Blood pressure 130/82, pulse 61, height 5\' 6"  (1.676 m), weight 229 lb (103.9 kg), SpO2 97 %.  GEN:  Well nourished, well developed in no acute distress HEENT: Normal NECK: No JVD; No carotid bruits LYMPHATICS: No lymphadenopathy CARDIAC: RRR , no murmurs, rubs, gallops RESPIRATORY:  Clear to auscultation without rales, wheezing or rhonchi  ABDOMEN: Soft, non-tender, non-distended MUSCULOSKELETAL:  No edema; No deformity  SKIN: Warm and dry NEUROLOGIC:  Alert and oriented x 3   ECG:    Jan. 31, 2022 NSR at 61.   LBBB    ASSESSMENT:    1. Coronary artery disease involving native coronary artery of native heart without angina pectoris    PLAN:      1.  coronary artery disease:  No angina .    2.    Hyperlipidemia: lipids look great   3.  Left bundle branch block-. Will get an echo to look for dyssynchrony He has a very wide QRS.  Cont current meds.   Office visit in 1 year.    Medication Adjustments/Labs and Tests Ordered: Current medicines are reviewed at length with the patient today.  Concerns regarding medicines are outlined above.  Orders Placed This Encounter  Procedures  . EKG 12-Lead  . ECHOCARDIOGRAM COMPLETE   Meds ordered this encounter  Medications  . atorvastatin (LIPITOR) 40 MG tablet    Sig: Take 1 tablet (40 mg total) by  mouth daily.    Dispense:  90 tablet    Refill:  3  . carvedilol (COREG) 6.25 MG tablet    Sig: Take 1 tablet (6.25 mg total) by mouth 2 (two) times daily with a meal.    Dispense:  180 tablet    Refill:  3  . clopidogrel (PLAVIX) 75 MG tablet    Sig: Take 1 tablet (75 mg total) by mouth daily.    Dispense:  90 tablet    Refill:  3  . pantoprazole (PROTONIX) 40 MG tablet    Sig: TAKE 1 TABLET(40 MG) BY MOUTH DAILY.    Dispense:  30 tablet    Refill:  0  . ranolazine (RANEXA) 500 MG 12 hr tablet    Sig: Take 1 tablet (500 mg total) by mouth 2 (two) times daily.    Dispense:  180 tablet    Refill:  3     There are no Patient Instructions on file for this visit.   Signed, 01-06-1987, MD  02/13/2020 10:19 AM    [facility]

## 2020-02-13 ENCOUNTER — Other Ambulatory Visit: Payer: Self-pay

## 2020-02-13 ENCOUNTER — Encounter: Payer: Self-pay | Admitting: Cardiovascular Disease

## 2020-02-13 ENCOUNTER — Ambulatory Visit: Payer: Medicare Other | Admitting: Cardiovascular Disease

## 2020-02-13 VITALS — BP 130/82 | HR 61 | Ht 66.0 in | Wt 229.0 lb

## 2020-02-13 DIAGNOSIS — I447 Left bundle-branch block, unspecified: Secondary | ICD-10-CM

## 2020-02-13 DIAGNOSIS — I2510 Atherosclerotic heart disease of native coronary artery without angina pectoris: Secondary | ICD-10-CM

## 2020-02-13 DIAGNOSIS — I10 Essential (primary) hypertension: Secondary | ICD-10-CM

## 2020-02-13 MED ORDER — RANOLAZINE ER 500 MG PO TB12: 500.0000 mg | ORAL_TABLET | Freq: Two times a day (BID) | ORAL | 3 refills | Status: DC

## 2020-02-13 MED ORDER — ATORVASTATIN CALCIUM 40 MG PO TABS: 40.0000 mg | ORAL_TABLET | Freq: Every day | ORAL | 3 refills | Status: DC

## 2020-02-13 MED ORDER — PANTOPRAZOLE SODIUM 40 MG PO TBEC: DELAYED_RELEASE_TABLET | ORAL | 0 refills | Status: DC

## 2020-02-13 MED ORDER — CLOPIDOGREL BISULFATE 75 MG PO TABS: 75.0000 mg | ORAL_TABLET | Freq: Every day | ORAL | 3 refills | Status: DC

## 2020-02-13 MED ORDER — CARVEDILOL 6.25 MG PO TABS: 6.2500 mg | ORAL_TABLET | Freq: Two times a day (BID) | ORAL | 3 refills | Status: DC

## 2020-02-13 NOTE — Patient Instructions (Signed)
Medication Instructions:  Your physician recommends that you continue on your current medications as directed. Please refer to the Current Medication list given to you today. *If you need a refill on your cardiac medications before your next appointment, please call your pharmacy*   Lab Work: none If you have labs (blood work) drawn today and your tests are completely normal, you will receive your results only by: Marland Kitchen MyChart Message (if you have MyChart) OR . A paper copy in the mail If you have any lab test that is abnormal or we need to change your treatment, we will call you to review the results.   Testing/Procedures: Your physician has requested that you have an echocardiogram. Echocardiography is a painless test that uses sound waves to create images of your heart. It provides your doctor with information about the size and shape of your heart and how well your heart's chambers and valves are working. This procedure takes approximately one hour. There are no restrictions for this procedure.    Follow-Up: At [facility], you and your health needs are our priority.  As part of our continuing mission to provide you with exceptional heart care, we have created designated Provider Care Teams.  These Care Teams include your primary Cardiologist (physician) and Advanced Practice Providers (APPs -  Physician Assistants and Nurse Practitioners) who all work together to provide you with the care you need, when you need it.  Your next appointment:   1 year(s)  The format for your next appointment:   In Person  Provider:   You may see Kristeen Miss, MDor one of the following Advanced Practice Providers on your designated Care Team:    Tereso Newcomer, PA-C  [place], [place]

## 2020-03-02 ENCOUNTER — Other Ambulatory Visit: Payer: Self-pay

## 2020-03-02 ENCOUNTER — Ambulatory Visit (HOSPITAL_COMMUNITY): Payer: Medicare Other | Attending: Internal Medicine

## 2020-03-02 DIAGNOSIS — I2510 Atherosclerotic heart disease of native coronary artery without angina pectoris: Secondary | ICD-10-CM | POA: Diagnosis not present

## 2020-03-02 LAB — ECHOCARDIOGRAM COMPLETE
AV Mean grad: 10.4 mmHg
AV Peak grad: 16.6 mmHg
Ao pk vel: 2.04 m/s
Area-P 1/2: 2.22 cm2
S' Lateral: 3.6 cm

## 2020-03-05 ENCOUNTER — Telehealth: Payer: Self-pay | Admitting: Cardiovascular Disease

## 2020-03-05 DIAGNOSIS — I447 Left bundle-branch block, unspecified: Secondary | ICD-10-CM

## 2020-03-05 DIAGNOSIS — I2510 Atherosclerotic heart disease of native coronary artery without angina pectoris: Secondary | ICD-10-CM

## 2020-03-05 MED ORDER — SACUBITRIL-VALSARTAN 24-26 MG PO TABS: 1.0000 | ORAL_TABLET | Freq: Two times a day (BID) | ORAL | 3 refills | Status: DC

## 2020-03-05 NOTE — Telephone Encounter (Signed)
-----   Message from Vesta Mixer, MD sent at 03/02/2020  5:16 PM EST ----- Has LBBB and is now found to have midly reduced LV function  Is on coreg. Start entresto 24-26 mg BID  Bmp in 3 weeks.  Office visit in 3 months with me or app

## 2020-03-05 NOTE — Telephone Encounter (Signed)
Pt called back in , returning call for echo results   Best number [phone]

## 2020-03-05 NOTE — Telephone Encounter (Signed)
Reviewed results with patient who verbalized understanding.   Instructed patient to START ENTRESTO 24-26 mg twice daily. BMET scheduled 03/27/20. Office visit with Dr. Elease Hashimoto scheduled 06/15/20. He was grateful for call and agrees with plan.   Per patient request, will request echo CD from cardiologist (Dr. Bascom Levels) for Dr. Elease Hashimoto to review.

## 2020-03-27 ENCOUNTER — Other Ambulatory Visit: Payer: Self-pay

## 2020-03-27 ENCOUNTER — Other Ambulatory Visit: Payer: Medicare Other | Admitting: *Deleted

## 2020-03-27 DIAGNOSIS — I447 Left bundle-branch block, unspecified: Secondary | ICD-10-CM

## 2020-03-27 DIAGNOSIS — I2510 Atherosclerotic heart disease of native coronary artery without angina pectoris: Secondary | ICD-10-CM

## 2020-03-27 LAB — BASIC METABOLIC PANEL
BUN/Creatinine Ratio: 15 (ref 10–24)
BUN: 18 mg/dL (ref 8–27)
CO2: 20 mmol/L (ref 20–29)
Calcium: 9.1 mg/dL (ref 8.6–10.2)
Chloride: 105 mmol/L (ref 96–106)
Creatinine, Ser: 1.21 mg/dL (ref 0.76–1.27)
Glucose: 183 mg/dL — ABNORMAL HIGH (ref 65–99)
Potassium: 3.9 mmol/L (ref 3.5–5.2)
Sodium: 142 mmol/L (ref 134–144)
eGFR: 62 mL/min/{1.73_m2} (ref >59)

## 2020-04-02 ENCOUNTER — Telehealth: Payer: Self-pay | Admitting: Cardiovascular Disease

## 2020-04-02 NOTE — Telephone Encounter (Signed)
Patient called to see if we have requested his health record from [facility] center in [place], [place]. Patient feels its very important that we do that, and he would like to know when that has been done. Please advise

## 2020-04-06 NOTE — Telephone Encounter (Signed)
What should we request, do we need all records or just a few years back?

## 2020-04-10 NOTE — Telephone Encounter (Signed)
Records request faxed to [facility]

## 2020-05-01 ENCOUNTER — Telehealth: Payer: Self-pay | Admitting: *Deleted

## 2020-05-01 NOTE — Telephone Encounter (Signed)
   [facility] Pre-operative Risk Assessment    Patient Name: Ian [name]  DOB: 1945-11-17  MRN: [id number]   HEARTCARE STAFF: - Please ensure there is not already an duplicate clearance open for this procedure. - Under Visit Info/Reason for Call, type in Other and utilize the format Clearance MM/DD/YY or Clearance TBD. Do not use dashes or single digits. - If request is for dental extraction, please clarify the # of teeth to be extracted.  Request for surgical clearance:  1. What type of surgery is being performed? 1 TOOTH TO BE EXTRACTED   2. When is this surgery scheduled? TBD   3. What type of clearance is required (medical clearance vs. Pharmacy clearance to hold med vs. Both)? MEDICAL  4. Are there any medications that need to be held prior to surgery and how long? ASA AND PLAVIX   5. Practice name and name of physician performing surgery? [facility]; Quin Hoop, DDS   6. What is the office phone number? [phone]   7.   What is the office fax number? [phone]  8.   Anesthesia type (None, local, MAC, general) ? LOCAL   Julaine Hua 05/01/2020, 8:22 AM  _________________________________________________________________   (provider comments below)

## 2020-05-01 NOTE — Telephone Encounter (Signed)
   Patient Name: Ian [name]  DOB: May 22, 1945  MRN: [id number]   Primary Cardiologist: Kristeen Miss, MD  Chart reviewed as part of pre-operative protocol coverage.   Simple dental extractions are considered low risk procedures per guidelines and generally do not require any specific cardiac clearance. It is also generally accepted that for simple extractions and dental cleanings, there is no need to interrupt blood thinner therapy.  SBE prophylaxis is not required for the patient from a cardiac standpoint.  I will route this recommendation to the requesting party via Epic fax function and remove from pre-op pool.  Please call with questions.  [place], [place] 05/01/2020, 10:34 AM

## 2020-06-15 ENCOUNTER — Ambulatory Visit: Payer: Medicare Other | Admitting: Cardiovascular Disease

## 2020-06-28 ENCOUNTER — Encounter: Payer: Self-pay | Admitting: Cardiovascular Disease

## 2020-06-28 NOTE — Progress Notes (Signed)
Cardiology Office Note:    Date:  06/29/2020   ID:  Ian [name], DOB 1945-01-31, MRN [id number]  PCP:  Gaspar Garbe, MD  Cardiologist:  Kristeen Miss, MD  Electrophysiologist:  None   Referring MD:  [place]   Problem list 1.  Coronary artery disease 2.  Hyperlipidemia 3.  Hypertension 4.  Left bundle branch block 5.  Hyperlipidemia 6. Gastric bypass - ~2005 7  Anemia ( iron def. B12 deficiency )   Chief Complaint  Patient presents with   Coronary Artery Disease        Ian [name] is a 75 y.o. male with a hx of   CAD, HTN, LBBB Just moved from [place].  Seen with wife Ian [name].  Ian [name] is seen today for the first time.  He has a history of coronary artery disease.  He has a history of stenting in August, 2019 in [place]. He is felt well following the stents. Recent  ( Sept. 2017, 2019) total cholesterol level is 90.  The HDL is 35.  The LDL is 46.  The triglyceride level is 44.  Retired Presenter, broadcasting / Surveyor, minerals  Works in his [employer] now  No real exercise   OCT. 27, 2020: Ian [name] is seen today for follow-up of his coronary artery disease.  He has a history of hyperlipidemia.  He has a stable LBBB.  Busy in his wood shop Has been diagnosed with essential tremor ( is on Primidone)  We discussed drug interactions with his multiple cardiac meds.  No CP since his stenting several years ago   Jan. 31, 2022: Ian [name] is seen today for follow up of his CAD, HLD. LBBB  Hx of coronary stenting in 2019.  Still doing lots of woodworking .    He told me about a new table saw - the sawstop  Is very active in his shop but no real cardio exercise   June 29, 2020: Ian [name] is seen today for follow up of his CAD and LBBB, HTN Echo on Feb. 18, 2022 shows mildly reduced LV EF (40-45%) and he was started on Entresto 24-26 BID He has mild AS Is losing some weight  Is on Primidone and topamax  Has lost 20 pounds .   Past Medical History:  Diagnosis Date   Allergic rhinitis     Cataract    Coronary artery disease    Stent x1 2008   GERD (gastroesophageal reflux disease)    Hyperlipidemia    Hypertension     Past Surgical History:  Procedure Laterality Date   ABDOMINAL HERNIA REPAIR     CARDIAC CATHETERIZATION     stent   CORONARY ANGIOPLASTY WITH STENT PLACEMENT  2008   stent x1   ESOPHAGEAL MANOMETRY N/A 02/06/2016   Procedure: ESOPHAGEAL MANOMETRY (EM);  Surgeon: Ruffin Frederick, MD;  Location: [facility];  Service: Gastroenterology;  Laterality: N/A;   GASTRIC BYPASS  2005   GASTRIC BYPASS  2005   ROTATOR CUFF REPAIR Left 2007   TOTAL KNEE ARTHROPLASTY Bilateral     Current Medications: Current Meds  Medication Sig   atorvastatin (LIPITOR) 40 MG tablet Take 1 tablet (40 mg total) by mouth daily.   B Complex-C-Folic Acid (NEPHROCAPS PO) Take 1 capsule by mouth daily.   CALCIUM PO Take 1 tablet by mouth daily.   carvedilol (COREG) 6.25 MG tablet Take 1 tablet (6.25 mg total) by mouth 2 (two) times daily with a meal.   clopidogrel (PLAVIX) 75 MG tablet Take 1  tablet (75 mg total) by mouth daily.   co-enzyme Q-10 50 MG capsule Take 50 mg by mouth daily.   ferrous sulfate 325 (65 FE) MG tablet Take 1 tablet by mouth 3 (three) times daily.   Multiple Vitamin (MULTIVITAMIN WITH MINERALS) TABS tablet Take 1 tablet by mouth daily.   pantoprazole (PROTONIX) 40 MG tablet TAKE 1 TABLET(40 MG) BY MOUTH DAILY.   primidone (MYSOLINE) 50 MG tablet Take 1 tablet by mouth 2 (two) times daily.   ranolazine (RANEXA) 500 MG 12 hr tablet Take 1 tablet (500 mg total) by mouth 2 (two) times daily.   sacubitril-valsartan (ENTRESTO) 24-26 MG Take 1 tablet by mouth 2 (two) times daily.   topiramate (TOPAMAX) 50 MG tablet Take 50 mg by mouth 2 (two) times daily.   vitamin B-12 (CYANOCOBALAMIN) 1000 MCG tablet Take 1,000 mcg by mouth daily.   vitamin C (ASCORBIC ACID) 500 MG tablet Take 500 mg by mouth daily.   Vitamin D, Ergocalciferol, (DRISDOL) 1.25 MG (50000 UT)  CAPS capsule Take 1 capsule by mouth once a week.   [DISCONTINUED] aspirin EC 81 MG tablet Take 81 mg by mouth daily.   Current Facility-Administered Medications for the 06/29/20 encounter (Office Visit) with Ian [name], Ian [name] Ping, MD  Medication   0.9 %  sodium chloride infusion     Allergies:   Sulfa antibiotics and Tetanus toxoids   Social History   Socioeconomic History   Marital status: Married    Spouse name: Not on file   Number of children: 2   Years of education: Not on file   Highest education level: Not on file  Occupational History   Occupation: brick layer  Tobacco Use   Smoking status: Former    Packs/day: 1.00    Years: 18.00    Pack years: 18.00    Types: Cigarettes    Start date: 02/09/1964    Quit date: 05/23/1982    Years since quitting: 38.1   Smokeless tobacco: Never   Tobacco comments:    Also smoked 4-5 cigars for 10 years after cigarettes  Substance and Sexual Activity   Alcohol use: Yes    Alcohol/week: 0.0 standard drinks    Comment: 1 glass wine a month   Drug use: No   Sexual activity: Not on file  Other Topics Concern   Not on file  Social History Narrative   Originally from [place]. Has lived in [place], [place]. Previously has traveled to [place], [place], [place], [place], & on [place] cruises. Currently works as a Designer, fashion/clothing. No pets currently. Remote bird exposure. No mold or hot tub exposure. No known asbestos exposure.    Social Determinants of Health   Financial Resource Strain: Not on file  Food Insecurity: Not on file  Transportation Needs: Not on file  Physical Activity: Not on file  Stress: Not on file  Social Connections: Not on file     Family History: The patient's family history includes Breast cancer in his mother and sister; Colon cancer (age of onset: 53) in his father; Diabetes in his mother and sister; Heart disease in his father and mother; Prostate cancer in his father. There is no history of Lung disease or Esophageal  cancer.  ROS:   Please see the history of present illness.     All other systems reviewed and are negative.  EKGs/Labs/Other Studies Reviewed:    The following studies were reviewed today:    Recent Labs: 02/06/2020: ALT 25 03/27/2020: BUN 18; Creatinine, Ser  1.21; Potassium 3.9; Sodium 142  Recent Lipid Panel    Component Value Date/Time   CHOL 116 02/06/2020 0835   TRIG 48 02/06/2020 0835   HDL 48 02/06/2020 0835   CHOLHDL 2.4 02/06/2020 0835   LDLCALC 56 02/06/2020 0835    Physical Exam: Blood pressure 94/64, pulse (!) 59, height 5\' 6"  (1.676 m), weight 211 lb (95.7 kg), SpO2 97 %.  GEN:  Well nourished, well developed in no acute distress HEENT: Normal NECK: No JVD; No carotid bruits LYMPHATICS: No lymphadenopathy CARDIAC: RRR , no murmurs, rubs, gallops RESPIRATORY:  Clear to auscultation without rales, wheezing or rhonchi  ABDOMEN: Soft, non-tender, non-distended MUSCULOSKELETAL:  No edema; No deformity  SKIN: Warm and dry NEUROLOGIC:  Alert and oriented x 3   ECG:        ASSESSMENT:    1. Coronary artery disease involving native coronary artery of native heart without angina pectoris     PLAN:      1.  coronary artery disease:   s/p stenting in 2019.   Will DC ASA,  cont plavix .   No angina     2.    Hyperlipidemia:  cont current meds  3.  Left bundle branch block-. Mildly reduced LV EF .  Cont entresto     Office visit in 6 months  with APP    Medication Adjustments/Labs and Tests Ordered: Current medicines are reviewed at length with the patient today.  Concerns regarding medicines are outlined above.  Orders Placed This Encounter  Procedures   Basic metabolic panel    No orders of the defined types were placed in this encounter.    Patient Instructions  Medication Instructions:  Your physician has recommended you make the following change in your medication:  STOP Aspirin  *If you need a refill on your cardiac medications  before your next appointment, please call your pharmacy*   Lab Work: TODAY:BMET  If you have labs (blood work) drawn today and your tests are completely normal, you will receive your results only by: MyChart Message (if you have MyChart) OR A paper copy in the mail If you have any lab test that is abnormal or we need to change your treatment, we will call you to review the results.   Testing/Procedures: none   Follow-Up: At [facility], you and your health needs are our priority.  As part of our continuing mission to provide you with exceptional heart care, we have created designated Provider Care Teams.  These Care Teams include your primary Cardiologist (physician) and Advanced Practice Providers (APPs -  Physician Assistants and Nurse Practitioners) who all work together to provide you with the care you need, when you need it.  We recommend signing up for the patient portal called "MyChart".  Sign up information is provided on this After Visit Summary.  MyChart is used to connect with patients for Virtual Visits (Telemedicine).  Patients are able to view lab/test results, encounter notes, upcoming appointments, etc.  Non-urgent messages can be sent to your provider as well.   To learn more about what you can do with MyChart, go to [facility].    Your next appointment:    12/03/2020 at 8:20am  The format for your next appointment:   In Person  Provider:   12/05/2020, MD   Signed, Kristeen Miss, MD  06/29/2020 9:11 AM    [facility]

## 2020-06-29 ENCOUNTER — Other Ambulatory Visit: Payer: Self-pay

## 2020-06-29 ENCOUNTER — Ambulatory Visit: Payer: Medicare Other | Admitting: Cardiovascular Disease

## 2020-06-29 ENCOUNTER — Encounter: Payer: Self-pay | Admitting: Cardiovascular Disease

## 2020-06-29 VITALS — BP 94/64 | HR 59 | Ht 66.0 in | Wt 211.0 lb

## 2020-06-29 DIAGNOSIS — I2510 Atherosclerotic heart disease of native coronary artery without angina pectoris: Secondary | ICD-10-CM

## 2020-06-29 LAB — BASIC METABOLIC PANEL
BUN/Creatinine Ratio: 16 (ref 10–24)
BUN: 18 mg/dL (ref 8–27)
CO2: 21 mmol/L (ref 20–29)
Calcium: 9.2 mg/dL (ref 8.6–10.2)
Chloride: 108 mmol/L — ABNORMAL HIGH (ref 96–106)
Creatinine, Ser: 1.1 mg/dL (ref 0.76–1.27)
Glucose: 124 mg/dL — ABNORMAL HIGH (ref 65–99)
Potassium: 3.9 mmol/L (ref 3.5–5.2)
Sodium: 142 mmol/L (ref 134–144)
eGFR: 69 mL/min/{1.73_m2} (ref >59)

## 2020-06-29 NOTE — Patient Instructions (Signed)
Medication Instructions:  Your physician has recommended you make the following change in your medication:  STOP Aspirin  *If you need a refill on your cardiac medications before your next appointment, please call your pharmacy*   Lab Work: TODAY:BMET  If you have labs (blood work) drawn today and your tests are completely normal, you will receive your results only by: MyChart Message (if you have MyChart) OR A paper copy in the mail If you have any lab test that is abnormal or we need to change your treatment, we will call you to review the results.   Testing/Procedures: none   Follow-Up: At [facility], you and your health needs are our priority.  As part of our continuing mission to provide you with exceptional heart care, we have created designated Provider Care Teams.  These Care Teams include your primary Cardiologist (physician) and Advanced Practice Providers (APPs -  Physician Assistants and Nurse Practitioners) who all work together to provide you with the care you need, when you need it.  We recommend signing up for the patient portal called "MyChart".  Sign up information is provided on this After Visit Summary.  MyChart is used to connect with patients for Virtual Visits (Telemedicine).  Patients are able to view lab/test results, encounter notes, upcoming appointments, etc.  Non-urgent messages can be sent to your provider as well.   To learn more about what you can do with MyChart, go to [email or web address].    Your next appointment:    12/03/2020 at 8:20am  The format for your next appointment:   In Person  Provider:   Kristeen Miss, MD

## 2020-07-23 ENCOUNTER — Other Ambulatory Visit: Payer: Self-pay

## 2020-07-23 MED ORDER — PANTOPRAZOLE SODIUM 40 MG PO TBEC: DELAYED_RELEASE_TABLET | ORAL | 3 refills | Status: DC

## 2020-07-23 NOTE — Telephone Encounter (Signed)
[facility] pharmacy is requesting a refill on pantoprazole. Would Dr. Nahser like to refill this medication? Please address 

## 2020-07-23 NOTE — Telephone Encounter (Signed)
Ok to fill for a year? 

## 2020-10-10 ENCOUNTER — Other Ambulatory Visit: Payer: Self-pay

## 2020-10-10 ENCOUNTER — Emergency Department (HOSPITAL_COMMUNITY): Payer: Medicare Other

## 2020-10-10 ENCOUNTER — Emergency Department (HOSPITAL_COMMUNITY)
Admission: EM | Admit: 2020-10-10 | Discharge: 2020-10-10 | Disposition: A | Discharge status: Home / Self Care | Payer: Medicare Other | Attending: Emergency Medicine | Admitting: Emergency Medicine

## 2020-10-10 ENCOUNTER — Encounter (HOSPITAL_COMMUNITY): Payer: Self-pay

## 2020-10-10 DIAGNOSIS — Z87891 Personal history of nicotine dependence: Secondary | ICD-10-CM | POA: Insufficient documentation

## 2020-10-10 DIAGNOSIS — Z79899 Other long term (current) drug therapy: Secondary | ICD-10-CM | POA: Diagnosis not present

## 2020-10-10 DIAGNOSIS — I2510 Atherosclerotic heart disease of native coronary artery without angina pectoris: Secondary | ICD-10-CM | POA: Diagnosis not present

## 2020-10-10 DIAGNOSIS — I10 Essential (primary) hypertension: Secondary | ICD-10-CM | POA: Diagnosis not present

## 2020-10-10 DIAGNOSIS — Z96653 Presence of artificial knee joint, bilateral: Secondary | ICD-10-CM | POA: Insufficient documentation

## 2020-10-10 DIAGNOSIS — E162 Hypoglycemia, unspecified: Secondary | ICD-10-CM

## 2020-10-10 DIAGNOSIS — R410 Disorientation, unspecified: Secondary | ICD-10-CM

## 2020-10-10 DIAGNOSIS — R55 Syncope and collapse: Secondary | ICD-10-CM | POA: Insufficient documentation

## 2020-10-10 LAB — CBC WITH DIFFERENTIAL/PLATELET
Abs Immature Granulocytes: 0.02 10*3/uL (ref 0.00–0.07)
Basophils Absolute: 0.1 10*3/uL (ref 0.0–0.1)
Basophils Relative: 1 %
Eosinophils Absolute: 0.3 10*3/uL (ref 0.0–0.5)
Eosinophils Relative: 4 %
HCT: 44.1 % (ref 39.0–52.0)
Hemoglobin: 14.1 g/dL (ref 13.0–17.0)
Immature Granulocytes: 0 %
Lymphocytes Relative: 23 %
Lymphs Abs: 1.7 10*3/uL (ref 0.7–4.0)
MCH: 31.8 pg (ref 26.0–34.0)
MCHC: 32 g/dL (ref 30.0–36.0)
MCV: 99.5 fL (ref 80.0–100.0)
Monocytes Absolute: 0.8 10*3/uL (ref 0.1–1.0)
Monocytes Relative: 11 %
Neutro Abs: 4.4 10*3/uL (ref 1.7–7.7)
Neutrophils Relative %: 61 %
Platelets: 213 10*3/uL (ref 150–400)
RBC: 4.43 MIL/uL (ref 4.22–5.81)
RDW: 13.3 % (ref 11.5–15.5)
WBC: 7.3 10*3/uL (ref 4.0–10.5)
nRBC: 0 % (ref 0.0–0.2)

## 2020-10-10 LAB — COMPREHENSIVE METABOLIC PANEL
ALT: 37 U/L (ref 0–44)
AST: 40 U/L (ref 15–41)
Albumin: 3.4 g/dL — ABNORMAL LOW (ref 3.5–5.0)
Alkaline Phosphatase: 70 U/L (ref 38–126)
Anion gap: 6 (ref 5–15)
BUN: 17 mg/dL (ref 8–23)
CO2: 21 mmol/L — ABNORMAL LOW (ref 22–32)
Calcium: 8.4 mg/dL — ABNORMAL LOW (ref 8.9–10.3)
Chloride: 112 mmol/L — ABNORMAL HIGH (ref 98–111)
Creatinine, Ser: 1.1 mg/dL (ref 0.61–1.24)
GFR, Estimated: >60 mL/min (ref >60)
Glucose, Bld: 65 mg/dL — ABNORMAL LOW (ref 70–99)
Potassium: 4.2 mmol/L (ref 3.5–5.1)
Sodium: 139 mmol/L (ref 135–145)
Total Bilirubin: 0.7 mg/dL (ref 0.3–1.2)
Total Protein: 5.5 g/dL — ABNORMAL LOW (ref 6.5–8.1)

## 2020-10-10 LAB — CBG MONITORING, ED: Glucose-Capillary: 103 mg/dL — ABNORMAL HIGH (ref 70–99)

## 2020-10-10 LAB — TROPONIN I (HIGH SENSITIVITY)
Troponin I (High Sensitivity): 9 ng/L (ref <18)
Troponin I (High Sensitivity): 9 ng/L (ref <18)

## 2020-10-10 IMAGING — MR MR HEAD W/O CM
12 of 13 series · 44 of 48 positions shown · non-contrast
Comparison: None.

CLINICAL DATA: Dizziness and altered mental status

EXAM:
MRI HEAD WITHOUT CONTRAST
TECHNIQUE: Multiplanar, multiecho pulse sequences of the brain and surrounding
structures were obtained without intravenous contrast.

[Series 5: DWI · axial · 3.0mm · 0.88mm/px · z∈[-73,+74]mm · 8 of 102 slices shown (1 of 4)]
[im 1/102]
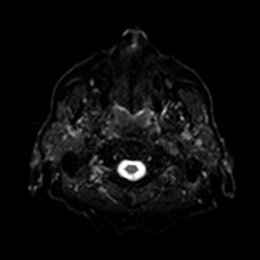
[im 15/102]
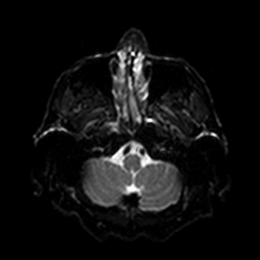
[im 29/102]
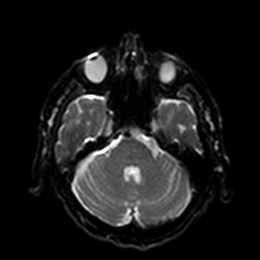
[im 44/102]
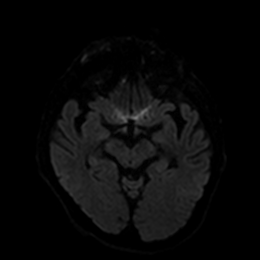
[im 58/102]
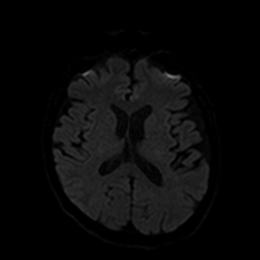
[im 73/102]
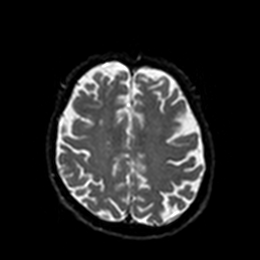
[im 87/102]
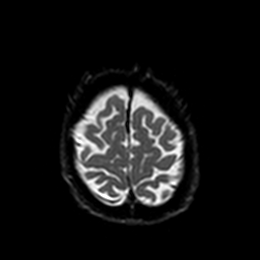
[im 102/102]
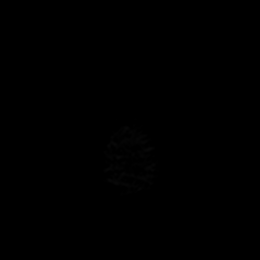

[Series 6: DWI · axial · 3.0mm · 0.88mm/px · z∈[-73,+74]mm · 4 of 51 slices shown (2 of 4)]
[im 1/51]
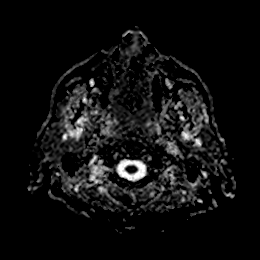
[im 17/51]
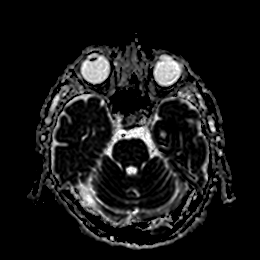
[im 34/51]
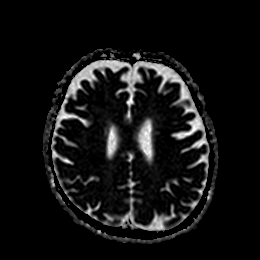
[im 51/51]
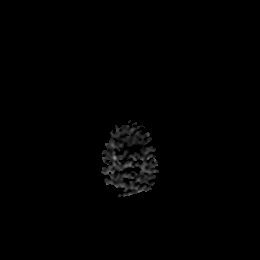

[Series 7: DWI · coronal · 4.0mm · 0.88mm/px · 5 of 68 slices shown (3 of 4)]
[im 1/68]
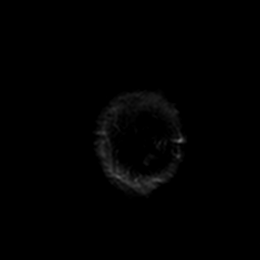
[im 17/68]
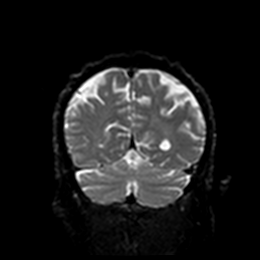
[im 34/68]
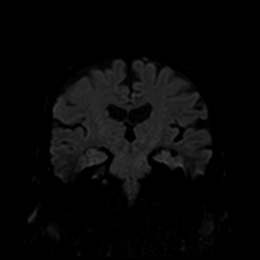
[im 51/68]
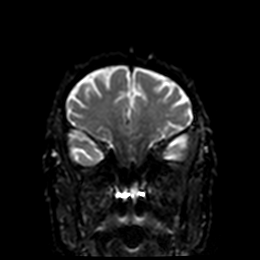
[im 68/68]
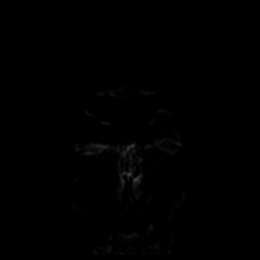

[Series 8: DWI · coronal · 4.0mm · 0.88mm/px · 3 of 34 slices shown (4 of 4)]
[im 1/34]
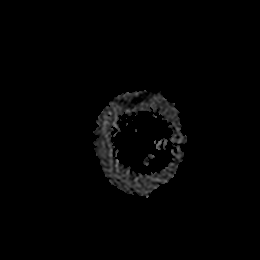
[im 17/34]
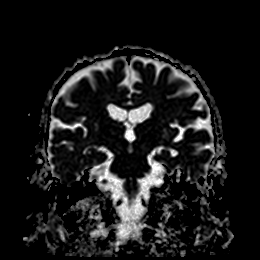
[im 34/34]
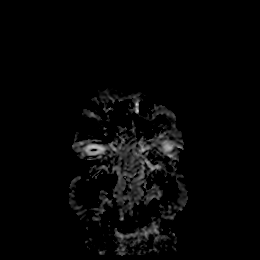

[Series 9: T1 · sagittal · 5.0mm · 0.75mm/px · 2 of 26 slices shown]
[im 1/26]
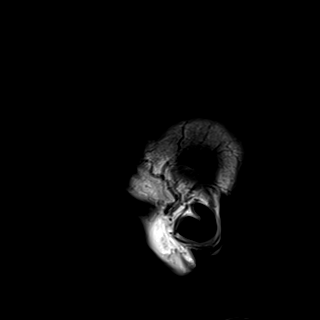
[im 26/26]
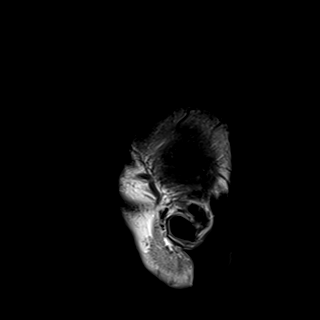

[Series 10: T2 · axial · 5.0mm · 0.72mm/px · z∈[-73,+74]mm · 2 of 26 slices shown (1 of 2)]
[im 1/26]
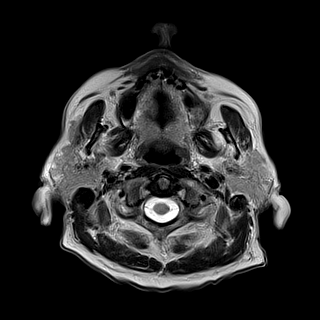
[im 26/26]
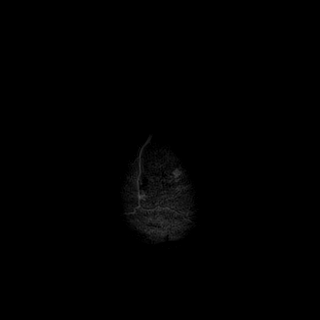

[Series 11: FLAIR · axial · 5.0mm · 0.45mm/px · z∈[-74,+73]mm · 2 of 26 slices shown]
[im 1/26]
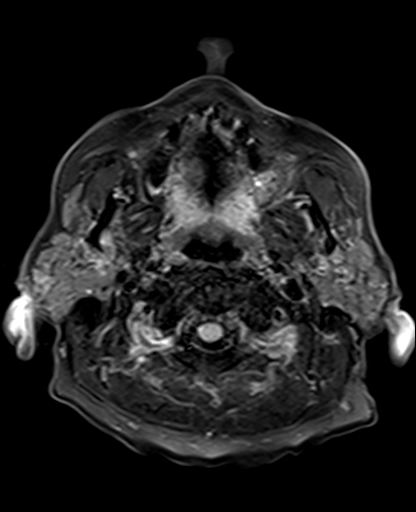
[im 26/26]
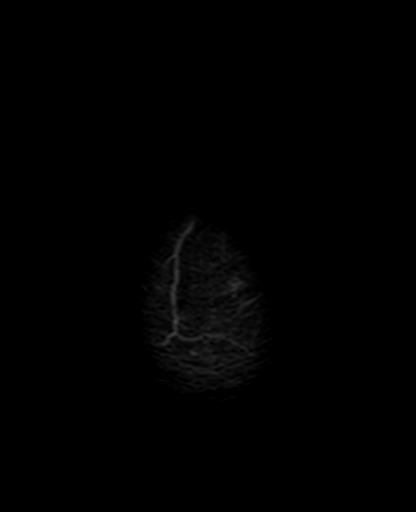

[Series 12: mag_images · axial · 3.0mm · 0.90mm/px · z∈[-81,+80]mm · 4 of 56 slices shown]
[im 1/56]
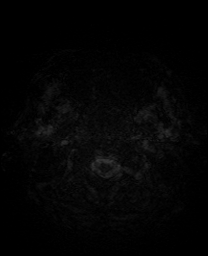
[im 19/56]
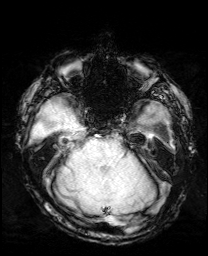
[im 37/56]
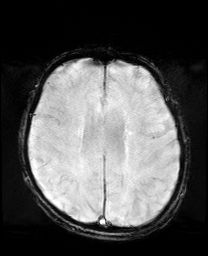
[im 56/56]
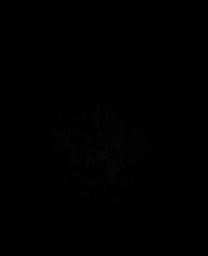

[Series 13: pha_images · axial · 3.0mm · 0.90mm/px · z∈[-81,+80]mm · 4 of 56 slices shown]
[im 1/56]
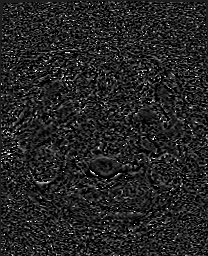
[im 19/56]
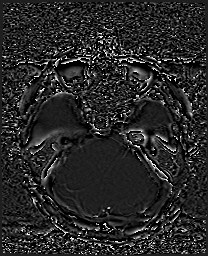
[im 37/56]
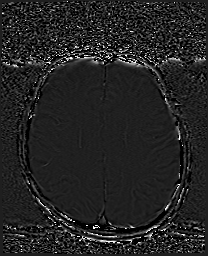
[im 56/56]
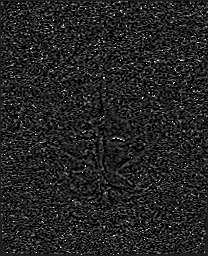

[Series 14: swi_images · axial · 3.0mm · 0.90mm/px · z∈[-81,+80]mm · 4 of 56 slices shown]
[im 1/56]
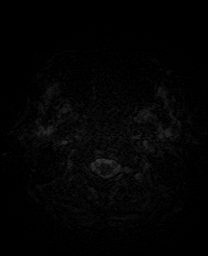
[im 19/56]
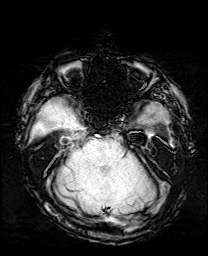
[im 37/56]
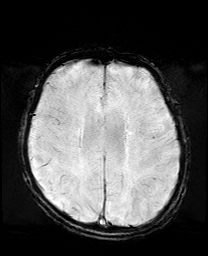
[im 56/56]
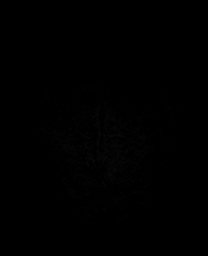

[Series 15: mip_images(sw) · axial · 24.0mm · 0.90mm/px · z∈[-71,+70]mm · 4 of 49 slices shown]
[im 1/49]
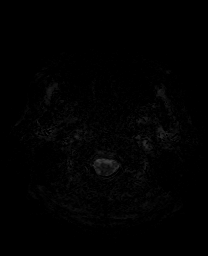
[im 17/49]
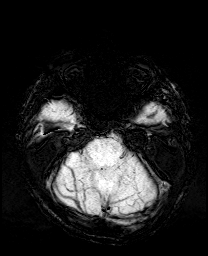
[im 33/49]
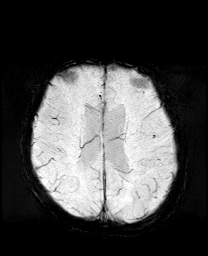
[im 49/49]
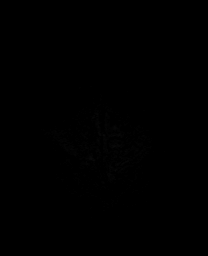

[Series 17: T2 · coronal · 5.0mm · 0.34mm/px · 2 of 29 slices shown (2 of 2)]
[im 1/29]
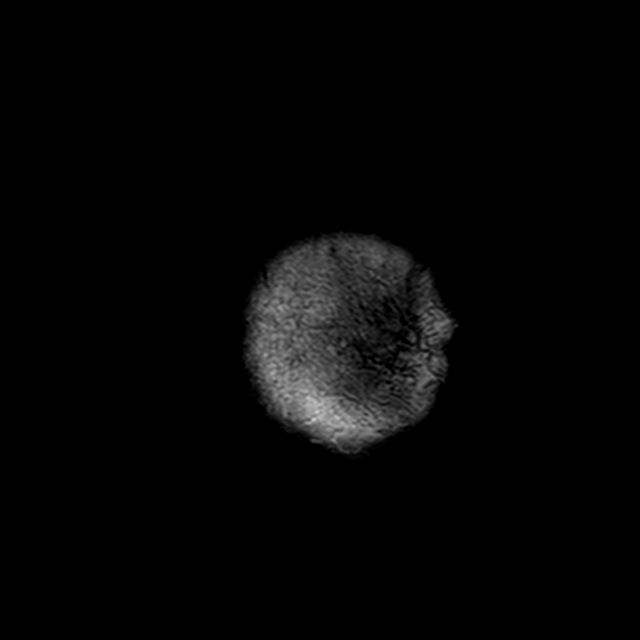
[im 29/29]
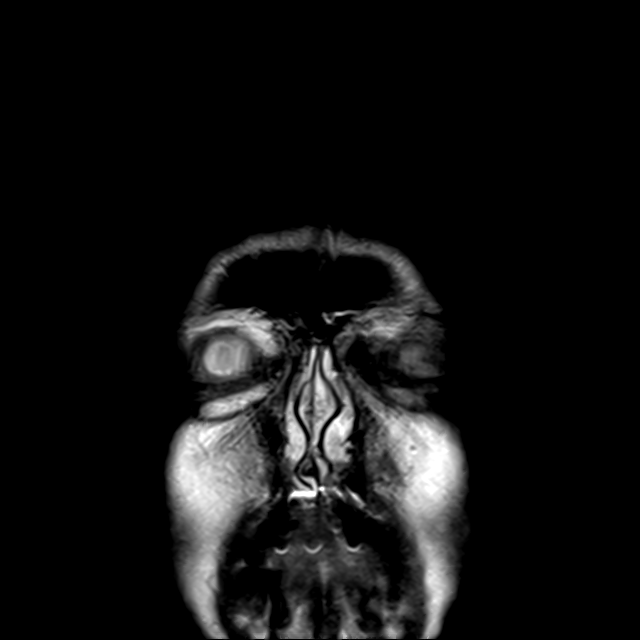

[44 of 48 positions shown; findings below may reference images not displayed]

FINDINGS: Brain: No acute infarct, mass effect or extra-axial collection. No
acute or chronic hemorrhage. There is multifocal hyperintense
T2-weighted signal within the white matter. Generalized volume loss
without a clear lobar predilection. The midline structures are
normal.

Vascular: Major flow voids are preserved.

Skull and upper cervical spine: Normal calvarium and skull base.
Visualized upper cervical spine and soft tissues are normal.

Sinuses/Orbits:No paranasal sinus fluid levels or advanced mucosal
thickening. No mastoid or middle ear effusion. Normal orbits.
IMPRESSION: 1. No acute intracranial abnormality.
2. Generalized volume loss and findings of chronic small vessel
disease.

## 2020-10-10 IMAGING — CT CT HEAD W/O CM
4 series · 16 of 47 positions shown, 18 images · non-contrast
Comparison: [DATE]

CLINICAL DATA: Dizziness, altered level of consciousness

EXAM:
CT HEAD WITHOUT CONTRAST
TECHNIQUE: Contiguous axial images were obtained from the base of the skull
through the vertex without intravenous contrast.

[Series 3: head bone · axial · 0.40mm/px · z∈[-111,-79]mm · 3 of 80 slices shown]
[im 8/80  bone]
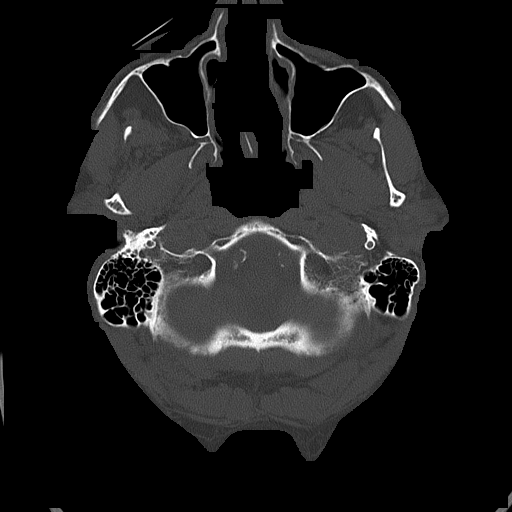
[im 16/80  bone]
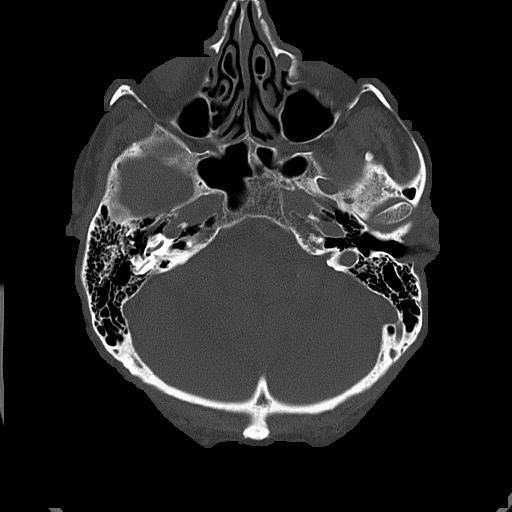
[im 24/80  bone]
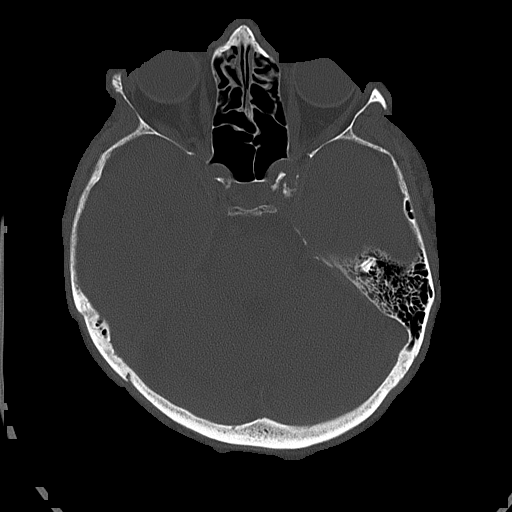

[Series 4: head without · axial · non-contrast · 0.40mm/px · z∈[-110,+10]mm · 7 of 32 slices shown, 9 images]
[im 4/32  brain]
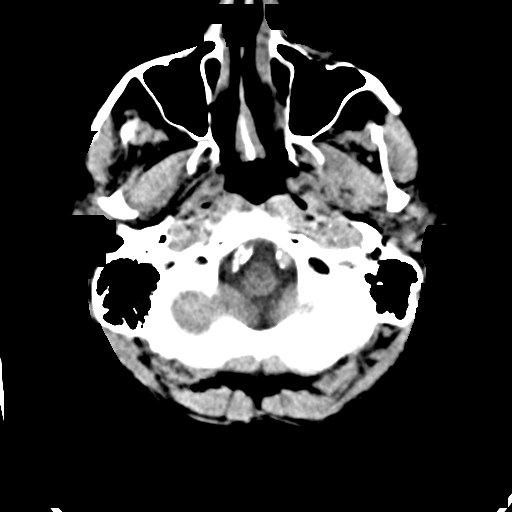
[im 4/32  bone]
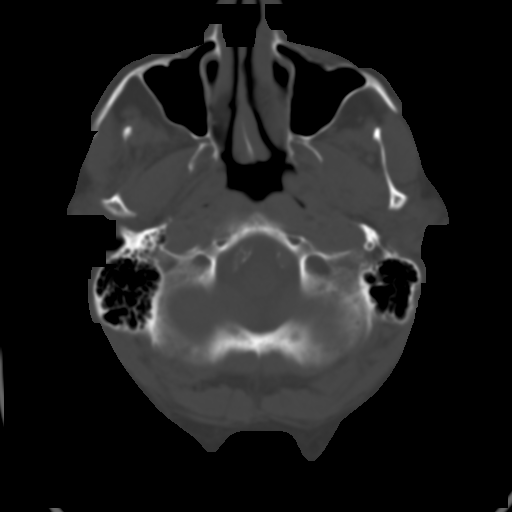
[im 8/32  brain]
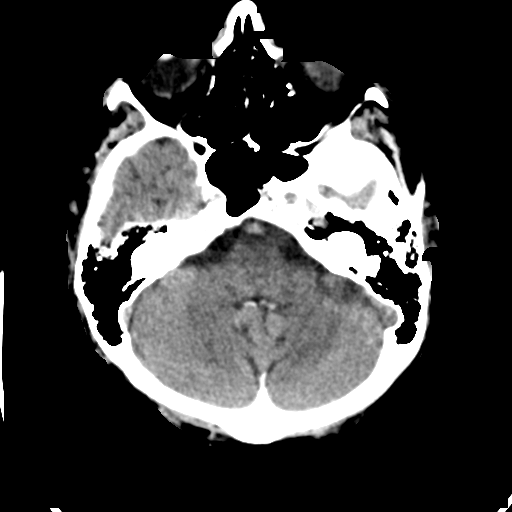
[im 12/32  brain]
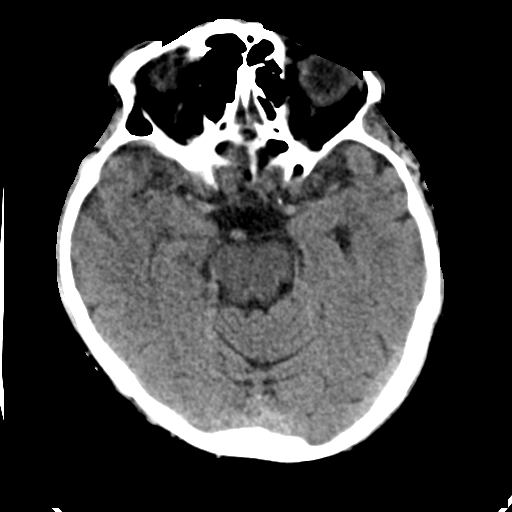
[im 16/32  brain]
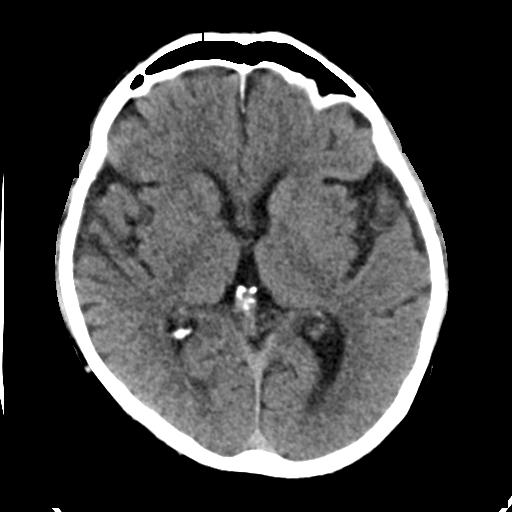
[im 20/32  brain]
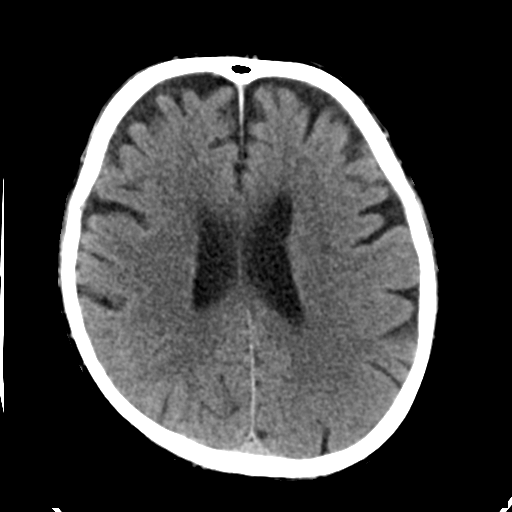
[im 20/32  bone]
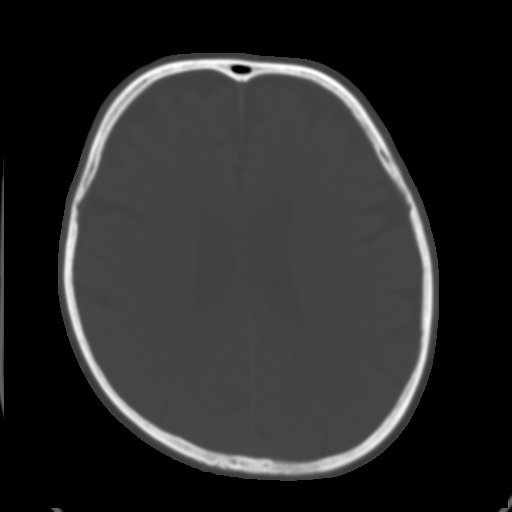
[im 24/32  brain]
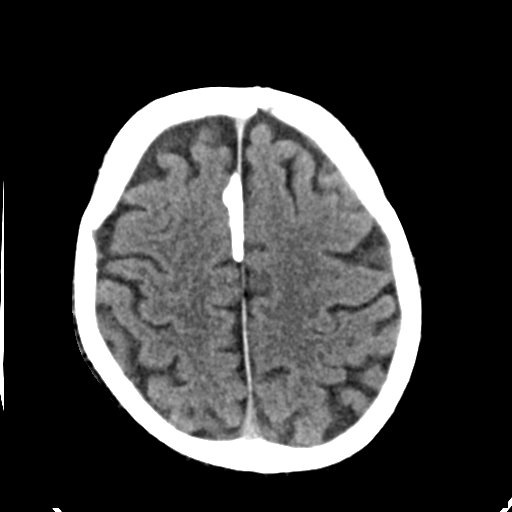
[im 28/32  brain]
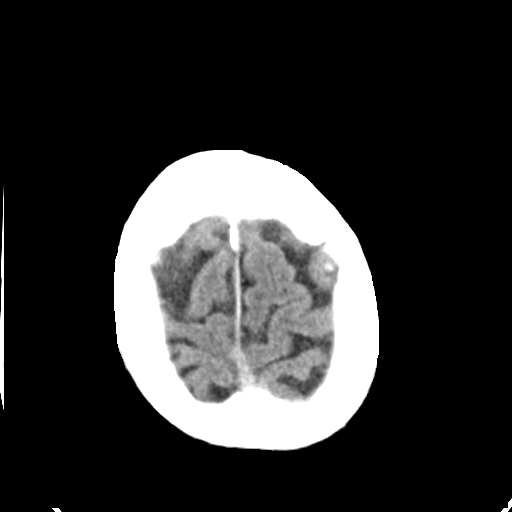

[Series 5: head without cor · coronal · non-contrast · 0.32mm/px · 3 of 67 slices shown]
[im 23/67  brain]
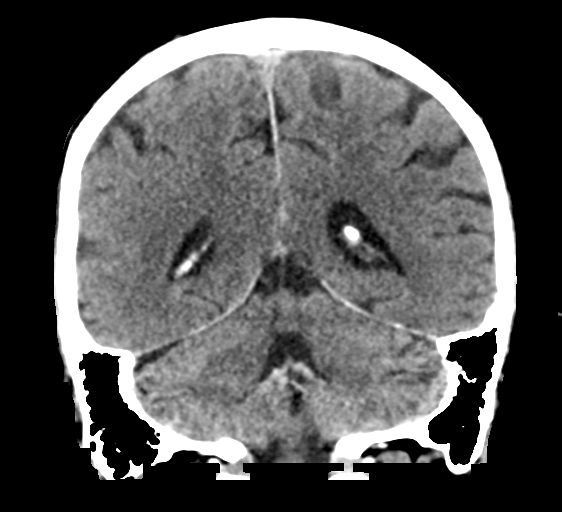
[im 30/67  brain]
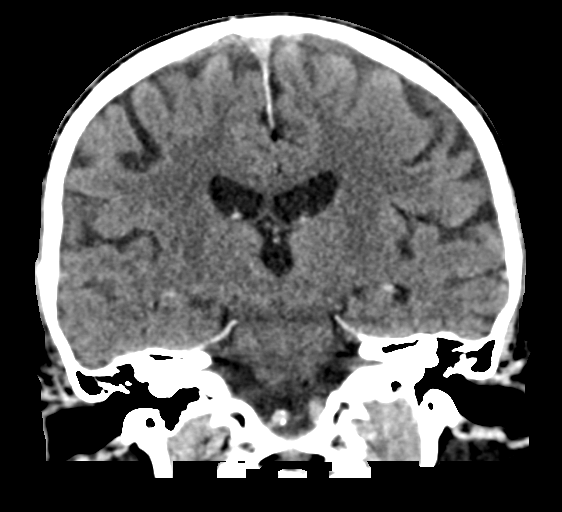
[im 37/67  brain]
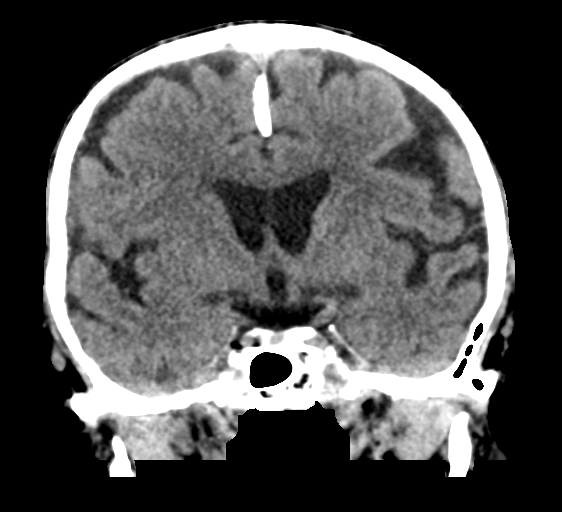

[Series 6: head without sag · sagittal · non-contrast · 0.29mm/px · 3 of 59 slices shown]
[im 20/59  brain]
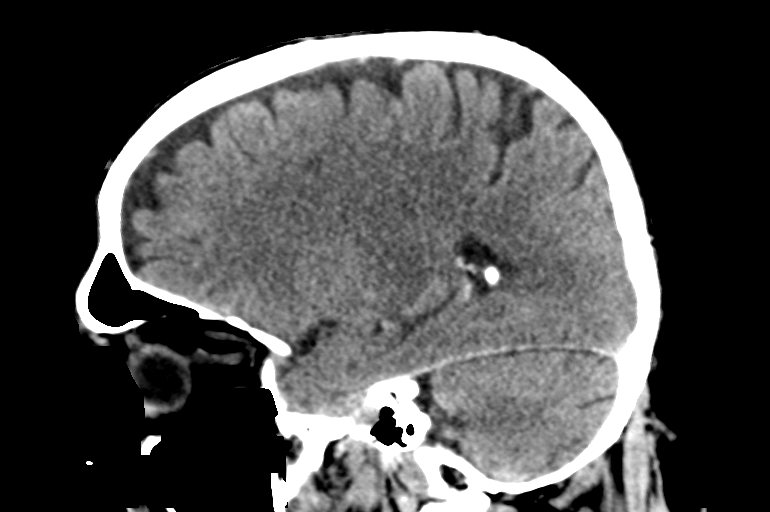
[im 30/59  brain]
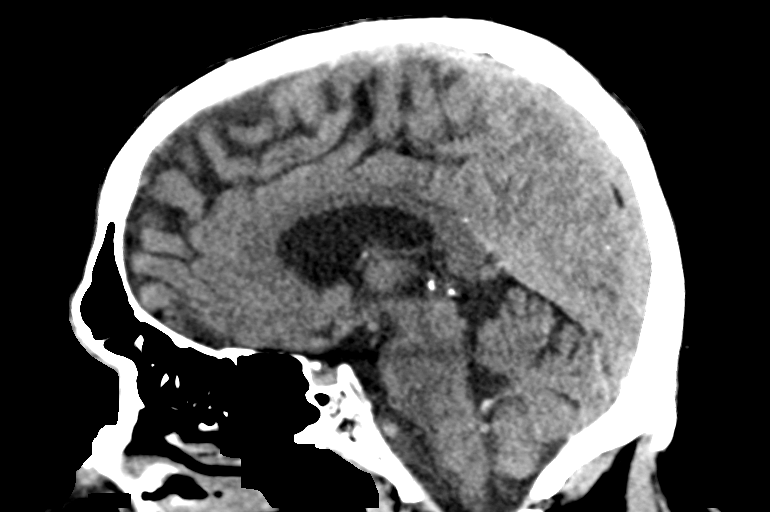
[im 39/59  brain]
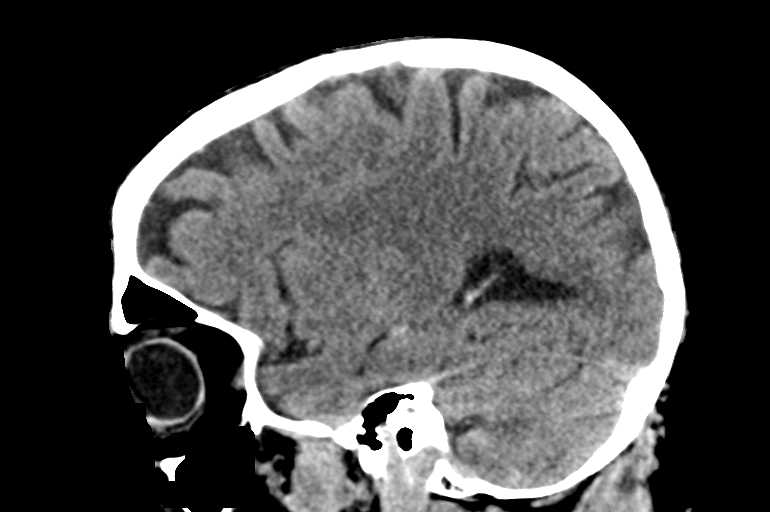

[16 of 47 positions shown; findings below may reference images not displayed]

FINDINGS: Brain: No acute infarct or hemorrhage. Lateral ventricles and
midline structures are unremarkable. No acute extra-axial fluid
collections. No mass effect.

Vascular: No hyperdense vessel or unexpected calcification.

Skull: Normal. Negative for fracture or focal lesion.

Sinuses/Orbits: Minimal mucosal thickening bilateral maxillary
sinuses. Remaining sinuses are clear.

Other: None.
IMPRESSION: 1. No acute intracranial process.

## 2020-10-10 MED ORDER — SODIUM CHLORIDE 0.9 % IV BOLUS
1000.0000 mL | Freq: Once | INTRAVENOUS | Status: AC
  Administered 2020-10-10: 1000 mL via INTRAVENOUS

## 2020-10-10 NOTE — Discharge Instructions (Signed)
Your blood sugar was low initially.  Please stay hydrated.  Recheck your blood sugar with your doctor in a week  See your doctor for follow-up  Return to ER if you have dizziness, passing out, chest pain

## 2020-10-10 NOTE — ED Notes (Signed)
Received verbal report from Cameron C RN at this time 

## 2020-10-10 NOTE — ED Triage Notes (Signed)
Pt bib [facility] from home where he lives with his wife. Pt was bending over fixing a tire and when he stood up he felt like he was "out of it". Pt could not answer questions appropriately for about 20 min. Afterwards pt was aox4. Presents AOx4. Denies pain [facility] vitals: 110/80, 74HR, 96% RA, original CBG 60- 15G oral glucose given.

## 2020-10-10 NOTE — ED Provider Notes (Signed)
[facility] EMERGENCY DEPARTMENT Provider Note   CSN: [id number] Arrival date & time: 10/10/20  1746     History No chief complaint on file.   Rudolph Daoust is a 75 y.o. male history of CAD, reflux, hyperlipidemia, hypertension who presented with near syncope.  Patient states that around 4:30 PM, he was at a gas station trying to pump up his tires and then he stood up and became altered.  The wife was at there with him and noticed that he was out of it and very confused.  He was looking at the hand sanitizer and not following directions and would not sit down.  No seizure activity.  Patient did not complain of any headache or chest pain.  Family then called the daughter, who went to see him at the gas station and by that time, symptoms have resolved.  Patient did not recall any of this event.  Daughter gave most of the history  The history is provided by the patient and a relative.      Past Medical History:  Diagnosis Date   Allergic rhinitis    Cataract    Coronary artery disease    Stent x1 2008   GERD (gastroesophageal reflux disease)    Hyperlipidemia    Hypertension     Patient Active Problem List   Diagnosis Date Noted   Esophageal dysphagia    Esophageal dysmotility 07/27/2015   GERD (gastroesophageal reflux disease) 05/23/2015   Cough 05/23/2015   Allergic rhinitis 05/23/2015   Essential hypertension 05/23/2015   LBBB (left bundle branch block) 05/23/2015   Coronary artery disease 05/23/2015    Past Surgical History:  Procedure Laterality Date   ABDOMINAL HERNIA REPAIR     CARDIAC CATHETERIZATION     stent   CORONARY ANGIOPLASTY WITH STENT PLACEMENT  2008   stent x1   ESOPHAGEAL MANOMETRY N/A 02/06/2016   Procedure: ESOPHAGEAL MANOMETRY (EM);  Surgeon: Ruffin Frederick, MD;  Location: [facility];  Service: Gastroenterology;  Laterality: N/A;   GASTRIC BYPASS  2005   GASTRIC BYPASS  2005   ROTATOR CUFF REPAIR Left 2007   TOTAL KNEE  ARTHROPLASTY Bilateral        Family History  Problem Relation Age of Onset   Breast cancer Mother    Diabetes Mother    Heart disease Mother    Prostate cancer Father    Colon cancer Father 33   Heart disease Father    Breast cancer Sister    Diabetes Sister    Lung disease Neg Hx    Esophageal cancer Neg Hx     Social History   Tobacco Use   Smoking status: Former    Packs/day: 1.00    Years: 18.00    Pack years: 18.00    Types: Cigarettes    Start date: 02/09/1964    Quit date: 05/23/1982    Years since quitting: 38.4   Smokeless tobacco: Never   Tobacco comments:    Also smoked 4-5 cigars for 10 years after cigarettes  Substance Use Topics   Alcohol use: Yes    Alcohol/week: 0.0 standard drinks    Comment: 1 glass wine a month   Drug use: No    Home Medications Prior to Admission medications   Medication Sig Start Date End Date Taking? Authorizing Provider  acetaminophen (TYLENOL) 500 MG tablet Take 1,000 mg by mouth every 6 (six) hours as needed for moderate pain or headache.   Yes [provider]  atorvastatin (LIPITOR) 40 MG tablet Take 1 tablet (40 mg total) by mouth daily. Patient taking differently: Take 40 mg by mouth every evening. 02/13/20  Yes Nahser, Deloris Ping, MD  carvedilol (COREG) 6.25 MG tablet Take 1 tablet (6.25 mg total) by mouth 2 (two) times daily with a meal. 02/13/20  Yes Nahser, Deloris Ping, MD  Cholecalciferol (VITAMIN D3) 250 MCG (10000 UT) TABS Take 1,000 Units by mouth in the morning and at bedtime.   Yes [provider]  clopidogrel (PLAVIX) 75 MG tablet Take 1 tablet (75 mg total) by mouth daily. 02/13/20  Yes Nahser, Deloris Ping, MD  CO-ENZYME Q-10 PO Take 200 mg by mouth daily.   Yes [provider]  ferrous sulfate 325 (65 FE) MG tablet Take 325 mg by mouth 2 (two) times daily with a meal. 09/02/17  Yes [provider]  pantoprazole (PROTONIX) 40 MG tablet TAKE 1 TABLET(40 MG) BY MOUTH DAILY. Patient taking  differently: Take 40 mg by mouth every evening. 07/23/20  Yes Nahser, Deloris Ping, MD  primidone (MYSOLINE) 50 MG tablet Take 50 mg by mouth 2 (two) times daily. 09/02/17  Yes [provider]  ranolazine (RANEXA) 500 MG 12 hr tablet Take 1 tablet (500 mg total) by mouth 2 (two) times daily. 02/13/20  Yes Nahser, Deloris Ping, MD  sacubitril-valsartan (ENTRESTO) 24-26 MG Take 1 tablet by mouth 2 (two) times daily. 03/05/20 02/28/21 Yes Nahser, Deloris Ping, MD  topiramate (TOPAMAX) 50 MG tablet Take 50 mg by mouth 2 (two) times daily. 02/01/20  Yes [provider]  vitamin C (ASCORBIC ACID) 500 MG tablet Take 500 mg by mouth 2 (two) times daily.   Yes [provider]    Allergies    Sulfa antibiotics and Tetanus toxoids  Review of Systems   Review of Systems  Psychiatric/Behavioral:  Positive for confusion.   All other systems reviewed and are negative.  Physical Exam Updated Vital Signs BP 116/68   Pulse 64   Temp 97.7 F (36.5 C) (Oral)   Resp 20   Ht 5\' 6"  (1.676 m)   Wt 95 kg   SpO2 100%   BMI 33.80 kg/m   Physical Exam Vitals and nursing note reviewed.  Constitutional:      Appearance: Normal appearance.     Comments: Awake, alert   HENT:     Head: Normocephalic.     Nose: Nose normal.     Mouth/Throat:     Mouth: Mucous membranes are moist.  Eyes:     Extraocular Movements: Extraocular movements intact.     Pupils: Pupils are equal, round, and reactive to light.  Cardiovascular:     Rate and Rhythm: Normal rate and regular rhythm.     Pulses: Normal pulses.     Heart sounds: Normal heart sounds.  Pulmonary:     Effort: Pulmonary effort is normal.     Breath sounds: Normal breath sounds.  Abdominal:     General: Abdomen is flat.     Palpations: Abdomen is soft.  Musculoskeletal:        General: Normal range of motion.     Cervical back: Normal range of motion and neck supple.  Skin:    General: Skin is warm.     Capillary Refill: Capillary refill  takes less than 2 seconds.  Neurological:     General: No focal deficit present.     Mental Status: He is alert and oriented to person, place, and time.  Comments: No obvious facial droop.  Normal strength bilateral arms or legs  Psychiatric:        Mood and Affect: Mood normal.        Behavior: Behavior normal.    ED Results / Procedures / Treatments   Labs (all labs ordered are listed, but only abnormal results are displayed) Labs Reviewed  COMPREHENSIVE METABOLIC PANEL - Abnormal; Notable for the following components:      Result Value   Chloride 112 (*)    CO2 21 (*)    Glucose, Bld 65 (*)    Calcium 8.4 (*)    Total Protein 5.5 (*)    Albumin 3.4 (*)    All other components within normal limits  CBG MONITORING, ED - Abnormal; Notable for the following components:   Glucose-Capillary 103 (*)    All other components within normal limits  CBC WITH DIFFERENTIAL/PLATELET  TROPONIN I (HIGH SENSITIVITY)  TROPONIN I (HIGH SENSITIVITY)    EKG EKG Interpretation  Date/Time:  Wednesday October 10 2020 17:48:02 EDT Ventricular Rate:  63 PR Interval:  200 QRS Duration: 177 QT Interval:  499 QTC Calculation: 511 R Axis:   -6 Text Interpretation: Sinus rhythm Left bundle branch block No significant change since last tracing Confirmed by Richardean Canal [phone]) on 10/10/2020 6:04:39 PM  Radiology CT HEAD WO CONTRAST ( )  Result Date: 10/10/2020 CLINICAL DATA:  Dizziness, altered level of consciousness EXAM: CT HEAD WITHOUT CONTRAST TECHNIQUE: Contiguous axial images were obtained from the base of the skull through the vertex without intravenous contrast. COMPARISON:  10/10/2020 FINDINGS: Brain: No acute infarct or hemorrhage. Lateral ventricles and midline structures are unremarkable. No acute extra-axial fluid collections. No mass effect. Vascular: No hyperdense vessel or unexpected calcification. Skull: Normal. Negative for fracture or focal lesion. Sinuses/Orbits: Minimal  mucosal thickening bilateral maxillary sinuses. Remaining sinuses are clear. Other: None. IMPRESSION: 1. No acute intracranial process. Electronically Signed   By: Sharlet Salina M.D.   On: 10/10/2020 19:48   MR BRAIN WO CONTRAST  Result Date: 10/10/2020 CLINICAL DATA:  Dizziness and altered mental status EXAM: MRI HEAD WITHOUT CONTRAST TECHNIQUE: Multiplanar, multiecho pulse sequences of the brain and surrounding structures were obtained without intravenous contrast. COMPARISON:  None. FINDINGS: Brain: No acute infarct, mass effect or extra-axial collection. No acute or chronic hemorrhage. There is multifocal hyperintense T2-weighted signal within the white matter. Generalized volume loss without a clear lobar predilection. The midline structures are normal. Vascular: Major flow voids are preserved. Skull and upper cervical spine: Normal calvarium and skull base. Visualized upper cervical spine and soft tissues are normal. Sinuses/Orbits:No paranasal sinus fluid levels or advanced mucosal thickening. No mastoid or middle ear effusion. Normal orbits. IMPRESSION: 1. No acute intracranial abnormality. 2. Generalized volume loss and findings of chronic small vessel disease. Electronically Signed   By: Deatra Robinson M.D.   On: 10/10/2020 19:41   DG Chest Port 1 View  Result Date: 10/10/2020 CLINICAL DATA:  Altered mental status. EXAM: PORTABLE CHEST 1 VIEW COMPARISON:  Chest x-ray 08/21/2015 FINDINGS: The heart and mediastinal contours are unchanged. Aortic calcification. Persistently dilated esophagus. Persistent left base airspace opacity. No pulmonary edema. No pleural effusion. No pneumothorax. No acute osseous abnormality. IMPRESSION: 1.  Persistently dilated esophagus. 2. Persistent left base airspace opacity. Electronically Signed   By: Tish Frederickson M.D.   On: 10/10/2020 18:38    Procedures Procedures   Medications Ordered in ED Medications  sodium chloride 0.9 % bolus 1,000 mL (1,000 mLs  Intravenous Bolus from Bag 10/10/20 1815)    ED Course  I have reviewed the triage vital signs and the nursing notes.  Pertinent labs & imaging results that were available during my care of the patient were reviewed by me and considered in my medical decision making (see chart for details).    MDM Rules/Calculators/A&P                           Lundon Verdejo is a 75 y.o. male here presenting with transient confusion.  Unclear what happened.  Consider vasovagal versus small stroke versus TGA.  Will get labs and MRI brain and chest x-ray.   9:59 PM Glucose was 65 initially.  MRI brain did not show a stroke.  Patient ate some food and glucose is up to 103 now.  I think his altered mental status is likely from hypoglycemia.  Patient is not diabetic and does not use insulin.  I told him to stay hydrated and eat normally.  He can recheck his chemistry function at PCPs office in a week.  Final Clinical Impression(s) / ED Diagnoses Final diagnoses:  None    Rx / DC Orders ED Discharge Orders     None        Charlynne Pander, MD 10/10/20 2200

## 2020-10-10 NOTE — ED Notes (Signed)
Remains in MRI 

## 2020-12-02 ENCOUNTER — Encounter: Payer: Self-pay | Admitting: Cardiovascular Disease

## 2020-12-02 NOTE — Progress Notes (Signed)
This encounter was created in error - please disregard.

## 2020-12-03 ENCOUNTER — Encounter: Payer: Medicare Other | Admitting: Cardiovascular Disease

## 2020-12-17 ENCOUNTER — Encounter: Payer: Self-pay | Admitting: Cardiovascular Disease

## 2020-12-17 NOTE — Progress Notes (Signed)
Cardiology Office Note:    Date:  12/18/2020   ID:  Ian [name], DOB 1945/03/11, MRN [id number]  PCP:  Gaspar Garbe, MD  Cardiologist:  Kristeen Miss, MD  Electrophysiologist:  None   Referring MD:  T[place]   Problem list 1.  Coronary artery disease 2.  Hyperlipidemia 3.  Hypertension 4.  Left bundle branch block 5.  Hyperlipidemia 6. Gastric bypass - ~2005 7  Anemia ( iron def. B12 deficiency )   Chief Complaint  Patient presents with   Coronary Artery Disease        Ian [name] is a 75 y.o. male with a hx of   CAD, HTN, LBBB Just moved from [place].  Seen with wife [name].  Ian [name] is seen today for the first time.  He has a history of coronary artery disease.  He has a history of stenting in August, 2019 in [place]. He is felt well following the stents. Recent  ( Sept. 2017, 2019) total cholesterol level is 90.  The HDL is 35.  The LDL is 46.  The triglyceride level is 44.  Retired Presenter, broadcasting / Surveyor, minerals  Works in his [employer] now  No real exercise   OCT. 27, 2020: Ian [name] is seen today for follow-up of his coronary artery disease.  He has a history of hyperlipidemia.  He has a stable LBBB.  Busy in his wood shop Has been diagnosed with essential tremor ( is on Primidone)  We discussed drug interactions with his multiple cardiac meds.  No CP since his stenting several years ago   Jan. 31, 2022: Ian [name] is seen today for follow up of his CAD, HLD. LBBB  Hx of coronary stenting in 2019.  Still doing lots of woodworking .    He told me about a new table saw - the sawstop  Is very active in his shop but no real cardio exercise   June 29, 2020: Ian [name] is seen today for follow up of his CAD and LBBB, HTN Echo on Feb. 18, 2022 shows mildly reduced LV EF (40-45%) and he was started on Entresto 24-26 BID He has mild AS Is losing some weight  Is on Primidone and topamax  Has lost 20 pounds .  Dec. 6, 2022 Ian [name] is seen today for follow up of his CAD, LBBB,  HTN Echo in Feb. 2022 sows EF 40-45%. Was started on Entresto 24-26 BID   He had an episode of orthostatic hypotension while checking her wife's tires .  Had lost 35-40 lbs .  CT of the head was normal,  MRI of brain was normal  Was seen by Dr. Wylene Simmer.  Coreg was reduced to 3.125 BID  Feels better , no further episodes of syncope  He check his bp regularly ,    Past Medical History:  Diagnosis Date   Allergic rhinitis    Cataract    Coronary artery disease    Stent x1 2008   GERD (gastroesophageal reflux disease)    Hyperlipidemia    Hypertension     Past Surgical History:  Procedure Laterality Date   ABDOMINAL HERNIA REPAIR     CARDIAC CATHETERIZATION     stent   CORONARY ANGIOPLASTY WITH STENT PLACEMENT  2008   stent x1   ESOPHAGEAL MANOMETRY N/A 02/06/2016   Procedure: ESOPHAGEAL MANOMETRY (EM);  Surgeon: Ruffin Frederick, MD;  Location: [facility];  Service: Gastroenterology;  Laterality: N/A;   GASTRIC BYPASS  2005   GASTRIC BYPASS  2005  ROTATOR CUFF REPAIR Left 2007   TOTAL KNEE ARTHROPLASTY Bilateral     Current Medications: Current Meds  Medication Sig   atorvastatin (LIPITOR) 40 MG tablet Take 1 tablet (40 mg total) by mouth daily.   carvedilol (COREG) 6.25 MG tablet Take 1 tablet (6.25 mg total) by mouth 2 (two) times daily with a meal. (Patient taking differently: Take 3.25 mg by mouth 2 (two) times daily with a meal.)   Cholecalciferol (VITAMIN D3) 250 MCG (10000 UT) TABS Take 1,000 Units by mouth in the morning and at bedtime.   clopidogrel (PLAVIX) 75 MG tablet Take 1 tablet (75 mg total) by mouth daily.   CO-ENZYME Q-10 PO Take 200 mg by mouth daily.   ferrous sulfate 325 (65 FE) MG tablet Take 325 mg by mouth 2 (two) times daily with a meal.   pantoprazole (PROTONIX) 40 MG tablet TAKE 1 TABLET(40 MG) BY MOUTH DAILY.   ranolazine (RANEXA) 500 MG 12 hr tablet Take 1 tablet (500 mg total) by mouth 2 (two) times daily.   sacubitril-valsartan  (ENTRESTO) 24-26 MG Take 1 tablet by mouth 2 (two) times daily.   Current Facility-Administered Medications for the 12/18/20 encounter (Office Visit) with Jadelyn Elks, Deloris Ping, MD  Medication   0.9 %  sodium chloride infusion     Allergies:   Sulfa antibiotics and Tetanus toxoids   Social History   Socioeconomic History   Marital status: Married    Spouse name: Not on file   Number of children: 2   Years of education: Not on file   Highest education level: Not on file  Occupational History   Occupation: brick layer  Tobacco Use   Smoking status: Former    Packs/day: 1.00    Years: 18.00    Pack years: 18.00    Types: Cigarettes    Start date: 02/09/1964    Quit date: 05/23/1982    Years since quitting: 38.6   Smokeless tobacco: Never   Tobacco comments:    Also smoked 4-5 cigars for 10 years after cigarettes  Substance and Sexual Activity   Alcohol use: Yes    Alcohol/week: 0.0 standard drinks    Comment: 1 glass wine a month   Drug use: No   Sexual activity: Not on file  Other Topics Concern   Not on file  Social History Narrative   Originally from [place]. Has lived in [place], [place]. Previously has traveled to [place], [place], [place], [place], & on [place] cruises. Currently works as a Designer, fashion/clothing. No pets currently. Remote bird exposure. No mold or hot tub exposure. No known asbestos exposure.    Social Determinants of Health   Financial Resource Strain: Not on file  Food Insecurity: Not on file  Transportation Needs: Not on file  Physical Activity: Not on file  Stress: Not on file  Social Connections: Not on file     Family History: The patient's family history includes Breast cancer in his mother and sister; Colon cancer (age of onset: 78) in his father; Diabetes in his mother and sister; Heart disease in his father and mother; Prostate cancer in his father. There is no history of Lung disease or Esophageal cancer.  ROS:   Please see the history of present  illness.     All other systems reviewed and are negative.  EKGs/Labs/Other Studies Reviewed:    The following studies were reviewed today:    Recent Labs: 10/10/2020: ALT 37; BUN 17; Creatinine, Ser 1.10; Hemoglobin 14.1; Platelets 213;  Potassium 4.2; Sodium 139  Recent Lipid Panel    Component Value Date/Time   CHOL 116 02/06/2020 0835   TRIG 48 02/06/2020 0835   HDL 48 02/06/2020 0835   CHOLHDL 2.4 02/06/2020 0835   LDLCALC 56 02/06/2020 0835    Physical Exam: Blood pressure 122/62, pulse 83, height 5\' 6"  (1.676 m), weight 199 lb 12.8 oz (90.6 kg), SpO2 96 %.  GEN:  Well nourished, well developed in no acute distress HEENT: Normal NECK: No JVD; No carotid bruits LYMPHATICS: No lymphadenopathy CARDIAC: RRR , no murmurs, rubs, gallops RESPIRATORY:  Clear to auscultation without rales, wheezing or rhonchi  ABDOMEN: Soft, non-tender, non-distended MUSCULOSKELETAL:  No edema; No deformity  SKIN: Warm and dry NEUROLOGIC:  Alert and oriented x 3    ECG:        ASSESSMENT:    1. Coronary artery disease involving native coronary artery of native heart without angina pectoris   2. Chronic combined systolic and diastolic heart failure (HCC)      PLAN:      1.  coronary artery disease:   s/p stenting in 2019.   Will DC ASA,  cont plavix .   No angina     2.    Hyperlipidemia:  stable,  check labs in 6 months    3.  Chronic systolic congestive heart failure: Ian [name] has longstanding left bundle branch block.  His last echo showed mildly reduced left ventricular systolic function with an EF of 40 to 45%.  We started him on Entresto 24-26 twice a day.  He was on carvedilol 6.25 mg twice a day.  He had he had an episode of orthostatic hypotension and his primary medical doctor reduced the carvedilol to 3.125 mg twice a day.  So far he seems to be stable.  We will get an echocardiogram to assess how his LV function is doing.  Anticipate seeing him again in 3 months.   Continue current medications for now.      Medication Adjustments/Labs and Tests Ordered: Current medicines are reviewed at length with the patient today.  Concerns regarding medicines are outlined above.  Orders Placed This Encounter  Procedures   ECHOCARDIOGRAM COMPLETE     No orders of the defined types were placed in this encounter.    Patient Instructions  Medication Instructions:  Your physician recommends that you continue on your current medications as directed. Please refer to the Current Medication list given to you today.  *If you need a refill on your cardiac medications before your next appointment, please call your pharmacy*   Lab Work: None If you have labs (blood work) drawn today and your tests are completely normal, you will receive your results only by: MyChart Message (if you have MyChart) OR A paper copy in the mail If you have any lab test that is abnormal or we need to change your treatment, we will call you to review the results.   Testing/Procedures: Your physician has requested that you have an echocardiogram. Echocardiography is a painless test that uses sound waves to create images of your heart. It provides your doctor with information about the size and shape of your heart and how well your heart's chambers and valves are working. This procedure takes approximately one hour. There are no restrictions for this procedure.   Follow-Up: At [facility], you and your health needs are our priority.  As part of our continuing mission to provide you with exceptional heart care, we have created  designated Provider Care Teams.  These Care Teams include your primary Cardiologist (physician) and Advanced Practice Providers (APPs -  Physician Assistants and Nurse Practitioners) who all work together to provide you with the care you need, when you need it.  We recommend signing up for the patient portal called "MyChart".  Sign up information is provided on  this After Visit Summary.  MyChart is used to connect with patients for Virtual Visits (Telemedicine).  Patients are able to view lab/test results, encounter notes, upcoming appointments, etc.  Non-urgent messages can be sent to your provider as well.   To learn more about what you can do with MyChart, go to [email or web address].    Your next appointment:   6 month(s)- please come fasting to this appointment as we will plan to draw labs.  Nothing to eat or drink after midnight except water and black coffee.  The format for your next appointment:   In Person  Provider:   Kristeen Miss, MD     Other Instructions     Signed, Kristeen Miss, MD  12/18/2020 9:46 PM    [facility]

## 2020-12-18 ENCOUNTER — Encounter: Payer: Self-pay | Admitting: Cardiovascular Disease

## 2020-12-18 ENCOUNTER — Ambulatory Visit: Payer: Medicare Other | Admitting: Cardiovascular Disease

## 2020-12-18 ENCOUNTER — Other Ambulatory Visit: Payer: Self-pay

## 2020-12-18 VITALS — BP 122/62 | HR 83 | Ht 66.0 in | Wt 199.8 lb

## 2020-12-18 DIAGNOSIS — I5042 Chronic combined systolic (congestive) and diastolic (congestive) heart failure: Secondary | ICD-10-CM

## 2020-12-18 DIAGNOSIS — I2510 Atherosclerotic heart disease of native coronary artery without angina pectoris: Secondary | ICD-10-CM

## 2020-12-18 NOTE — Patient Instructions (Signed)
Medication Instructions:  Your physician recommends that you continue on your current medications as directed. Please refer to the Current Medication list given to you today.  *If you need a refill on your cardiac medications before your next appointment, please call your pharmacy*   Lab Work: None If you have labs (blood work) drawn today and your tests are completely normal, you will receive your results only by: MyChart Message (if you have MyChart) OR A paper copy in the mail If you have any lab test that is abnormal or we need to change your treatment, we will call you to review the results.   Testing/Procedures: Your physician has requested that you have an echocardiogram. Echocardiography is a painless test that uses sound waves to create images of your heart. It provides your doctor with information about the size and shape of your heart and how well your heart's chambers and valves are working. This procedure takes approximately one hour. There are no restrictions for this procedure.   Follow-Up: At [facility], you and your health needs are our priority.  As part of our continuing mission to provide you with exceptional heart care, we have created designated Provider Care Teams.  These Care Teams include your primary Cardiologist (physician) and Advanced Practice Providers (APPs -  Physician Assistants and Nurse Practitioners) who all work together to provide you with the care you need, when you need it.  We recommend signing up for the patient portal called "MyChart".  Sign up information is provided on this After Visit Summary.  MyChart is used to connect with patients for Virtual Visits (Telemedicine).  Patients are able to view lab/test results, encounter notes, upcoming appointments, etc.  Non-urgent messages can be sent to your provider as well.   To learn more about what you can do with MyChart, go to [email or web address].    Your next appointment:   6 month(s)- please  come fasting to this appointment as we will plan to draw labs.  Nothing to eat or drink after midnight except water and black coffee.  The format for your next appointment:   In Person  Provider:   Kristeen Miss, MD     Other Instructions

## 2020-12-20 NOTE — Progress Notes (Signed)
Assessment/Plan:   1.  Essential Tremor.  -This is evidenced by the symmetrical nature and longstanding hx of gradually getting worse.  Patient is not a candidate for propranolol because of bradycardia in the past and low BP currently (and he is on carvedilol).  He is not a candidate for primidone because of interactions with Ranexa (was previously on it).  This leaves only second line medications, unfortunately.  He is already on topamax, 50 mg bid.  The patient worried about his history of kidney stones.  Discussed that topamax does increase risk of kidney stones but not all types.  He is worried about that and wants to look at something else. We ultimately decided on gabapentin.  He was given the following schedule:  Week 1 and 2: Continue topamax 50 mg twice per day Start gabapentin 300 mg at bedtime  Week 3 and 4: Continue topamax 50 mg twice per day Increase gabapentin to 300 mg twice per day  Week 5: Decrease topamax to 50 mg once per day Continue gabapentin 300 mg twice per day  Week 6 and beyond: Stop topamax Continue gabapentin 300 mg twice per day  Subjective:   Ian [name] was seen in consultation in the movement disorder clinic at the request of Orson Aloe, MD.  The evaluation is for tremor.  Patient has previously seen neurology in [place], [place], but has moved here and desires to transition care.  Records from October 19, 2020 from [place] neurologist are reviewed.  Records from that visit indicate that patient was concerned that his primidone would interact with his cardiac medications (was on 50 mg twice per day) and he was weaned off of that.  His topiramate was continued at 50 mg twice per day.  He did state that tremor was a little worse once primidone weaned off.  He called his neurologist about that.    Tremor started approximately 4-5 years ago and involves the bilateral UE.  He is R hand dominant.  Tremor is most noticeable when writing and picking up  objects.  Tremor comes and goes - better and worse days   There is a family hx of tremor "in my parents as I got older."    Affected by caffeine:  not sure - "I drink iced tea all of the time" Affected by alcohol:  No. Affected by stress:  Yes.   Affected by fatigue:  Yes.   Spills soup if on spoon:  may Spills glass of liquid if full:  No. Affects ADL's (tying shoes, brushing teeth, etc):  No.  Current/Previously tried tremor medications: Primidone (taken off of because of potential interaction with Ranexa)  Voice: weak and hoarse Postural symptoms:  Yes.  , "I attribute that to age"  Falls?  No. Bradykinesia symptoms: no bradykinesia noted Loss of smell:  Yes.   Loss of taste:  No. Difficulty Swallowing:  Yes.  , " I had a test for swallowing because I don't swallow well" Memory changes:  No. N/V:  No. Lightheaded:  had near syncope about 2.5 months ago - went to hospital - told due to weight loss (from topamax) and they cut back on carvidilol Diplopia:  No. Dyskinesia:  No.   Outside reports reviewed: office notes and referral letter/letters.  Allergies  Allergen Reactions   Sulfa Antibiotics Other (See Comments)    As child; unknown   Tetanus Toxoids     The horse serum for tetanus    Current Outpatient Medications  Medication Instructions  atorvastatin (LIPITOR) 40 mg, Oral, Daily   carvedilol (COREG) 6.25 mg, Oral, 2 times daily with meals   clopidogrel (PLAVIX) 75 mg, Oral, Daily   CO-ENZYME Q-10 PO 200 mg, Oral, Daily   ferrous sulfate 325 mg, Oral, 2 times daily with meals   pantoprazole (PROTONIX) 40 MG tablet TAKE 1 TABLET(40 MG) BY MOUTH DAILY.   primidone (MYSOLINE) 50 mg, 2 times daily   ranolazine (RANEXA) 500 mg, Oral, 2 times daily   sacubitril-valsartan (ENTRESTO) 24-26 MG 1 tablet, Oral, 2 times daily   topiramate (TOPAMAX) 50 mg, Oral, 2 times daily   Vitamin D3 1,000 Units, Oral, 2 times daily     Objective:   VITALS:   Vitals:   12/21/20  0856  BP: 104/71  Pulse: 75  SpO2: 97%  Weight: 199 lb (90.3 kg)  Height: 5\' 6"  (1.676 m)   Gen:  Appears stated age and in NAD. HEENT:  Normocephalic, atraumatic. The mucous membranes are moist. The superficial temporal arteries are without ropiness or tenderness. Cardiovascular: Regular rate and rhythm. Lungs: Clear to auscultation bilaterally. Neck: There are no carotid bruits noted bilaterally.  NEUROLOGICAL:  Orientation:  The patient is alert and oriented x 3.   Cranial nerves: There is good facial symmetry. Extraocular muscles are intact and visual fields are full to confrontational testing. Speech is fluent and clear. There is vocal tremor.  Soft palate rises symmetrically and there is no tongue deviation. Hearing is intact to conversational tone. Tone: Tone is good throughout. Sensation: Sensation is intact to light touch touch throughout (facial, trunk, extremities). Vibration is intact at the bilateral ankle (decreased at the toe). There is no extinction with double simultaneous stimulation. There is no sensory dermatomal level identified. Coordination:  The patient has no dysdiadichokinesia or dysmetria. Motor: Strength is 5/5 in the bilateral upper and lower extremities.  Shoulder shrug is equal bilaterally.  There is no pronator drift.  There are no fasciculations noted. DTR's: Deep tendon reflexes are 1/4 at the bilateral biceps, triceps, brachioradialis, patella and achilles.  Plantar responses are downgoing bilaterally. Gait and Station: The patient is able to ambulate without difficulty.   MOVEMENT EXAM: Tremor:  There is a slight intermittent rest tremor on the L.  There is tremor in the UE, noted most significantly with action.  The patient has some evidence of tremor with Archimedes spirals bilaterally.  The patient is able to pour water from one glass to another without spilling it but he does have tremor.  I have reviewed and interpreted the following labs  independently   Chemistry      Component Value Date/Time   NA 139 10/10/2020 1757   NA 142 06/29/2020 0909   K 4.2 10/10/2020 1757   CL 112 (H) 10/10/2020 1757   CO2 21 (L) 10/10/2020 1757   BUN 17 10/10/2020 1757   BUN 18 06/29/2020 0909   CREATININE 1.10 10/10/2020 1757      Component Value Date/Time   CALCIUM 8.4 (L) 10/10/2020 1757   ALKPHOS 70 10/10/2020 1757   AST 40 10/10/2020 1757   ALT 37 10/10/2020 1757   BILITOT 0.7 10/10/2020 1757   BILITOT 0.5 02/06/2020 0835      Lab Results  Component Value Date   WBC 7.3 10/10/2020   HGB 14.1 10/10/2020   HCT 44.1 10/10/2020   MCV 99.5 10/10/2020   PLT 213 10/10/2020   No results found for: TSH    Total time spent on today's visit was 10/12/2020,  including both face-to-face time and nonface-to-face time.  Time included that spent on review of records (prior notes available to me/labs/imaging if pertinent), discussing treatment and goals, answering patient's questions and coordinating care.  CC:  Tisovec, Adelfa Koh, MD

## 2020-12-21 ENCOUNTER — Ambulatory Visit: Payer: Medicare Other | Admitting: Neurology

## 2020-12-21 ENCOUNTER — Telehealth: Payer: Self-pay | Admitting: Neurology

## 2020-12-21 ENCOUNTER — Other Ambulatory Visit: Payer: Self-pay

## 2020-12-21 ENCOUNTER — Encounter: Payer: Self-pay | Admitting: Neurology

## 2020-12-21 VITALS — BP 104/71 | HR 75 | Ht 66.0 in | Wt 199.0 lb

## 2020-12-21 DIAGNOSIS — Z87442 Personal history of urinary calculi: Secondary | ICD-10-CM | POA: Diagnosis not present

## 2020-12-21 DIAGNOSIS — G250 Essential tremor: Secondary | ICD-10-CM

## 2020-12-21 MED ORDER — GABAPENTIN 300 MG PO CAPS: ORAL_CAPSULE | ORAL | 1 refills | Status: DC

## 2020-12-21 NOTE — Patient Instructions (Signed)
Week 1 and 2: Continue topamax 50 mg twice per day Start gabapentin 300 mg at bedtime  Week 3 and 4: Continue topamax 50 mg twice per day Increase gabapentin to 300 mg twice per day  Week 5: Decrease topamax to 50 mg once per day Continue gabapentin 300 mg twice per day  Week 6 and beyond: Stop topamax Continue gabapentin 300 mg twice per day

## 2020-12-21 NOTE — Telephone Encounter (Signed)
Spoke with pharmacy and script fixed to say one tablet q hs x 2 weeks then 1 tab BID

## 2020-12-21 NOTE — Telephone Encounter (Signed)
Patient's pharmacy called and requested to clarify directions on gabapentin.

## 2021-01-17 ENCOUNTER — Ambulatory Visit (HOSPITAL_COMMUNITY): Payer: Medicare Other | Attending: Internal Medicine

## 2021-01-17 ENCOUNTER — Other Ambulatory Visit: Payer: Self-pay

## 2021-01-17 DIAGNOSIS — I5042 Chronic combined systolic (congestive) and diastolic (congestive) heart failure: Secondary | ICD-10-CM

## 2021-01-17 LAB — ECHOCARDIOGRAM COMPLETE
AR max vel: 2.22 cm2
AV Area VTI: 2.41 cm2
AV Area mean vel: 2.17 cm2
AV Mean grad: 10.7 mmHg
AV Peak grad: 20.1 mmHg
Ao pk vel: 2.24 m/s
Area-P 1/2: 2.91 cm2
S' Lateral: 3.6 cm

## 2021-01-28 ENCOUNTER — Other Ambulatory Visit: Payer: Self-pay

## 2021-01-28 ENCOUNTER — Ambulatory Visit: Payer: Medicare Other | Admitting: Cardiovascular Disease

## 2021-01-28 ENCOUNTER — Ambulatory Visit (INDEPENDENT_AMBULATORY_CARE_PROVIDER_SITE_OTHER): Payer: Medicare Other

## 2021-01-28 ENCOUNTER — Encounter: Payer: Self-pay | Admitting: Cardiovascular Disease

## 2021-01-28 VITALS — BP 93/63 | HR 78 | Ht 66.0 in | Wt 200.8 lb

## 2021-01-28 DIAGNOSIS — I447 Left bundle-branch block, unspecified: Secondary | ICD-10-CM

## 2021-01-28 DIAGNOSIS — R55 Syncope and collapse: Secondary | ICD-10-CM | POA: Diagnosis not present

## 2021-01-28 DIAGNOSIS — I2510 Atherosclerotic heart disease of native coronary artery without angina pectoris: Secondary | ICD-10-CM | POA: Diagnosis not present

## 2021-01-28 MED ORDER — CLOPIDOGREL BISULFATE 75 MG PO TABS: 75.0000 mg | ORAL_TABLET | Freq: Every day | ORAL | 3 refills | Status: DC

## 2021-01-28 MED ORDER — SACUBITRIL-VALSARTAN 24-26 MG PO TABS: 1.0000 | ORAL_TABLET | Freq: Two times a day (BID) | ORAL | 3 refills | Status: DC

## 2021-01-28 NOTE — Patient Instructions (Signed)
Medication Instructions:  Your physician recommends that you continue on your current medications as directed. Please refer to the Current Medication list given to you today.  *If you need a refill on your cardiac medications before your next appointment, please call your pharmacy*   Lab Work: None ordered   Testing/Procedures: Your physician has recommended that you wear an event monitor. Event monitors are medical devices that record the hearts electrical activity. Doctors most often [place] these monitors to diagnose arrhythmias. Arrhythmias are problems with the speed or rhythm of the heartbeat. The monitor is a small, portable device. You can wear one while you do your normal daily activities. This is usually used to diagnose what is causing palpitations/syncope (passing out).   Your physician has requested that you have a carotid duplex. This test is an ultrasound of the carotid arteries in your neck. It looks at blood flow through these arteries that supply the brain with blood. Allow one hour for this exam. There are no restrictions or special instructions.   Follow-Up: At [facility], you and your health needs are our priority.  As part of our continuing mission to provide you with exceptional heart care, we have created designated Provider Care Teams.  These Care Teams include your primary Cardiologist (physician) and Advanced Practice Providers (APPs -  Physician Assistants and Nurse Practitioners) who all work together to provide you with the care you need, when you need it.    Your next appointment:   4 - 6  week(s)  The format for your next appointment:   In Person  Provider:   Kristeen Miss, MD     Thank you for choosing [facility]!!   [phone]   Other Instructions

## 2021-01-28 NOTE — Progress Notes (Unsigned)
[id number] [employer] 14 day cardiac event monitor from office inventory applied to patient.

## 2021-01-28 NOTE — Progress Notes (Signed)
Cardiology Office Note:    Date:  01/28/2021   ID:  Ian [name], DOB 19-Nov-1945, MRN [id number]  PCP:  Gaspar Garbe, Ian [name]  Cardiologist:  Kristeen Miss, Ian [name]  Electrophysiologist:  None   Referring Ian [name]:  Tisovec   Problem list 1.  Coronary artery disease 2.  Hyperlipidemia 3.  Hypertension 4.  Left bundle branch block 5.  Hyperlipidemia 6. Gastric bypass - ~2005 7  Anemia ( iron def. B12 deficiency )   Chief Complaint  Patient presents with   Loss of Consciousness        Ian [name] is a 76 y.o. male with a hx of   CAD, HTN, LBBB Just moved from [place].  Seen with wife Ian [name].  Ian [name] is seen today for the first time.  He has a history of coronary artery disease.  He has a history of stenting in August, 2019 in [place]. He is felt well following the stents. Recent  ( Sept. 2017, 2019) total cholesterol level is 90.  The HDL is 35.  The LDL is 46.  The triglyceride level is 44.  Retired Presenter, broadcasting / Surveyor, minerals  Works in his [employer] now  No real exercise   OCT. 27, 2020: Ian [name] is seen today for follow-up of his coronary artery disease.  He has a history of hyperlipidemia.  He has a stable LBBB.  Busy in his wood shop Has been diagnosed with essential tremor ( is on Primidone)  We discussed drug interactions with his multiple cardiac meds.  No CP since his stenting several years ago   Jan. 31, 2022: Ian [name] is seen today for follow up of his CAD, HLD. LBBB  Hx of coronary stenting in 2019.  Still doing lots of woodworking .    He told me about a new table saw - the sawstop  Is very active in his shop but no real cardio exercise   June 29, 2020: Ian [name] is seen today for follow up of his CAD and LBBB, HTN Echo on Feb. 18, 2022 shows mildly reduced LV EF (40-45%) and he was started on Entresto 24-26 BID He has mild AS Is losing some weight  Is on Primidone and topamax  Has lost 20 pounds .  Dec. 6, 2022 Ian [name] is seen today for follow up of his CAD, LBBB,  HTN Echo in Feb. 2022 sows EF 40-45%. Was started on Entresto 24-26 BID   He had an episode of orthostatic hypotension while checking her wife's tires .  Had lost 35-40 lbs .  CT of the head was normal,  MRI of brain was normal  Was seen by Dr. Wylene Simmer.  Coreg was reduced to 3.125 BID  Feels better , no further episodes of syncope  He check his bp regularly ,    January 28, 2021: Ian [name] is seen today for a work in visit.  He was doing some woodworking last week and developed an episode of unresponsiveness.  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  His daughter was with him out in the shop and stated that he came to approximately a minute or so later.  Following that he felt fine.  There was no postictal symptoms.  There is no loss of  consciousness.  Has some vague right sided chest pain  - not similar to his angina. Not pleuretic  Has bee present for several weeks   Had a similar episode while putting  air in his wifes tires.  Sound like a TIA  Is on plavix   Past Medical History:  Diagnosis Date   Allergic rhinitis    Cataract    Coronary artery disease    Stent x1 2008   GERD (gastroesophageal reflux disease)    Hyperlipidemia    Hypertension     Past Surgical History:  Procedure Laterality Date   ABDOMINAL HERNIA REPAIR     CARDIAC CATHETERIZATION     stent   CORONARY ANGIOPLASTY WITH STENT PLACEMENT  2008   stent x1   ESOPHAGEAL MANOMETRY N/A 02/06/2016   Procedure: ESOPHAGEAL MANOMETRY (EM);  Surgeon: Ian FrederickSteven Paul Armbruster, Ian [name];  Location: [facility];  Service: Gastroenterology;  Laterality: N/A;   GASTRIC BYPASS  2005   GASTRIC BYPASS  2005   ROTATOR CUFF REPAIR Left 2007   TOTAL KNEE ARTHROPLASTY Bilateral     Current Medications: Current Meds  Medication Sig   atorvastatin (LIPITOR) 40 MG tablet Take 1 tablet (40 mg total) by mouth daily.   carvedilol (COREG) 6.25 MG tablet Take 1 tablet (6.25 mg  total) by mouth 2 (two) times daily with a meal. (Patient taking differently: Take 3.25 mg by mouth 2 (two) times daily with a meal.)   Cholecalciferol (VITAMIN D3) 250 MCG (10000 UT) TABS Take 1,000 Units by mouth in the morning and at bedtime.   clopidogrel (PLAVIX) 75 MG tablet Take 1 tablet (75 mg total) by mouth daily.   CO-ENZYME Q-10 PO Take 200 mg by mouth daily.   ferrous sulfate 325 (65 FE) MG tablet Take 325 mg by mouth 2 (two) times daily with a meal.   gabapentin (NEURONTIN) 300 MG capsule 1 q hs x 2weeks, then 1 po bid   pantoprazole (PROTONIX) 40 MG tablet TAKE 1 TABLET(40 MG) BY MOUTH DAILY.   ranolazine (RANEXA) 500 MG 12 hr tablet Take 1 tablet (500 mg total) by mouth 2 (two) times daily.   sacubitril-valsartan (ENTRESTO) 24-26 MG Take 1 tablet by mouth 2 (two) times daily.   Current Facility-Administered Medications for the 01/28/21 encounter (Office Visit) with Ian [name], Ian PingPhilip [name], Ian [name]  Medication   0.9 %  sodium chloride infusion     Allergies:   Sulfa antibiotics and Tetanus toxoids   Social History   Socioeconomic History   Marital status: Married    Spouse name: Not on file   Number of children: 2   Years of education: Not on file   Highest education level: Not on file  Occupational History   Occupation: brick layer  Tobacco Use   Smoking status: Former    Packs/day: 1.00    Years: 18.00    Pack years: 18.00    Types: Cigarettes    Start date: 02/09/1964    Quit date: 05/23/1982    Years since quitting: 38.7   Smokeless tobacco: Never   Tobacco comments:    Also smoked 4-5 cigars for 10 years after cigarettes  Substance and Sexual Activity   Alcohol use: Yes    Alcohol/week: 0.0 standard drinks    Comment: 1 glass wine a month   Drug use: No   Sexual activity: Not on file  Other Topics Concern   Not on file  Social History Narrative   Originally from [place]. [place] lived in [place]. Previously has traveled to [place], [place], [place], [place], & on  [place]ises. Currently works as a Designer, fashion/clothingmason contractor. No pets currently. Remote bird exposure. No mold or hot tub exposure. No  known asbestos exposure.    Right handed   Lives with wife   Currently retired    International aid/development workerocial Determinants of Corporate investment bankerHealth   Financial Resource Strain: Not on file  Food Insecurity: Not on file  Transportation Needs: Not on file  Physical Activity: Not on file  Stress: Not on file  Social Connections: Not on file     Family History: The patient's family history includes Breast cancer in his mother and sister; Colon cancer (age of onset: 5282) in his father; Diabetes in his mother and sister; Heart disease in his father and mother; Prostate cancer in his father. There is no history of Lung disease or Esophageal cancer.  ROS:   Please see the history of present illness.     All other systems reviewed and are negative.  EKGs/Labs/Other Studies Reviewed:    The following studies were reviewed today:    Recent Labs: 10/10/2020: ALT 37; BUN 17; Creatinine, Ser 1.10; Hemoglobin 14.1; Platelets 213; Potassium 4.2; Sodium 139  Recent Lipid Panel    Component Value Date/Time   CHOL 116 02/06/2020 0835   TRIG 48 02/06/2020 0835   HDL 48 02/06/2020 0835   CHOLHDL 2.4 02/06/2020 0835   LDLCALC 56 02/06/2020 0835     Physical Exam: Blood pressure 93/63, pulse 78, height 5\' 6"  (1.676 m), weight 200 lb 12.8 oz (91.1 kg), SpO2 97 %.   Lying down heart rate is 74  blood pressure 93/63 Sitting heart rate 78   BP 93/66 Standing at (0 )heart rate 78  BP 101/67 Standing(3) heart rate 82  BP 109/73  GEN:  Well nourished, well developed in no acute distress HEENT: Normal NECK: No JVD; No carotid bruits LYMPHATICS: No lymphadenopathy CARDIAC: RRR , no murmurs, rubs, gallops RESPIRATORY:  Clear to auscultation without rales, wheezing or rhonchi  ABDOMEN: Soft, non-tender, non-distended MUSCULOSKELETAL:  No edema; No deformity  SKIN: Warm and dry NEUROLOGIC:  Alert and  oriented x 3   ECG:    January 28, 2021: Normal sinus rhythm at 78.  Left bundle branch block.  Occasional premature ventricular contraction no changes from the previous EKG    ASSESSMENT:    1. Syncope and collapse   2. Coronary artery disease involving native coronary artery of native heart without angina pectoris   3. LBBB (left bundle branch block)       PLAN:       Syncope : Ian [name] present for further evaluation of an episode of syncope.  It occurred 1 morning while he was at woodworking in his shop.  He had had a normal breakfast.  He does not recall any chest pain or shortness of breath prior to the episode.  He had a very brief warning where he did not feel quite right.  The episode lasted for perhaps a minute or so.  He then came to when his daughter was asking him if he was okay.  He felt completely normal within another 1 to 2 minutes.  Is possible that this was an episode of bradycardia.  He did not have syncope.  We do not have any evidence for orthostasis although he had similar episodes when orthostasis was suspected as the cause.  I am concerned that this might be a TIA and think that he should see neurology.  We are placing a 14-day event monitor.  Will check carotid duplex.  Echocardiogram is unchanged from his previous echo.  His LVEF is mildly depressed but is unchanged from his previous  echo.  He has no significant valvular disease.   2.  coronary artery disease:    No angina ,  cont plavix      3.    Hyperlipidemia:  stable,  check labs in 6 months    4.  Chronic systolic congestive heart failure: Kurt has longstanding left bundle branch block.  His last echo showed mildly reduced left ventricular systolic function with an EF of 40 to 45%.  We started him on Entresto 24-26 twice a day.  He was on carvedilol 6.25 mg twice a day.  He had he had an episode of orthostatic hypotension and his primary medical doctor reduced the carvedilol to 3.125 mg twice a day.   So far he seems to be stable.     Anticipate seeing him again in 4-6 weeks  Continue current medications for now.      Medication Adjustments/Labs and Tests Ordered: Current medicines are reviewed at length with the patient today.  Concerns regarding medicines are outlined above.  Orders Placed This Encounter  Procedures   Cardiac event monitor   EKG 12-Lead   VAS US CAROTID     No orders of the defined types were placed in this encounter.     Patient Instructions  Medication Instructions:  Your physician recommends that you continue on your current medications as directed. Please refer to the Current Medication list given to you today.  *If you need a refill on your cardiac medications before your next appointment, please call your pharmacy*   Lab Work: None ordered   Testing/Procedures: Your physician has recommended that you wear an event monitor. Event monitors are medical devices that record the hearts electrical activity. Doctors most often [place] these monitors to diagnose arrhythmias. Arrhythmias are problems with the speed or rhythm of the heartbeat. The monitor is a small, portable device. You can wear one while you do your normal daily activities. This is usually used to diagnose what is causing palpitations/syncope (passing out).   Your physician has requested that you have a carotid duplex. This test is an ultrasound of the carotid arteries in your neck. It looks at blood flow through these arteries that supply the brain with blood. Allow one hour for this exam. There are no restrictions or special instructions.   Follow-Up: At [facility], you and your health needs are our priority.  As part of our continuing mission to provide you with exceptional heart care, we have created designated Provider Care Teams.  These Care Teams include your primary Cardiologist (physician) and Advanced Practice Providers (APPs -  Physician Assistants and Nurse Practitioners) who  all work together to provide you with the care you need, when you need it.    Your next appointment:   4 - 6  week(s)  The format for your next appointment:   In Person  Provider:   Kristeen Miss, Ian [name]     Thank you for choosing [facility]!!   [phone]   Other Instructions       Signed, Kristeen Miss, Ian [name]  01/28/2021 5:37 PM    [facility]

## 2021-02-06 ENCOUNTER — Telehealth: Payer: Self-pay | Admitting: Cardiovascular Disease

## 2021-02-06 ENCOUNTER — Ambulatory Visit (HOSPITAL_COMMUNITY)
Admission: RE | Admit: 2021-02-06 | Discharge: 2021-02-06 | Disposition: A | Discharge status: Home / Self Care | Payer: Medicare Other | Source: Ambulatory Visit | Attending: Cardiovascular Disease | Admitting: Cardiovascular Disease

## 2021-02-06 ENCOUNTER — Other Ambulatory Visit: Payer: Self-pay

## 2021-02-06 DIAGNOSIS — R55 Syncope and collapse: Secondary | ICD-10-CM | POA: Insufficient documentation

## 2021-02-06 NOTE — Telephone Encounter (Signed)
Pt calling to ask about his carotids he will have done at our [facility] office today at 1100.  He is asking if this is invasive and will they be going down his throat during this test.  Informed the pt that a carotid US is a safe, noninvasive, painless test that uses sound waves to examine the blood flow through his carotid arteries.   Informed the pt there is no special instructions or prep work he has to do prior to having this test done.  Advised him to come to his appt as scheduled today at 1100, and arrive 15 mins prior to this appt.  Pt verbalized understanding and agrees with this plan.  Pt was more than gracious for all the assistance provided.

## 2021-02-06 NOTE — Telephone Encounter (Signed)
Patient calling with question/concern for procedure he has today at 66. Please advise

## 2021-02-07 ENCOUNTER — Other Ambulatory Visit: Payer: Self-pay

## 2021-02-07 ENCOUNTER — Telehealth: Payer: Self-pay | Admitting: Neurology

## 2021-02-07 ENCOUNTER — Telehealth: Payer: Self-pay | Admitting: Cardiovascular Disease

## 2021-02-07 DIAGNOSIS — R55 Syncope and collapse: Secondary | ICD-10-CM

## 2021-02-07 NOTE — Telephone Encounter (Signed)
Spoke with pt and advised per Dr Acie Fredrickson  Mild bilateral carotid stenosis. His last lipid levels (Jan. 2022) look good LDL is 56 His mild carotid artery disease is not likely the cause of his recent syncopal episodes    Repeat carotid duplex scan in 12 months   Pt verbalizes understanding and thanked Therapist, sports for the call.

## 2021-02-07 NOTE — Telephone Encounter (Signed)
I got patient sch for the EEG on 02-25-21 and then the follow up with Tat on 02-27-21

## 2021-02-07 NOTE — Telephone Encounter (Signed)
I went on and put the patient in on 02-18-21 so the appt did not get taken. Chelsea will you please let me know when you talk to patient if he would like to do a VV  and if he will be using his smart phone or Email address and I will change the appt to a VV if he would like to do a VV. Please let me know if he wants to move forward with the EEG and I will be happy to sch

## 2021-02-07 NOTE — Telephone Encounter (Signed)
Patient is willing to do EEG and I will put order in unfortunately he has cataract surgery scheduled for Feb 6th so he will need to find another date for a VV he would like to video

## 2021-02-07 NOTE — Telephone Encounter (Signed)
Patient returned call for test results.  °

## 2021-02-07 NOTE — Telephone Encounter (Signed)
Cardiology is working the patient up for near syncope.  Currently wearing a monitor.  Cardiologist did carotid ultrasound and was negative.  Cardiologist contacted me and asked me to work patient up from neurologic standpoint.  I was weaning the patient off of Topamax because of this.  Patient previously told that near syncope was due to dehydration/weight loss from Topamax.  Patient likely just recently off of the Topamax.  Chelsea, will you call the patient and tell him we will start with ordering EEG, although may need prolonged EEG.  Insurance makes a start with short EEG first.  Also, put him in on 8:15 AM on February 6.  This can be a video visit just so that I can get better detail on these events.

## 2021-02-10 DIAGNOSIS — R55 Syncope and collapse: Secondary | ICD-10-CM

## 2021-02-18 ENCOUNTER — Other Ambulatory Visit: Payer: Self-pay | Admitting: Cardiovascular Disease

## 2021-02-18 ENCOUNTER — Ambulatory Visit: Payer: Medicare Other | Admitting: Neurology

## 2021-02-21 ENCOUNTER — Telehealth: Payer: Self-pay | Admitting: *Deleted

## 2021-02-21 NOTE — Telephone Encounter (Signed)
Called then left the following message: There was a cancellation for an EEG on Friday February 10 at 1pm. Your name popped up requesting a sooner appointment. If you would like to come tomorrow at 1 pm call us at [phone] to check to see if still available.

## 2021-02-22 ENCOUNTER — Ambulatory Visit: Payer: Medicare Other | Admitting: Neurology

## 2021-02-22 ENCOUNTER — Other Ambulatory Visit: Payer: Self-pay

## 2021-02-22 DIAGNOSIS — R55 Syncope and collapse: Secondary | ICD-10-CM

## 2021-02-22 NOTE — Procedures (Signed)
TECHNICAL SUMMARY:  A multichannel referential and bipolar montage EEG using the standard international 10-20 system was performed on the patient described as awake, drowsy and asleep.  The dominant background activity consists of 9 to 10 hertz activity seen most prominantly over the posterior head region.  The backgound activity is reactive to eye opening and closing procedures.  Low voltage fast (beta) activity is distributed symmetrically and maximally over the anterior head regions.  ACTIVATION:  Stepwise photic stimulation at 4-20 flashes per second was performed and did not elicit any abnormal waveforms.  Hyperventilation was not performed.  EPILEPTIFORM ACTIVITY:  There were no spikes, sharp waves or paroxysmal activity.  SLEEP: Stage I and stage II sleep architecture was identified.  IMPRESSION:  This is a normal EEG for the patients stated age.  There were no focal, hemispheric or lateralizing features.  No epileptiform activity was recorded.  A normal EEG does not exclude the diagnosis of a seizure disorder and if seizure remains high on the list of differential diagnosis, an ambulatory EEG may be of value.  Clinical correlation is required.

## 2021-02-25 ENCOUNTER — Other Ambulatory Visit: Payer: Medicare Other

## 2021-02-25 NOTE — Progress Notes (Unsigned)
Assessment/Plan:    1.  Essential Tremor  ***He is currently on gabapentin, 300 mg twice per day.  -Not a candidate for primidone (taken off of it in the past because of potential interaction with Ranexa)  -Already on carvedilol (and has had issues with bradycardia so no propranolol)  -No topiramate because of history of nephrolithiasis  2.  Transient alteration of awareness  -This does not sound syncopal  -Cardiology thought maybe this was a TIA, but I really do not think so.  -This could represent a seizure.  His EEG was negative.  His ambulatory EEG is pending.  Cardiology also thought it could represent an episode of bradycardia or even orthostasis.  -discussed MRI brain.  He had one in sept without contrast.  We will go ahead and repeat with gad to make sure nothing there  -Cardiac event monitor was nonrevealing.  -Carotid ultrasound negative.  Subjective:   Ian [name] was seen today in follow up.  I actually saw him fairly recently as a new patient, at which time we weaned him off of the Topamax and started him on gabapentin.  He was worked back in today at the request of his cardiologist due to his history of syncope.  He had mentioned this to me last visit as well.  Stated that he had a history of syncope and was told that it was due to his weight loss and beta-blocker use.  At that point, his previous cardiologist had decreased his carvedilol.  Since our last visit, the patient had another ***episode while working in [employer].  However, this was really an episode of loss of awareness.  He had a 14-day event monitor placed that did not show any significant arrhythmias.  Current prescribed movement disorder medications: ***Gabapentin, 300 mg twice per day   PREVIOUS MEDICATIONS: {Parkinson's RX:18200}Primidone (taken off of because of potential interaction with Ranexa); topiramate (took this off because of history of kidney stones); propranolol not an option because  of bradycardia in the past (he is also on carvedilol)  ALLERGIES:   Allergies  Allergen Reactions   Sulfa Antibiotics Other (See Comments)    As child; unknown   Tetanus Toxoids     The horse serum for tetanus    CURRENT MEDICATIONS:  No outpatient medications have been marked as taking for the 02/27/21 encounter (Appointment) with Keasha Malkiewicz, Octaviano Batty, DO.   Current Facility-Administered Medications for the 02/27/21 encounter (Appointment) with Everline Mahaffy, Octaviano Batty, DO  Medication   0.9 %  sodium chloride infusion      Objective:    PHYSICAL EXAMINATION:    VITALS:  There were no vitals filed for this visit.  GEN:  The patient appears stated age and is in NAD. HEENT:  Normocephalic, atraumatic.  The mucous membranes are moist. The superficial temporal arteries are without ropiness or tenderness. CV:  RRR Lungs:  CTAB Neck/HEME:  There are no carotid bruits bilaterally.  Neurological examination:  Orientation: The patient is alert and oriented x3. Cranial nerves: There is good facial symmetry. The speech is fluent and clear. Soft palate rises symmetrically and there is no tongue deviation. Hearing is intact to conversational tone. Sensation: Sensation is intact to light touch throughout Motor: Strength is at least antigravity x4.  Movement examination: Tone: There is normal tone in the UE/LE Abnormal movements: *** Coordination:  There is *** decremation with RAM's, *** Gait and Station: The patient has *** difficulty arising out of a deep-seated chair without the  use of the hands. The patient's stride length is good I have reviewed and interpreted the following labs independently   Chemistry      Component Value Date/Time   NA 139 10/10/2020 1757   NA 142 06/29/2020 0909   K 4.2 10/10/2020 1757   CL 112 (H) 10/10/2020 1757   CO2 21 (L) 10/10/2020 1757   BUN 17 10/10/2020 1757   BUN 18 06/29/2020 0909   CREATININE 1.10 10/10/2020 1757      Component Value Date/Time    CALCIUM 8.4 (L) 10/10/2020 1757   ALKPHOS 70 10/10/2020 1757   AST 40 10/10/2020 1757   ALT 37 10/10/2020 1757   BILITOT 0.7 10/10/2020 1757   BILITOT 0.5 02/06/2020 0835      Lab Results  Component Value Date   WBC 7.3 10/10/2020   HGB 14.1 10/10/2020   HCT 44.1 10/10/2020   MCV 99.5 10/10/2020   PLT 213 10/10/2020   No results found for: TSH   Chemistry      Component Value Date/Time   NA 139 10/10/2020 1757   NA 142 06/29/2020 0909   K 4.2 10/10/2020 1757   CL 112 (H) 10/10/2020 1757   CO2 21 (L) 10/10/2020 1757   BUN 17 10/10/2020 1757   BUN 18 06/29/2020 0909   CREATININE 1.10 10/10/2020 1757      Component Value Date/Time   CALCIUM 8.4 (L) 10/10/2020 1757   ALKPHOS 70 10/10/2020 1757   AST 40 10/10/2020 1757   ALT 37 10/10/2020 1757   BILITOT 0.7 10/10/2020 1757   BILITOT 0.5 02/06/2020 0835         Total time spent on today's visit was ***30 minutes, including both face-to-face time and nonface-to-face time.  Time included that spent on review of records (prior notes available to me/labs/imaging if pertinent), discussing treatment and goals, answering patient's questions and coordinating care.  Cc:  Tisovec, Adelfa Koh, MD

## 2021-02-26 ENCOUNTER — Other Ambulatory Visit: Payer: Self-pay

## 2021-02-26 ENCOUNTER — Telehealth: Payer: Self-pay | Admitting: Neurology

## 2021-02-26 MED ORDER — CARVEDILOL 6.25 MG PO TABS: 6.2500 mg | ORAL_TABLET | Freq: Two times a day (BID) | ORAL | 3 refills | Status: DC

## 2021-02-26 NOTE — Telephone Encounter (Signed)
Called patient and was unable to reach left voicemail that dr. Arbutus Leas would like the patient to attend his appointment tomorrow

## 2021-02-26 NOTE — Telephone Encounter (Signed)
Patient has an appt with tat 02/27/21, he received his results in my chart. He wants to know if he still needs to come tomorrow. He also wants to know if he needs to keep his other appt in march.   Call his number not wife [phone]

## 2021-02-27 ENCOUNTER — Ambulatory Visit: Payer: Medicare Other | Admitting: Neurology

## 2021-02-27 ENCOUNTER — Encounter: Payer: Self-pay | Admitting: Neurology

## 2021-02-27 ENCOUNTER — Other Ambulatory Visit: Payer: Self-pay

## 2021-02-27 VITALS — BP 115/62 | HR 64 | Ht 65.0 in | Wt 205.6 lb

## 2021-02-27 DIAGNOSIS — G250 Essential tremor: Secondary | ICD-10-CM | POA: Diagnosis not present

## 2021-02-27 DIAGNOSIS — R404 Transient alteration of awareness: Secondary | ICD-10-CM

## 2021-02-27 NOTE — Progress Notes (Signed)
Assessment/Plan:    1.  Essential Tremor  -He is currently on gabapentin, 300 mg twice per day.  -Not a candidate for primidone (taken off of it in the past because of potential interaction with Ranexa)  -Already on carvedilol (and has had issues with bradycardia so no propranolol)  -No topiramate because of history of nephrolithiasis  2.  Transient alteration of awareness  -This does not sound syncopal  -Cardiology thought maybe this was a TIA, but I really do not think so.  For a TIA to affect consciousness, it would have to affect b/l carotids and/or reticular activating system, which would leave significant issues.  -This could represent a seizure.  His EEG was negative.  His ambulatory EEG is pending.  Cardiology also thought it could represent an episode of bradycardia or even orthostasis.  -discussed MRI brain.  He had one in sept without contrast.  We will go ahead and repeat with gad to make sure nothing there  -Cardiac event monitor was nonrevealing.  -Carotid ultrasound negative.   Subjective:   Ian [name] was seen today in follow up. Pt with wife who supplements the history.   I actually saw him fairly recently as a new patient, at which time we weaned him off of the Topamax and started him on gabapentin.  He is tolerating that medication well.  He was worked back in today at the request of his cardiologist due to his history of syncope.  He had mentioned this to me last visit as well.  Stated that he had a history of syncope and was told that it was due to his weight loss and beta-blocker use.  At that point, his previous cardiologist had decreased his carvedilol.  Wife describes previous episodes to me.  She states that it occurred in approximately October.  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  He went to the door to look at the recommended tire pressure, but instead of looking at the inside door, he picked up the hand sanitizer and started  to look confused.  She realized that he was out of it.  She started to ask him what he was doing, but he did not respond.  She asked him to sit several times, but he would not do that.  She called his daughter and subsequently called 911.  The patient stated that he could occasionally hear his wife asking what he was doing, but then does not remember 911 being called.  He does remember EMS tech arriving and telling him to sit down, and he wondered why they were there.  Wife states that the whole episode lasted anywhere from 10 minutes to 12 minutes.  He was able to maintain posture the entire time, without falling.  He was taken to the hospital and evaluated with CT and MRI brain.  I reviewed that again today.  That was all negative.  This is when the cardiologist had decreased his carvedilol.  Since our last visit, the patient had another episode while working in [employer].  His daughter was with him.  He was showing his daughter how to do something and he felt lightheaded, almost like how one would feel when they would smell gasoline.  Was able to maintain standing posture.  He was holding the router in his hand but daughter noticed it was going all over and then realized her father was not aware.  it lasted "a good minute."  He felt completely normal after.  No loss of bladder/bowel control.  No palpitations.  No confusion.   He had a 14-day event monitor placed that did not show any significant arrhythmias.  Current prescribed movement disorder medications: Gabapentin, 300 mg twice per day   PREVIOUS MEDICATIONS: Primidone (taken off of because of potential interaction with Ranexa); topiramate (took this off because of history of kidney stones); propranolol not an option because of bradycardia in the past (he is also on carvedilol)  ALLERGIES:   Allergies  Allergen Reactions   Sulfa Antibiotics Other (See Comments)    As child; unknown   Tetanus Toxoids     The horse serum for tetanus     CURRENT MEDICATIONS:  Current Meds  Medication Sig   atorvastatin (LIPITOR) 40 MG tablet TAKE 1 TABLET BY MOUTH EVERY DAY   carvedilol (COREG) 6.25 MG tablet Take 1 tablet (6.25 mg total) by mouth 2 (two) times daily with a meal. (Patient taking differently: Take 3.125 mg by mouth 2 (two) times daily with a meal.)   Cholecalciferol (VITAMIN D3) 250 MCG (10000 UT) TABS Take 1,000 Units by mouth in the morning and at bedtime.   clopidogrel (PLAVIX) 75 MG tablet Take 1 tablet (75 mg total) by mouth daily.   CO-ENZYME Q-10 PO Take 200 mg by mouth daily.   ferrous sulfate 325 (65 FE) MG tablet Take 325 mg by mouth 2 (two) times daily with a meal.   gabapentin (NEURONTIN) 300 MG capsule 1 q hs x 2weeks, then 1 po bid   pantoprazole (PROTONIX) 40 MG tablet TAKE 1 TABLET(40 MG) BY MOUTH DAILY.   ranolazine (RANEXA) 500 MG 12 hr tablet TAKE 1 TABLET(500 MG) BY MOUTH TWICE DAILY   sacubitril-valsartan (ENTRESTO) 24-26 MG Take 1 tablet by mouth 2 (two) times daily.   Current Facility-Administered Medications for the 02/27/21 encounter (Office Visit) with Kagen Kunath, Octaviano Batty, DO  Medication   0.9 %  sodium chloride infusion      Objective:    PHYSICAL EXAMINATION:    VITALS:   Vitals:   02/27/21 1002  BP: 115/62  Pulse: 64  SpO2: 98%  Weight: 205 lb 9.6 oz (93.3 kg)  Height: 5\' 5"  (1.651 m)    GEN:  The patient appears stated age and is in NAD. HEENT:  Normocephalic, atraumatic.  The mucous membranes are moist. The superficial temporal arteries are without ropiness or tenderness. CV:  RRR Lungs:  CTAB Neck/HEME:  There are no carotid bruits bilaterally.  Neurological examination:  Orientation: The patient is alert and oriented x3. Cranial nerves: There is good facial symmetry. The speech is fluent and clear. Soft palate rises symmetrically and there is no tongue deviation. Hearing is intact to conversational tone. Sensation: Sensation is intact to light touch throughout Motor:  Strength is 5/5 in the bilateral upper and lower extremities.  Movement examination: Tone: There is normal tone in the UE/LE Abnormal movements: There is a slight intermittent rest tremor on the L.  There is tremor in the UE, noted most significantly with action Gait and Station: The patient ambulates well in the hall. I have reviewed and interpreted the following labs independently   Chemistry      Component Value Date/Time   NA 139 10/10/2020 1757   NA 142 06/29/2020 0909   K 4.2 10/10/2020 1757   CL 112 (H) 10/10/2020 1757   CO2 21 (L) 10/10/2020 1757   BUN 17 10/10/2020 1757   BUN 18 06/29/2020 0909  CREATININE 1.10 10/10/2020 1757      Component Value Date/Time   CALCIUM 8.4 (L) 10/10/2020 1757   ALKPHOS 70 10/10/2020 1757   AST 40 10/10/2020 1757   ALT 37 10/10/2020 1757   BILITOT 0.7 10/10/2020 1757   BILITOT 0.5 02/06/2020 0835      Lab Results  Component Value Date   WBC 7.3 10/10/2020   HGB 14.1 10/10/2020   HCT 44.1 10/10/2020   MCV 99.5 10/10/2020   PLT 213 10/10/2020   No results found for: TSH   Chemistry      Component Value Date/Time   NA 139 10/10/2020 1757   NA 142 06/29/2020 0909   K 4.2 10/10/2020 1757   CL 112 (H) 10/10/2020 1757   CO2 21 (L) 10/10/2020 1757   BUN 17 10/10/2020 1757   BUN 18 06/29/2020 0909   CREATININE 1.10 10/10/2020 1757      Component Value Date/Time   CALCIUM 8.4 (L) 10/10/2020 1757   ALKPHOS 70 10/10/2020 1757   AST 40 10/10/2020 1757   ALT 37 10/10/2020 1757   BILITOT 0.7 10/10/2020 1757   BILITOT 0.5 02/06/2020 0835         Total time spent on today's visit was 35 minutes, including both face-to-face time and nonface-to-face time.  Time included that spent on review of records (prior notes available to me/labs/imaging if pertinent), discussing treatment and goals, answering patient's questions and coordinating care.  Cc:  Tisovec, Adelfa Koh, MD

## 2021-02-27 NOTE — Patient Instructions (Signed)
A referral to [facility] has been placed for your MRI someone will contact you directly to schedule your appt. They are located at [address] Please contact them directly by calling [phone] with any questions regarding your referral.  

## 2021-03-11 ENCOUNTER — Ambulatory Visit: Payer: Medicare Other | Admitting: Cardiovascular Disease

## 2021-03-12 ENCOUNTER — Telehealth: Payer: Self-pay | Admitting: Neurology

## 2021-03-12 NOTE — Telephone Encounter (Signed)
Patient called and stated that he has not received a phone call to schedule his MRI and its been a few weeks. He also stated that he has tried to call the number on the AVS and it will not go thru.

## 2021-03-12 NOTE — Telephone Encounter (Signed)
Called Pateint to let him know [facility] has called him at least once to schedule and that is the correct number. I also reached out to [facility] to see if they wouldn't mind calling this patient again

## 2021-03-25 ENCOUNTER — Other Ambulatory Visit: Payer: Self-pay

## 2021-03-25 ENCOUNTER — Ambulatory Visit: Payer: Medicare Other | Admitting: Neurology

## 2021-03-25 DIAGNOSIS — R55 Syncope and collapse: Secondary | ICD-10-CM | POA: Diagnosis not present

## 2021-03-28 ENCOUNTER — Ambulatory Visit
Admission: RE | Admit: 2021-03-28 | Discharge: 2021-03-28 | Disposition: A | Discharge status: Home / Self Care | Payer: Medicare Other | Source: Ambulatory Visit | Attending: Neurology | Admitting: Neurology

## 2021-03-28 IMAGING — MR MR HEAD WO/W CM
11 series · 48 of 48 positions shown · IV contrast (20ml Multihance)
Comparison: [DATE] MRI head and CT head

CLINICAL DATA: Transient alteration of awareness

EXAM:
MRI HEAD WITHOUT AND WITH CONTRAST
TECHNIQUE: Multiplanar, multiecho pulse sequences of the brain and surrounding
structures were obtained without and with intravenous contrast.
CONTRAST:  20mL MULTIHANCE GADOBENATE DIMEGLUMINE 529 MG/ML IV SOLN

[Series 2: T1 · sagittal · 5.0mm · 0.45mm/px · 1 of 24 slices shown]
[im 1/24]
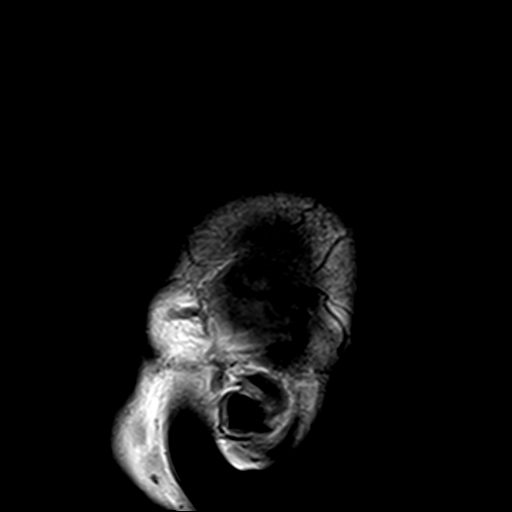

[Series 3: DWI · axial · 3.0mm · 1.80mm/px · z∈[-89,+56]mm · 7 of 100 slices shown (1 of 4)]
[im 1/100]
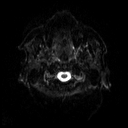
[im 17/100]
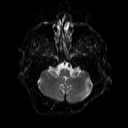
[im 34/100]
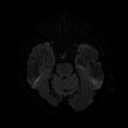
[im 50/100]
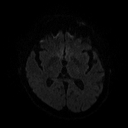
[im 67/100]
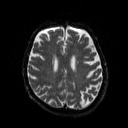
[im 83/100]
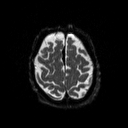
[im 100/100]
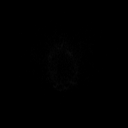

[Series 4: DWI · axial · 3.0mm · 1.80mm/px · z∈[-89,+56]mm · 4 of 49 slices shown (2 of 4)]
[im 1/49]
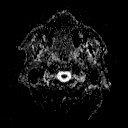
[im 17/49]
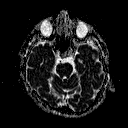
[im 33/49]
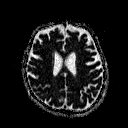
[im 49/49]
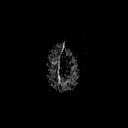

[Series 5: DWI · coronal · 5.0mm · 1.80mm/px · 5 of 69 slices shown (3 of 4)]
[im 1/69]
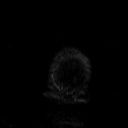
[im 18/69]
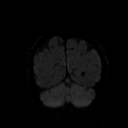
[im 35/69]
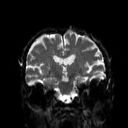
[im 52/69]
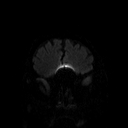
[im 69/69]
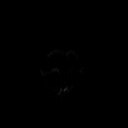

[Series 6: DWI · coronal · 5.0mm · 1.80mm/px · 3 of 35 slices shown (4 of 4)]
[im 1/35]
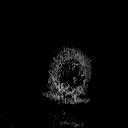
[im 18/35]
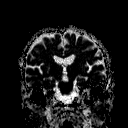
[im 35/35]
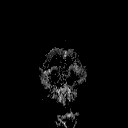

[Series 7: T2 · axial · 5.0mm · 0.60mm/px · z∈[-84,+55]mm · 2 of 22 slices shown (1 of 2)]
[im 1/22]
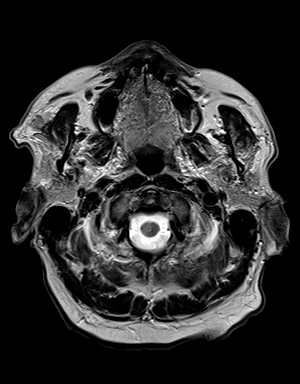
[im 22/22]
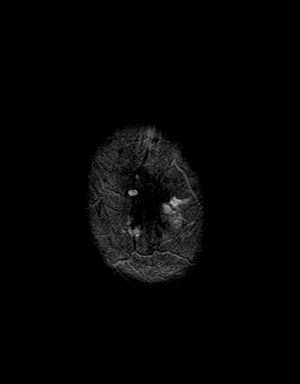

[Series 8: FLAIR · axial · 3.0mm · 0.45mm/px · z∈[-83,+50]mm · 2 of 30 slices shown]
[im 1/30]
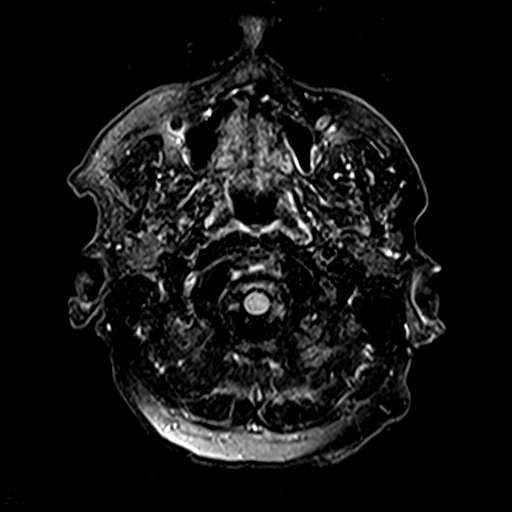
[im 30/30]
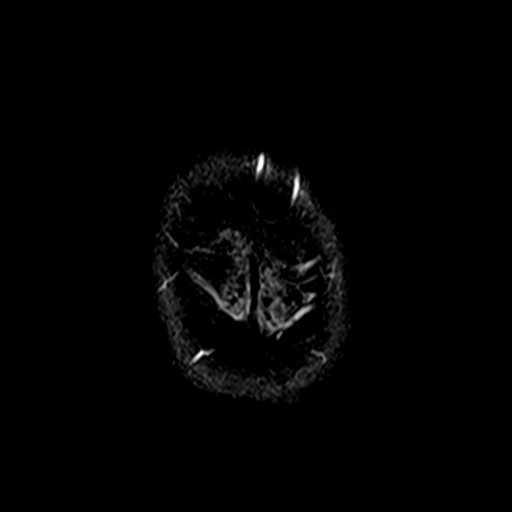

[Series 11: t1_mpr_tra · axial · 1.0mm · 0.75mm/px · z∈[-74,+66]mm · 10 of 144 slices shown]
[im 1/144]
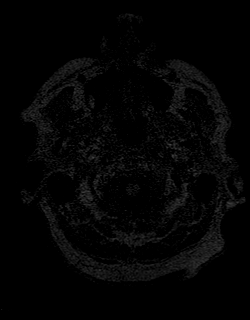
[im 16/144]
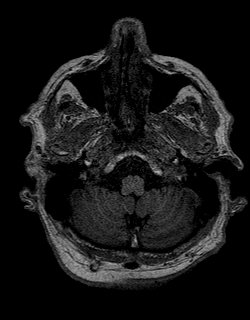
[im 32/144]
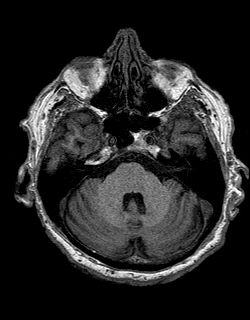
[im 48/144]
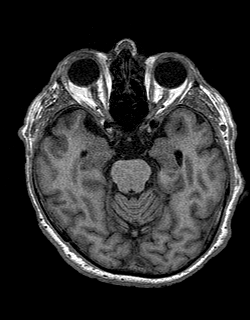
[im 64/144]
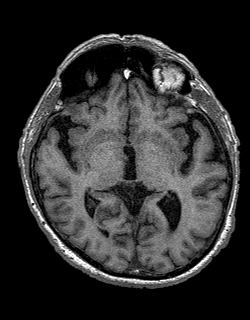
[im 80/144]
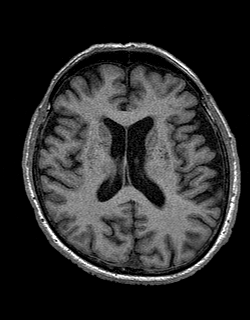
[im 96/144]
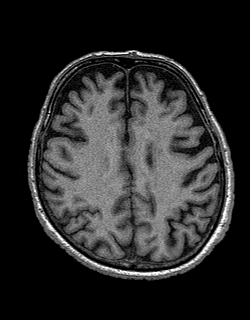
[im 112/144]
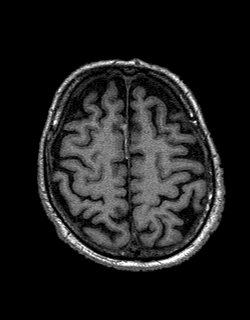
[im 128/144]
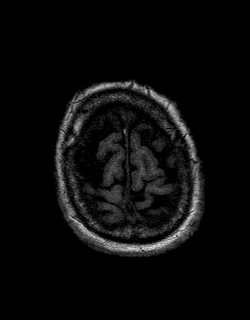
[im 144/144]
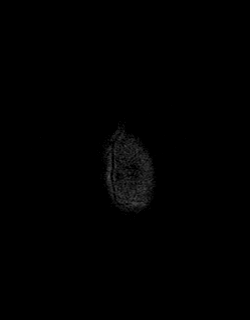

[Series 12: T2 · coronal · 5.0mm · 0.45mm/px · 2 of 27 slices shown (2 of 2)]
[im 1/27]
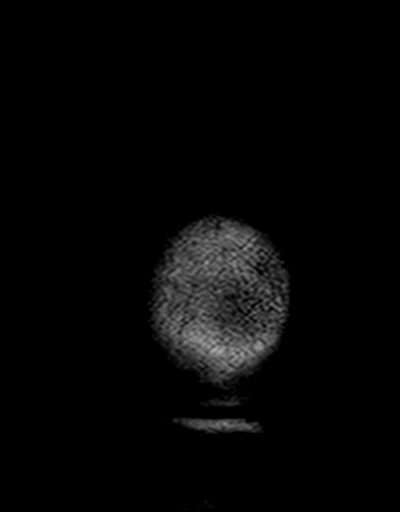
[im 27/27]
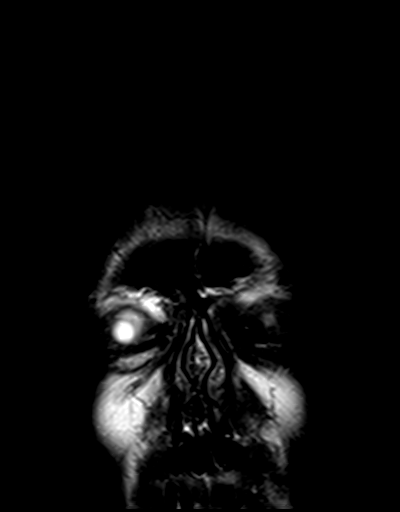

[Series 13: t1_mpr_tra post · axial · 1.0mm · 0.75mm/px · z∈[-74,+66]mm · 10 of 144 slices shown]
[im 1/144]
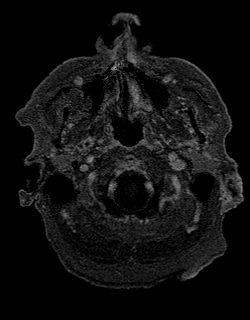
[im 16/144]
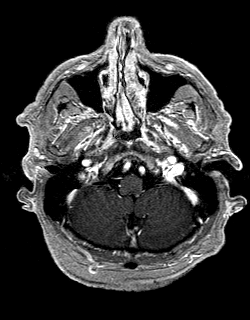
[im 32/144]
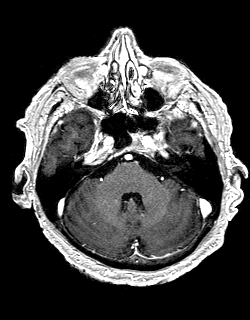
[im 48/144]
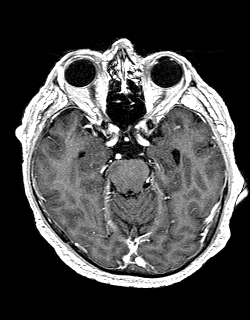
[im 64/144]
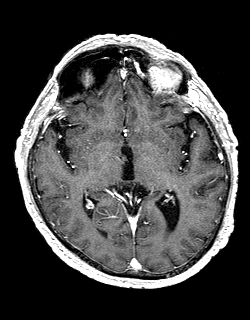
[im 80/144]
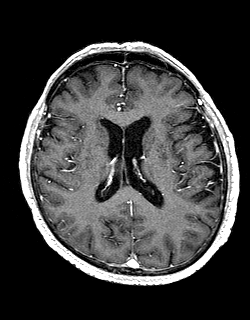
[im 96/144]
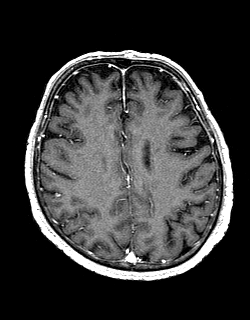
[im 112/144]
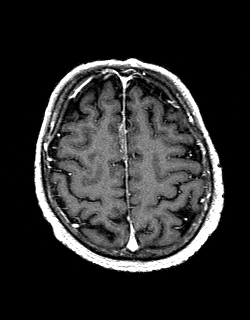
[im 128/144]
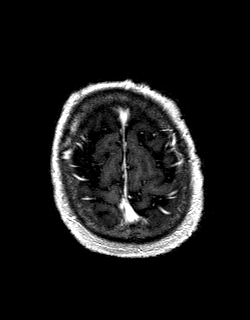
[im 144/144]
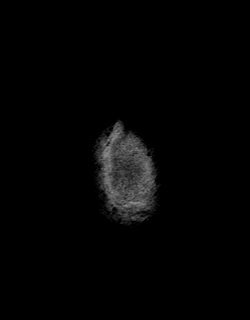

[Series 14: post cor · coronal · 5.0mm · 0.45mm/px · 2 of 27 slices shown]
[im 1/27]
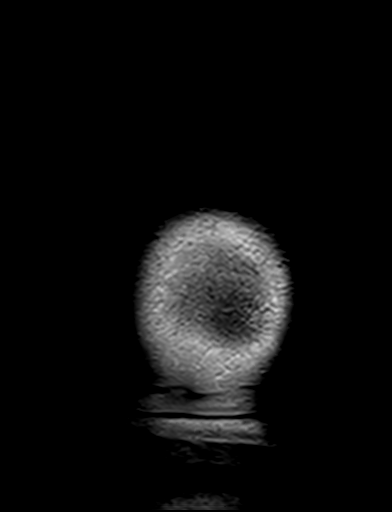
[im 27/27]
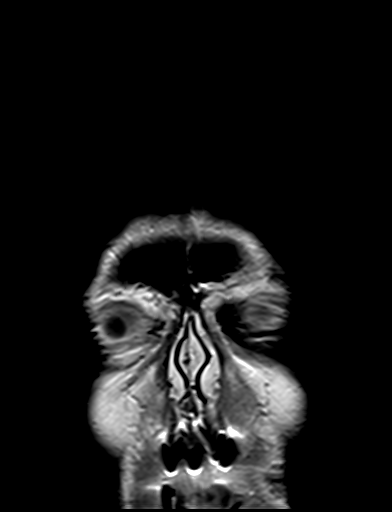

[48 of 48 positions shown; findings below may reference images not displayed]

FINDINGS: Brain: No restricted diffusion to suggest acute or subacute infarct.
No acute hemorrhage, mass, mass effect, or midline shift. No
hydrocephalus or extra-axial collection. No abnormal parenchymal or
meningeal enhancement. Calcifications along falx. Scattered T2
hyperintense signal in the periventricular white matter, likely the
sequela of mild chronic small vessel ischemic disease.

Vascular: Normal flow voids. Postcontrast imaging demonstrates
patent venous sinuses.

Skull and upper cervical spine: Normal marrow signal.

Sinuses/Orbits: Mucous retention cyst in the right maxillary sinus.
Mild mucosal thickening in the anterior ethmoid air cells and
inferior left frontal sinus. Status post bilateral lens
replacements.

Other: None.
IMPRESSION: No acute intracranial process. No etiology is seen for the patient's
episodes.

## 2021-03-28 MED ORDER — DIPHENHYDRAMINE HCL 50 MG/ML IJ SOLN
25.0000 mg | Freq: Once | INTRAMUSCULAR | Status: AC
  Administered 2021-03-28: 25 mg via INTRAVENOUS

## 2021-03-28 MED ORDER — GADOBENATE DIMEGLUMINE 529 MG/ML IV SOLN
18.0000 mL | Freq: Once | INTRAVENOUS | Status: AC | PRN
  Administered 2021-03-28: 20 mL via INTRAVENOUS

## 2021-03-28 NOTE — Progress Notes (Signed)
Nursing called to MRI for contrast reaction, MD Derry to MRI to evaluate pt. 1 single hive noted to left shoulder. Pt brought to Nursing station. Vital signs stable, no additional hives upon visual inspection. No difficulty breathing. 25mg  IV Benadryl given per MD Derry. MRI Contrast Allergy added to chart.  ? ?Will monitor until discharged by Radiologist.  ?

## 2021-04-03 NOTE — Procedures (Signed)
ELECTROENCEPHALOGRAM REPORT ? ?Dates of Recording: 03/25/2021 7:59AM to 03/27/2021 8:08AM ? ?Patient's Name: Ian [name] ?MRN: [id number] ?Date of Birth: March 05, 1945 ? ?Referring Provider: Dr. Lurena Joiner Tat ? ?Procedure: 48-hour ambulatory video EEG ? ?History: This is a 76 year old man with episodes of unresponsiveness. EEG for classification. ? ?Medications:  ?Primidone ?Gabapentin ?Atorvastatin ?Carvedilol ?Ranexa ?Pantoprazole ? ?Technical Summary: ?This is a 48-hour multichannel digital video EEG recording measured by the international 10-20 system with electrodes applied with paste and impedances below 5000 ohms performed as portable with EKG monitoring.  The digital EEG was referentially recorded, reformatted, and digitally filtered in a variety of bipolar and referential montages for optimal display.   ? ?DESCRIPTION OF RECORDING: ?During maximal wakefulness, the background activity consisted of a symmetric 9 Hz posterior dominant rhythm which was reactive to eye opening.  There was occasional focal 2-3 Hz delta slowing over the left temporal region. There were no epileptiform discharges seen in wakefulness. ? ?During the recording, the patient progresses through wakefulness, drowsiness, and Stage 2 sleep.  Again, there were no epileptiform discharges seen in sleep. ? ?Events: ?On 3/13 at 2053 hours, he had a funny feeling over the left eye. Patient not on video. Electrographically, there were no EEG or EKG changes seen. ? ?There were no electrographic seizures seen.  EKG lead was unremarkable. ? ?IMPRESSION: This 48-hour ambulatory video EEG study is abnormal due to occasional focal delta slowing over the left temporal region. ? ?CLINICAL CORRELATION of the above findings indicates focal cerebral dysfunction over the left temporal region suggestive of underlying structural or physiologic abnormality. The absence of epileptiform discharges does not exclude a clinical diagnosis of epilepsy. Typical events were not  captured.  If further clinical questions remain, inpatient video EEG monitoring may be helpful. ? ? ?Patrcia Dolly, M.D. ? ?

## 2021-04-04 ENCOUNTER — Telehealth: Payer: Self-pay | Admitting: Neurology

## 2021-04-04 NOTE — Telephone Encounter (Signed)
Called patient back and he wanted to talk about the structure mentioned in the EEG and to see if the frontal temporal lobe that involves language could be a factor in the episodes that the patient had. Patient tremors worse asking about Topamax or you D/C primidone because of heart medicine anything that could help him with tremor  ?

## 2021-04-04 NOTE — Telephone Encounter (Signed)
Patient called and stated he got the results of his EEG, he has some questions. ?

## 2021-04-05 NOTE — Telephone Encounter (Signed)
Chelsea, I have gone back over the EEG and b/c he/daughter had questions and b/c of the events present, lets schedule a video visit to discuss further.  The brain structure as we discussed was normal and no evidence of abnormal epileptiform activity but we could consider further monitoring.  Please schedule video visit. ?

## 2021-04-05 NOTE — Telephone Encounter (Signed)
Called patient he is aware that we want to do a virtual phone call  ?

## 2021-04-05 NOTE — Progress Notes (Signed)
? ?Virtual Visit Via Video  ? ?The purpose of this virtual visit is to provide medical care while limiting exposure to the novel coronavirus.   ? ?Consent was obtained for video visit:  Yes.   ?Answered questions that patient had about telehealth interaction:  Yes.   ?I discussed the limitations, risks, security and privacy concerns of performing an evaluation and management service by telemedicine. I also discussed with the patient that there may be a patient responsible charge related to this service. The patient expressed understanding and agreed to proceed. ? ?Pt location: Home ?Physician Location: office ?Name of referring provider:  Tisovec, Adelfa Koh, MD ?I connected with [name] at patients initiation/request on 04/09/2021 at  9:15 AM EDT by video enabled telemedicine application and verified that I am speaking with the correct person using two identifiers. ?Pt MRN:  [id number] ?Pt DOB:  15-Mar-1945 ?Video Participants:  Margarito Dehaas; wife supplements history. ? ?Assessment/Plan:  ? ? ?1.  Essential Tremor ? -He is currently on gabapentin, 300 mg twice per day.  They asked about other options.  Discussed with them in a very frank discussion that I do not think he really has any other good options.  Discussed where he has been and discussed that other options are fairly anticholinergic and the risks that does pose.  They asked about increasing the gabapentin, but I really did not want to do that today with adding other medicines. ? -Not a candidate for primidone (taken off of it in the past because of potential interaction with Ranexa) ? -Already on carvedilol (and has had issues with bradycardia so no propranolol) ? -No topiramate because of history of nephrolithiasis.  He wanted to add that back and I told him that I would just was not willing with his history of nephrolithiasis. ? ?2.  Transient alteration of awareness ? -events that have been described do sound suspicious for potential  epileptogenicity. ? - His routine EEG was negative.  His ambulatory EEG did not demonstrate epileptiform activity.  There was, however, occasional focal slowing over the left temporal region.  MRI did not demonstrate any structural abnormality in this region.  Discussed with patient utility of inpatient video EEG monitoring versus just treating and seeing how he did.  They initially did not want to do either of these, but I told them given the strong clinical suspicion based on his description of the events, I thought it would be wise to start medication.  They were agreeable.  We discussed Lamictal along with risks and benefits.  We will just start low-dose of 50 mg once per day for 2 weeks and then increase to 50 mg twice per day.  They will call me should they have side effects.  They will stop the medication immediately with any rash. ? -They did ask me if I thought that this could be due to subclavian steal syndrome.  I told them that I thought this was low likelihood.  Patient has been able to maintain posture with transient alteration of awareness during the events. ? ? ?Subjective:  ? ?Ryder Man was seen today in follow up to discuss testing results in more detail.  Patient had MRI brain done since our last visit with him without gadolinium.  This demonstrated a scattered number of T2 hyperintensities, but was otherwise unremarkable.  Patient had a routine EEG done that was unremarkable.  He had a 48-hour ambulatory EEG that demonstrated occasional focal delta slowing over the left temporal region.  There was no epileptiform activity associated with this.  Patient had no clinical spells during the EEG. ? ?Current prescribed movement disorder medications: ?Gabapentin, 300 mg twice per day ? ? ?PREVIOUS MEDICATIONS: Primidone (taken off of because of potential interaction with Ranexa); topiramate (took this off because of history of kidney stones); propranolol not an option because of bradycardia in the past  (he is also on carvedilol) ? ?ALLERGIES:   ?Allergies  ?Allergen Reactions  ? Sulfa Antibiotics Other (See Comments)  ?  As child; unknown  ? Multihance [Gadobenate] Hives  ?  1 singular hive post contrast   ? Tetanus Toxoids   ?  The horse serum for tetanus  ? ? ?CURRENT MEDICATIONS:  ?No outpatient medications have been marked as taking for the 04/09/21 encounter (Appointment) with Kahdijah Errickson, Octaviano Batty, DO.  ? ?Current Facility-Administered Medications for the 04/09/21 encounter (Appointment) with Kaipo Ardis, Octaviano Batty, DO  ?Medication  ? 0.9 %  sodium chloride infusion  ? ? ? ? ?Objective:  ? ? ?PHYSICAL EXAMINATION:   ? ?VITALS:   ?There were no vitals filed for this visit. ? ?Patient is alert and oriented x3.  Speech is fluent and clear. ? ?I have reviewed and interpreted the following labs independently ?  Chemistry   ?   ?Component Value Date/Time  ? NA 139 10/10/2020 1757  ? NA 142 06/29/2020 0909  ? K 4.2 10/10/2020 1757  ? CL 112 (H) 10/10/2020 1757  ? CO2 21 (L) 10/10/2020 1757  ? BUN 17 10/10/2020 1757  ? BUN 18 06/29/2020 0909  ? CREATININE 1.10 10/10/2020 1757  ?    ?Component Value Date/Time  ? CALCIUM 8.4 (L) 10/10/2020 1757  ? ALKPHOS 70 10/10/2020 1757  ? AST 40 10/10/2020 1757  ? ALT 37 10/10/2020 1757  ? BILITOT 0.7 10/10/2020 1757  ? BILITOT 0.5 02/06/2020 0835  ?  ? ? ?Lab Results  ?Component Value Date  ? WBC 7.3 10/10/2020  ? HGB 14.1 10/10/2020  ? HCT 44.1 10/10/2020  ? MCV 99.5 10/10/2020  ? PLT 213 10/10/2020  ? ?No results found for: TSH ?  Chemistry   ?   ?Component Value Date/Time  ? NA 139 10/10/2020 1757  ? NA 142 06/29/2020 0909  ? K 4.2 10/10/2020 1757  ? CL 112 (H) 10/10/2020 1757  ? CO2 21 (L) 10/10/2020 1757  ? BUN 17 10/10/2020 1757  ? BUN 18 06/29/2020 0909  ? CREATININE 1.10 10/10/2020 1757  ?    ?Component Value Date/Time  ? CALCIUM 8.4 (L) 10/10/2020 1757  ? ALKPHOS 70 10/10/2020 1757  ? AST 40 10/10/2020 1757  ? ALT 37 10/10/2020 1757  ? BILITOT 0.7 10/10/2020 1757  ? BILITOT 0.5  02/06/2020 0835  ?  ? ? ? ? ?Follow up Instructions  ? ?  ? -I discussed the assessment and treatment plan with the patient. The patient was provided an opportunity to ask questions and all were answered. The patient agreed with the plan and demonstrated an understanding of the instructions. ?  ?The patient was advised to call back or seek an in-person evaluation if the symptoms worsen or if the condition fails to improve as anticipated. ? ? ? ?Total time spent on today's visit was , including both face-to-face time and nonface-to-face time.  Time included that spent on review of records (prior notes available to me/labs/imaging if pertinent), discussing treatment and goals, answering patient's questions and coordinating care. ? ? ?Kerin Salen, DO ? ? ?Cc:  Tisovec, Adelfa Kohichard W, MD ? ?

## 2021-04-05 NOTE — Telephone Encounter (Signed)
Called pateint to make him aware that Dr. Arbutus Leas does want to schedule a virtual appt to go over some of his questions ?

## 2021-04-05 NOTE — Telephone Encounter (Signed)
Patient returned a phone call.  

## 2021-04-08 ENCOUNTER — Other Ambulatory Visit: Payer: Medicare Other

## 2021-04-09 ENCOUNTER — Other Ambulatory Visit: Payer: Self-pay

## 2021-04-09 ENCOUNTER — Telehealth (INDEPENDENT_AMBULATORY_CARE_PROVIDER_SITE_OTHER): Payer: Medicare Other | Admitting: Neurology

## 2021-04-09 VITALS — Ht 66.0 in | Wt 200.0 lb

## 2021-04-09 DIAGNOSIS — G250 Essential tremor: Secondary | ICD-10-CM

## 2021-04-09 DIAGNOSIS — R404 Transient alteration of awareness: Secondary | ICD-10-CM

## 2021-04-09 DIAGNOSIS — R9401 Abnormal electroencephalogram [EEG]: Secondary | ICD-10-CM | POA: Diagnosis not present

## 2021-04-09 MED ORDER — LAMOTRIGINE 25 MG PO TABS: ORAL_TABLET | ORAL | 1 refills | Status: DC

## 2021-05-06 ENCOUNTER — Telehealth: Payer: Self-pay | Admitting: Cardiovascular Disease

## 2021-05-06 NOTE — Progress Notes (Signed)
? ? ?Assessment/Plan:  ? ? ?1.  Essential Tremor ? -He is currently on gabapentin, 300 mg twice per day.  He wants to d/c that.  Will decrease to 1 time per day for a week and then d/c that. ? -Patient's cardiologist willing to discontinue the Ranexa in order for patient to get back on the primidone. ? -restart primidone once off of ranexa and work to 50 mg bid.  R/b/se discussed ? -Already on carvedilol (and has had issues with bradycardia so no propranolol) ? -No topiramate because of history of nephrolithiasis and re-iterated this today ? ?2.  Transient alteration of awareness ?            -events that have been described do sound suspicious for potential epileptogenicity. ?            - His routine EEG was negative.  His ambulatory EEG did not demonstrate epileptiform activity.  There was, however, occasional focal slowing over the left temporal region.  MRI did not demonstrate any structural abnormality in this region.  Discussed with patient utility of inpatient video EEG monitoring versus just treating and seeing how he did.  They initially did not want to do either of these, but I told them given the strong clinical suspicion based on his description of the events, I thought it would be wise to start medication.  They were agreeable at the time but he never started the medication and declines today.  Understands that I disagree with this decision and I think he is high risk for seizure.  He will be on primidone but not high enough dose for antiseizure protection.   states that PCP told him that these were not seizure. ? ? ?Subjective:  ? ?Ian [name] was seen today in follow up at the request of cardiology.  Patient just had a phone encounter with cardiologist yesterday.  Cardiologist sent that phone encounter to me.  Cardiologist noted that he would take the patient off of Ranexa if I could put him back on the primidone.  Last visit, I started him on lamictal.  He admits that he didn't take it.  States that  his pcp told him seizure wasn't a possiblity because he didn't fall.   ? ?Current prescribed movement disorder medications: ?Gabapentin, 300 mg twice per day ? ? ?PREVIOUS MEDICATIONS: Primidone (taken off of because of potential interaction with Ranexa); topiramate (took this off because of history of kidney stones); propranolol not an option because of bradycardia in the past (he is also on carvedilol) ? ?ALLERGIES:   ?Allergies  ?Allergen Reactions  ? Sulfa Antibiotics Other (See Comments)  ?  As child; unknown  ? Multihance [Gadobenate] Hives  ?  1 singular hive post contrast   ? Tetanus Toxoids   ?  The horse serum for tetanus  ? ? ?CURRENT MEDICATIONS:  ?Current Meds  ?Medication Sig  ? atorvastatin (LIPITOR) 40 MG tablet TAKE 1 TABLET BY MOUTH EVERY DAY  ? carvedilol (COREG) 6.25 MG tablet Take 1 tablet (6.25 mg total) by mouth 2 (two) times daily with a meal. (Patient taking differently: Take 3.125 mg by mouth 2 (two) times daily with a meal.)  ? Cholecalciferol (VITAMIN D3) 250 MCG (10000 UT) TABS Take 1,000 Units by mouth in the morning and at bedtime.  ? clopidogrel (PLAVIX) 75 MG tablet Take 1 tablet (75 mg total) by mouth daily.  ? CO-ENZYME Q-10 PO Take 200 mg by mouth daily.  ? ferrous sulfate 325 (65  FE) MG tablet Take 325 mg by mouth 2 (two) times daily with a meal.  ? gabapentin (NEURONTIN) 300 MG capsule 1 q hs x 2weeks, then 1 po bid  ? lamoTRIgine (LAMICTAL) 25 MG tablet 2 po q AM x 2 weeks, then 2 po bid  ? pantoprazole (PROTONIX) 40 MG tablet TAKE 1 TABLET(40 MG) BY MOUTH DAILY.  ? ranolazine (RANEXA) 500 MG 12 hr tablet TAKE 1 TABLET(500 MG) BY MOUTH TWICE DAILY  ? sacubitril-valsartan (ENTRESTO) 24-26 MG Take 1 tablet by mouth 2 (two) times daily.  ? ?Current Facility-Administered Medications for the 05/07/21 encounter (Office Visit) with Analeese Andreatta, Eustace Quail, DO  ?Medication  ? 0.9 %  sodium chloride infusion  ? ? ? ? ?Objective:  ? ? ?PHYSICAL EXAMINATION:   ? ?VITALS:   ?Vitals:  ? 05/07/21 0815   ?BP: 110/62  ?Pulse: 62  ?SpO2: 97%  ?Weight: 216 lb 6.4 oz (98.2 kg)  ?Height: 5\' 5"  (1.651 m)  ? ? ? ?GEN:  The patient appears stated age and is in NAD. ?HEENT:  Normocephalic, atraumatic.  The mucous membranes are moist. The superficial temporal arteries are without ropiness or tenderness. ? ? ?Neurological examination: ? ?Orientation: The patient is alert and oriented x3. ?Cranial nerves: There is good facial symmetry. The speech is fluent and clear. Soft palate rises symmetrically and there is no tongue deviation. Hearing is intact to conversational tone. ?Sensation: Sensation is intact to light touch throughout ?Motor: Strength is 5/5 in the bilateral upper and lower extremities. ? ?Movement examination: ?Tone: There is normal tone in the UE/LE ?Abnormal movements: There is no rest tremor.  No postural tremor.  Has min tremor with arch spirals on the right but severe on the L ?Gait and Station: The patient ambulates well in the hall. ?I have reviewed and interpreted the following labs independently ?  Chemistry   ?   ?Component Value Date/Time  ? NA 139 10/10/2020 1757  ? NA 142 06/29/2020 0909  ? K 4.2 10/10/2020 1757  ? CL 112 (H) 10/10/2020 1757  ? CO2 21 (L) 10/10/2020 1757  ? BUN 17 10/10/2020 1757  ? BUN 18 06/29/2020 0909  ? CREATININE 1.10 10/10/2020 1757  ?    ?Component Value Date/Time  ? CALCIUM 8.4 (L) 10/10/2020 1757  ? ALKPHOS 70 10/10/2020 1757  ? AST 40 10/10/2020 1757  ? ALT 37 10/10/2020 1757  ? BILITOT 0.7 10/10/2020 1757  ? BILITOT 0.5 02/06/2020 0835  ?  ? ? ?Lab Results  ?Component Value Date  ? WBC 7.3 10/10/2020  ? HGB 14.1 10/10/2020  ? HCT 44.1 10/10/2020  ? MCV 99.5 10/10/2020  ? PLT 213 10/10/2020  ? ?No results found for: TSH ?  Chemistry   ?   ?Component Value Date/Time  ? NA 139 10/10/2020 1757  ? NA 142 06/29/2020 0909  ? K 4.2 10/10/2020 1757  ? CL 112 (H) 10/10/2020 1757  ? CO2 21 (L) 10/10/2020 1757  ? BUN 17 10/10/2020 1757  ? BUN 18 06/29/2020 0909  ? CREATININE 1.10  10/10/2020 1757  ?    ?Component Value Date/Time  ? CALCIUM 8.4 (L) 10/10/2020 1757  ? ALKPHOS 70 10/10/2020 1757  ? AST 40 10/10/2020 1757  ? ALT 37 10/10/2020 1757  ? BILITOT 0.7 10/10/2020 1757  ? BILITOT 0.5 02/06/2020 0835  ?  ? ? ? ? ? ?Total time spent on today's visit was 31 minutes, including both face-to-face time and nonface-to-face time.  Time  included that spent on review of records (prior notes available to me/labs/imaging if pertinent), discussing treatment and goals, answering patient's questions and coordinating care. ? ?Cc:  Tisovec, Fransico Him, MD ? ?

## 2021-05-06 NOTE — Telephone Encounter (Signed)
Ian [name] was talking with me recently about his hand tremor.  His tremor has worsened significantly over the past several months. ?He was previously on Topamax and Primidone but these have been discontinued. 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 ?From a cardiac standpoint, we can DC the Ranexa ( which would allow him to take the [facility]) ? ?In addition, he tolerated the topamax in the past without having significant issues with renal stones ? ?If Dr. Carles Collet thinks is tremors would improved on Primidone and topamax, we will DC the Ranexa and it might be worth a try to see if his hand tremor improves. ? ?Ian Rogers, MD  ? ?

## 2021-05-07 ENCOUNTER — Encounter: Payer: Self-pay | Admitting: Neurology

## 2021-05-07 ENCOUNTER — Ambulatory Visit: Payer: Medicare Other | Admitting: Neurology

## 2021-05-07 VITALS — BP 110/62 | HR 62 | Ht 65.0 in | Wt 216.4 lb

## 2021-05-07 DIAGNOSIS — R9401 Abnormal electroencephalogram [EEG]: Secondary | ICD-10-CM | POA: Diagnosis not present

## 2021-05-07 DIAGNOSIS — G250 Essential tremor: Secondary | ICD-10-CM | POA: Diagnosis not present

## 2021-05-07 MED ORDER — PRIMIDONE 50 MG PO TABS: 50.0000 mg | ORAL_TABLET | Freq: Two times a day (BID) | ORAL | 1 refills | Status: AC

## 2021-05-07 NOTE — Patient Instructions (Addendum)
Confirm with your cardiologist that it is okay to stop the ranexa ?Once off the ranexa, you may start primidone, 50 mg,1/2 tab at bed for a week, then 1 tablet at bed for a week and then increase to 1 tablet twice per day ?Decrease gabapentin to 300 mg once per day for a week and then stop the gabapentin ?I would recommend an anti seizure medication separate from the primidone.  As of now you have decided to hold off on that. ?

## 2021-05-09 ENCOUNTER — Telehealth: Payer: Self-pay

## 2021-05-09 NOTE — Telephone Encounter (Signed)
-----   Message from Vesta Mixer, MD sent at 05/08/2021  9:28 PM EDT ----- ?Please DC Ranexa ?He is not having any angina and needs to start Primidone for his hand tremor ( Ranexa and Primidone are not compatable )  ? ?Thanks ? ?Pn ? ?----- Message ----- ?From: Ian Faster, DO ?Sent: 05/07/2021   8:48 AM EDT ?To: Vesta Mixer, MD ? ?fyi ? ?

## 2021-05-09 NOTE — Telephone Encounter (Signed)
MAR updated at this time. Called pt who understands to stop taking Ranexa d/t. Starting Primidone. ?

## 2021-05-30 ENCOUNTER — Encounter: Payer: Self-pay | Admitting: Physician Assistant

## 2021-05-30 ENCOUNTER — Telehealth: Payer: Self-pay | Admitting: Gastroenterology

## 2021-05-30 NOTE — Telephone Encounter (Signed)
Next available appt with APP on 06/21/21. He can be scheduled with Dr. Adela Lank on 06/05/21 at 4 pm. No sooner appt available. Thanks

## 2021-05-30 NOTE — Telephone Encounter (Signed)
Emergency referral in [place] for GERD. abnormal appearance of the esophagus reflecting markedly abnormal dilatation versus colonic postsurgical changes.  Spoke with patient.  He is requesting sooner appt than June.  He also stated "I don't care to see Dr. Adela Lank, does he have a PA or something I can see"  Please advise scheduling sooner appt?

## 2021-06-06 ENCOUNTER — Telehealth: Payer: Self-pay | Admitting: Neurology

## 2021-06-06 NOTE — Telephone Encounter (Signed)
Tried calling patient. No answer. LMOVM to call back.  Per Dr.Tat,No.  I'm not willing to do that.  If he is on primidone, 50 mg, 1 po bid, he can increase to primidone, 50 mg, 2 in the AM, 1 at night.  Change in med list and/or send new RX if needed

## 2021-06-06 NOTE — Telephone Encounter (Signed)
Pt called in and left a message. He wants to speak with someone about his shaking. He would like to see about getting back on one of the medications he used to be on.

## 2021-06-06 NOTE — Telephone Encounter (Signed)
Called patient and left a message for a call back.  

## 2021-06-06 NOTE — Telephone Encounter (Signed)
Telephone call to patient, Patient wanted to see if he could restart Topamax to add to his Primidone 50 Mg BID to help with his tremor.  Please advise. Patient is aware and remember his Conversation With Dr.Tat in regard to the his history of Nephrolithiasis.  Advised patient Dr.Tat is out of the office and will return next week May not get an answer an until then.   Patient verbalized understanding.

## 2021-06-07 NOTE — Telephone Encounter (Signed)
Per Patient he not willing to increase the medication until he speaks with Dr.Tat. Advised patient Dr.Tat is out of the office and will return next Tuesday.   Patient Verbalized understanding. He will wait.

## 2021-06-11 NOTE — Telephone Encounter (Signed)
Patient called and left a message requesting a call back about his medicines.

## 2021-06-11 NOTE — Telephone Encounter (Signed)
Per patient he do not want to increase the Primidone. He rather have a kidney stone then the shakes. He can not hold a piece of toast.

## 2021-06-11 NOTE — Telephone Encounter (Signed)
Spoke to Dr.Tat, she prefer to do no harm or cause patient kidney stones.   She will not speak to patient. Patient was advised at his last three visit how she felt and medical advice.

## 2021-06-12 ENCOUNTER — Telehealth: Payer: Self-pay

## 2021-06-12 DIAGNOSIS — I5042 Chronic combined systolic (congestive) and diastolic (congestive) heart failure: Secondary | ICD-10-CM

## 2021-06-12 DIAGNOSIS — I2510 Atherosclerotic heart disease of native coronary artery without angina pectoris: Secondary | ICD-10-CM

## 2021-06-12 DIAGNOSIS — E782 Mixed hyperlipidemia: Secondary | ICD-10-CM

## 2021-06-12 NOTE — Telephone Encounter (Signed)
Lab orders placed for Monday 06/17/21, pt notified and states he will come early monday

## 2021-06-12 NOTE — Telephone Encounter (Signed)
-----   Message from Thayer Headings, MD sent at 06/11/2021  5:23 PM EDT ----- Please order fasting lipids, ALT, BMP for Monday the day before his OV  on Tuesday   Thanks  PN

## 2021-06-13 ENCOUNTER — Encounter: Payer: Self-pay | Admitting: Neurology

## 2021-06-13 NOTE — Telephone Encounter (Signed)
Called patient and informed him that Dr. Don Perking prior advice stands and she will not prescribe the medication he is requesting. Informed patient that I am calling on behalf of Dr. Arbutus Leas because she has discussed this with him multiple times and patient already knows why she cannot make this change in his medication. Patient stated that he is firing Dr. Arbutus Leas as his Neurologist and will have his PCP send a referral to a new Neurologist for him. Informed patient we will send a discharge letter in the mail for him per his request of not wanting Dr. Arbutus Leas as his neurologist no longer.

## 2021-06-13 NOTE — Telephone Encounter (Signed)
Called patients pharmacy and patient picked up his 90 day supply of primidone on 05/14/21 that was sent in on 05/05/21. Refills were canceled. Dr. Carles Collet is aware.

## 2021-06-17 ENCOUNTER — Other Ambulatory Visit: Payer: Medicare Other

## 2021-06-17 ENCOUNTER — Telehealth: Payer: Self-pay | Admitting: Neurology

## 2021-06-17 DIAGNOSIS — I5042 Chronic combined systolic (congestive) and diastolic (congestive) heart failure: Secondary | ICD-10-CM

## 2021-06-17 DIAGNOSIS — E782 Mixed hyperlipidemia: Secondary | ICD-10-CM

## 2021-06-17 DIAGNOSIS — I2510 Atherosclerotic heart disease of native coronary artery without angina pectoris: Secondary | ICD-10-CM

## 2021-06-17 LAB — BASIC METABOLIC PANEL
BUN/Creatinine Ratio: 23 (ref 10–24)
BUN: 22 mg/dL (ref 8–27)
CO2: 25 mmol/L (ref 20–29)
Calcium: 9 mg/dL (ref 8.6–10.2)
Chloride: 100 mmol/L (ref 96–106)
Creatinine, Ser: 0.96 mg/dL (ref 0.76–1.27)
Glucose: 76 mg/dL (ref 70–99)
Potassium: 3.9 mmol/L (ref 3.5–5.2)
Sodium: 139 mmol/L (ref 134–144)
eGFR: 81 mL/min/{1.73_m2} (ref >59)

## 2021-06-17 LAB — LIPID PANEL
Chol/HDL Ratio: 2 ratio (ref 0.0–5.0)
Cholesterol, Total: 113 mg/dL (ref 100–199)
HDL: 56 mg/dL (ref >39)
LDL Chol Calc (NIH): 45 mg/dL (ref 0–99)
Triglycerides: 49 mg/dL (ref 0–149)
VLDL Cholesterol Cal: 12 mg/dL (ref 5–40)

## 2021-06-17 LAB — ALT: ALT: 142 IU/L — ABNORMAL HIGH (ref 0–44)

## 2021-06-17 NOTE — Telephone Encounter (Signed)
Patient dismissed from [facility] per his request 06/13/21

## 2021-06-18 ENCOUNTER — Encounter: Payer: Self-pay | Admitting: Cardiovascular Disease

## 2021-06-18 ENCOUNTER — Ambulatory Visit: Payer: Medicare Other | Admitting: Cardiovascular Disease

## 2021-06-18 VITALS — BP 98/68 | HR 60 | Ht 65.0 in | Wt 210.2 lb

## 2021-06-18 DIAGNOSIS — R748 Abnormal levels of other serum enzymes: Secondary | ICD-10-CM

## 2021-06-18 DIAGNOSIS — I2510 Atherosclerotic heart disease of native coronary artery without angina pectoris: Secondary | ICD-10-CM

## 2021-06-18 NOTE — Patient Instructions (Signed)
Medication Instructions:  Your physician recommends that you continue on your current medications as directed. Please refer to the Current Medication list given to you today.  *If you need a refill on your cardiac medications before your next appointment, please call your pharmacy*   Lab Work: ALT (in 3 months) If you have labs (blood work) drawn today and your tests are completely normal, you will receive your results only by: MyChart Message (if you have MyChart) OR A paper copy in the mail If you have any lab test that is abnormal or we need to change your treatment, we will call you to review the results.  Testing/Procedures: ECHO (approx. 1 week prior to next office visit) Your physician has requested that you have an echocardiogram. Echocardiography is a painless test that uses sound waves to create images of your heart. It provides your doctor with information about the size and shape of your heart and how well your heart's chambers and valves are working. This procedure takes approximately one hour. There are no restrictions for this procedure.  Follow-Up: At [facility], you and your health needs are our priority.  As part of our continuing mission to provide you with exceptional heart care, we have created designated Provider Care Teams.  These Care Teams include your primary Cardiologist (physician) and Advanced Practice Providers (APPs -  Physician Assistants and Nurse Practitioners) who all work together to provide you with the care you need, when you need it.  Your next appointment:   6 month(s)  The format for your next appointment:   In Person  Provider:   Kristeen Miss, MD     Important Information About Sugar

## 2021-06-18 NOTE — Progress Notes (Signed)
Cardiology Office Note:    Date:  06/20/2021   ID:  Ian [name], DOB 1945-07-01, MRN [id number]  PCP:  Ian Pao, MD  Cardiologist:  Ian Moores, MD  Electrophysiologist:  None   Referring MD:  Ian [name]   Problem list 1.  Coronary artery disease 2.  Hyperlipidemia 3.  Hypertension 4.  Left bundle branch block 5.  Hyperlipidemia 6. Gastric bypass - ~2005 7  Anemia ( iron def. B12 deficiency )   Chief Complaint  Patient presents with   Coronary Artery Disease   Congestive Heart Failure        Ian [name] is a 76 y.o. male with a hx of   CAD, HTN, LBBB Just moved from [place].  Seen with wife Ian [name].  Ian [name] is seen today for the first time.  He has a history of coronary artery disease.  He has a history of stenting in August, 2019 in [place]. He is felt well following the stents. Recent  ( Sept. 2017, 2019) total cholesterol level is 90.  The HDL is 35.  The LDL is 46.  The triglyceride level is 44.  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 27, 2020: Ian [name] is seen today for follow-up of his coronary artery disease.  He has a history of hyperlipidemia.  He has a stable LBBB.  Busy in his wood shop Has been diagnosed with essential tremor ( is on Primidone)  We discussed drug interactions with his multiple cardiac meds.  No CP since his stenting several years ago   Jan. 31, 2022: Ian [name] is seen today for follow up of his CAD, HLD. LBBB  Hx of coronary stenting in 2019.  Still doing lots of woodworking .    He told me about a new table saw - the sawstop  Is very active in his shop but no real cardio exercise   June 29, 2020: Ian [name] is seen today for follow up of his CAD and LBBB, HTN Echo on Feb. 18, 2022 shows mildly reduced LV EF (40-45%) and he was started on Entresto 24-26 BID He has mild AS Is losing some weight  Is on Primidone and topamax  Has lost 20 pounds .  Dec. 6, 2022 Ian [name] is seen today for  follow up of his CAD, LBBB, HTN Echo in Feb. 2022 sows EF 40-45%. Was started on Entresto 24-26 BID   He had an episode of orthostatic hypotension while checking her wife's tires .  Had lost 35-40 lbs .  CT of the head was normal,  MRI of brain was normal  Was seen by Dr. Osborne [name].  Coreg was reduced to 3.125 BID  Feels better , no further episodes of syncope  He check his bp regularly ,    January 28, 2021: Ian [name] is seen today for a work in visit.  He was doing some woodworking last week and developed an episode of unresponsiveness.  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  His daughter was with him out in the shop and stated that he came to approximately a minute or so later.  Following that he felt fine.  There was no postictal symptoms.  There is no loss of  consciousness.  Has some vague right sided chest pain  - not similar to his angina. Not pleuretic  Has bee present for several weeks   Had  a similar episode while putting air in his wifes tires.  Sound like a TIA  Is on plavix   June 18, 2021; Ian [name] is seen today for follow up of his CAD, CHF, LBBB He has had some unusual neurologic issues ( near syncope / loss of awareness)  Has been evaluated by neuro - EEG was negative 14 day monitor did not reveal any significant arrhythmias.  Labs were reviewed ALT is 142. Lipids:  Chol is 113 HDL - 56 Trig - 49 LDL - 45   His tremor has generally worsened.  Wants to restart topamax  Ian [name] is concerned about kidney stones He has only had kidney stones on one occasion .   He is willing to take the risk of renal stones if he can get neuro to prescribe it again . He has taken it in the past with no issues.   Tolerating the Entresto well No orthostasis   Having difficulty swallowing  - will need to see GI at some point  Having R hip pain and R leg numbness on occasion ( after lots of exercise / stairs )   [facility]   May need a MRI   Past Medical History:  Diagnosis Date   Allergic rhinitis    Cataract    Coronary artery disease    Stent x1 2008   GERD (gastroesophageal reflux disease)    Hyperlipidemia    Hypertension     Past Surgical History:  Procedure Laterality Date   ABDOMINAL HERNIA REPAIR     CARDIAC CATHETERIZATION     stent   CORONARY ANGIOPLASTY WITH STENT PLACEMENT  2008   stent x1   ESOPHAGEAL MANOMETRY N/A 02/06/2016   Procedure: ESOPHAGEAL MANOMETRY (EM);  Surgeon: Ian Gunning, MD;  Location: [facility];  Service: Gastroenterology;  Laterality: N/A;   GASTRIC BYPASS  2005   GASTRIC BYPASS  2005   ROTATOR CUFF REPAIR Left 2007   TOTAL KNEE ARTHROPLASTY Bilateral     Current Medications: Current Meds  Medication Sig   albuterol (VENTOLIN HFA) 108 (90 Base) MCG/ACT inhaler 2 Puff(s) By Mouth Every 4 Hours PRN   atorvastatin (LIPITOR) 40 MG tablet TAKE 1 TABLET BY MOUTH EVERY DAY   carvedilol (COREG) 3.125 MG tablet Take 3.125 mg by mouth 2 (two) times daily with a meal.   Cholecalciferol (VITAMIN D3) 250 MCG (10000 UT) TABS Take 1,000 Units by mouth in the morning and at bedtime.   clopidogrel (PLAVIX) 75 MG tablet Take 1 tablet (75 mg total) by mouth daily.   CO-ENZYME Q-10 PO Take 200 mg by mouth daily.   ferrous sulfate 325 (65 FE) MG tablet Take 325 mg by mouth 2 (two) times daily with a meal.   methocarbamol (ROBAXIN) 500 MG tablet Take 500 mg by mouth 4 (four) times daily.   pantoprazole (PROTONIX) 40 MG tablet TAKE 1 TABLET(40 MG) BY MOUTH DAILY.   primidone (MYSOLINE) 50 MG tablet Take 1 tablet (50 mg total) by mouth 2 (two) times daily.   sacubitril-valsartan (ENTRESTO) 24-26 MG Take 1 tablet by mouth 2 (two) times daily.   Current Facility-Administered Medications for the 06/18/21 encounter (Office Visit) with Ian [name], Ian Cheng, MD  Medication   0.9 %  sodium chloride infusion     Allergies:   Sulfa antibiotics, Multihance  [gadobenate], and Tetanus toxoids   Social History   Socioeconomic History   Marital status: Married    Spouse name: Not on file  Number of children: 2   Years of education: Not on file   Highest education level: Not on file  Occupational History   Occupation: brick layer  Tobacco Use   Smoking status: Former    Packs/day: 1.00    Years: 18.00    Total pack years: 18.00    Types: Cigarettes    Start date: 02/09/1964    Quit date: 05/23/1982    Years since quitting: 39.1   Smokeless tobacco: Never   Tobacco comments:    Also smoked 4-5 cigars for 10 years after cigarettes  Substance and Sexual Activity   Alcohol use: Yes    Alcohol/week: 0.0 standard drinks of alcohol    Comment: 1 glass wine a month   Drug use: No   Sexual activity: Not on file  Other Topics Concern   Not on file  Social History Narrative   Originally from [place]. Has lived in [place], [place]. Previously has traveled to [place], [place], [place], [place], & on [place] cruises. Currently works as a Designer, fashion/clothing. No pets currently. Remote bird exposure. No mold or hot tub exposure. No known asbestos exposure.    Right handed   Lives with wife   Currently retired    International aid/development worker of Corporate investment banker Strain: Not on file  Food Insecurity: Not on file  Transportation Needs: Not on file  Physical Activity: Not on file  Stress: Not on file  Social Connections: Not on file     Family History: The patient's family history includes Breast cancer in his mother and sister; Colon cancer (age of onset: 70) in his father; Diabetes in his mother and sister; Heart disease in his father and mother; Prostate cancer in his father. There is no history of Lung disease or Esophageal cancer.  ROS:   Please see the history of present illness.     All other systems reviewed and are negative.  EKGs/Labs/Other Studies Reviewed:    The following studies were reviewed today:    Recent Labs: 10/10/2020:  Hemoglobin 14.1; Platelets 213 06/17/2021: ALT 142; BUN 22; Creatinine, Ser 0.96; Potassium 3.9; Sodium 139  Recent Lipid Panel    Component Value Date/Time   CHOL 113 06/17/2021 0741   TRIG 49 06/17/2021 0741   HDL 56 06/17/2021 0741   CHOLHDL 2.0 06/17/2021 0741   LDLCALC 45 06/17/2021 0741    Physical Exam: Blood pressure 98/68, pulse 60, height 5\' 5"  (1.651 m), weight 210 lb 3.2 oz (95.3 kg), SpO2 98 %.  GEN:  Well nourished, well developed in no acute distress HEENT: Normal NECK: No JVD; No carotid bruits LYMPHATICS: No lymphadenopathy CARDIAC: RRR , no murmurs, rubs, gallops RESPIRATORY:  Clear to auscultation without rales, wheezing or rhonchi  ABDOMEN: Soft, non-tender, non-distended MUSCULOSKELETAL:  No edema; No deformity  SKIN: Warm and dry NEUROLOGIC:  Alert and oriented x 3    ECG:        ASSESSMENT:    1. Coronary artery disease involving native coronary artery of native heart without angina pectoris   2. Liver enzyme elevation        PLAN:       Syncope : He has not had any further episodes of presyncope or this lack of awareness issue.  We will continue to follow.  He will continue to follow-up with neuro.   2.  coronary artery disease:   He is not having any episodes of angina.  We have discontinued his Ranexa so that he can  take primidone for his tremor.      3.    Hyperlipidemia:     4.  Chronic systolic congestive heart failure: Peder has longstanding left bundle branch block.  His last echo showed mildly reduced left ventricular systolic function with an EF of 40 to 45%.    He is tolerating the Entresto very well.  We will get an echocardiogram in 6 months and I will anticipate seeing him in the office approximately 1 week later.  5.  Elevated ALT: He has ALT was normal last year.  His ALT yesterday is 142.  He admits to taking lots of Tylenol recently.  He has been having lots of hip pain.  He will back off the Tylenol and we will  remeasure his ALT in 3 months.  I have asked him to touch base with Dr. Osborne [name] regarding this.    Medication Adjustments/Labs and Tests Ordered: Current medicines are reviewed at length with the patient today.  Concerns regarding medicines are outlined above.  Orders Placed This Encounter  Procedures   ALT   ECHOCARDIOGRAM COMPLETE     No orders of the defined types were placed in this encounter.     Patient Instructions  Medication Instructions:  Your physician recommends that you continue on your current medications as directed. Please refer to the Current Medication list given to you today.  *If you need a refill on your cardiac medications before your next appointment, please call your pharmacy*   Lab Work: ALT (in 3 months) If you have labs (blood work) drawn today and your tests are completely normal, you will receive your results only by: [place] (if you have MyChart) OR A paper copy in the mail If you have any lab test that is abnormal or we need to change your treatment, we will call you to review the results.  Testing/Procedures: ECHO (approx. 1 week prior to next office visit) Your physician has requested that you have an echocardiogram. Echocardiography is a painless test that uses sound waves to create images of your heart. It provides your doctor with information about the size and shape of your heart and how well your heart's chambers and valves are working. This procedure takes approximately one hour. There are no restrictions for this procedure.  Follow-Up: At [facility], you and your health needs are our priority.  As part of our continuing mission to provide you with exceptional heart care, we have created designated Provider Care Teams.  These Care Teams include your primary Cardiologist (physician) and Advanced Practice Providers (APPs -  Physician Assistants and Nurse Practitioners) who all work together to provide you with the care you need,  when you need it.  Your next appointment:   6 month(s)  The format for your next appointment:   In Person  Provider:   Mertie Moores, MD     Important Information About Sugar         Signed, Ian Moores, MD  06/20/2021 6:29 PM    [place]

## 2021-06-21 ENCOUNTER — Encounter: Payer: Self-pay | Admitting: Physician Assistant

## 2021-06-21 ENCOUNTER — Telehealth: Payer: Self-pay

## 2021-06-21 ENCOUNTER — Ambulatory Visit: Payer: Medicare Other | Admitting: Physician Assistant

## 2021-06-21 VITALS — BP 118/68 | HR 64 | Ht 65.0 in | Wt 208.8 lb

## 2021-06-21 DIAGNOSIS — K2270 Barrett's esophagus without dysplasia: Secondary | ICD-10-CM | POA: Diagnosis not present

## 2021-06-21 DIAGNOSIS — K220 Achalasia of cardia: Secondary | ICD-10-CM | POA: Diagnosis not present

## 2021-06-21 DIAGNOSIS — R1319 Other dysphagia: Secondary | ICD-10-CM | POA: Diagnosis not present

## 2021-06-21 MED ORDER — PANTOPRAZOLE SODIUM 40 MG PO TBEC: DELAYED_RELEASE_TABLET | ORAL | 3 refills | Status: DC

## 2021-06-21 NOTE — Patient Instructions (Signed)
Increase your Pantoprazole to twice daily.   We have sent the following medications to your pharmacy for you to pick up at your convenience: Pantoprazole   You will be contaced by our office prior to your procedure for directions on holding your Plavix.  If you do not hear from our office 1 week prior to your scheduled procedure, please call [phone] to discuss.  You have been scheduled for an endoscopy. Please follow written instructions given to you at your visit today. If you use inhalers (even only as needed), please bring them with you on the day of your procedure.  If you are age 78 or older, your body mass index should be between 23-30. Your Body mass index is 34.75 kg/m. If this is out of the aforementioned range listed, please consider follow up with your Primary Care Provider.  If you are age 58 or younger, your body mass index should be between 19-25. Your Body mass index is 34.75 kg/m. If this is out of the aformentioned range listed, please consider follow up with your Primary Care Provider.   ________________________________________________________  The [facility] providers would like to encourage you to use [facility] to communicate with providers for non-urgent requests or questions.  Due to long hold times on the telephone, sending your provider a message by [facility] may be a faster and more efficient way to get a response.  Please allow 48 business hours for a response.  Please remember that this is for non-urgent requests.  _______________________________________________________  Thank you for choosing me and [facility].  Hyacinth Meeker PA

## 2021-06-21 NOTE — Progress Notes (Signed)
Chief Complaint: Dysphagia and GERD  HPI:    Ian [name] is a 76 year old Caucasian male with a past medical history of CAD on Plavix, GERD and others listed below, known to Dr. Havery Moros, who was referred to me by Tisovec, Fransico Him, MD for a complaint of dysphagia and GERD.    01/21/2016 EGD with esophageal mucosal changes suggestive of Barrett's, dilation in the proximal esophagus and midesophagus with residual secretions indicative of stasis with overall findings concerning for underlying dysmotility, Roux-en-Y gastrojejunostomy with gastrojejunal anastomosis and a small gastric pouch which did not insufflate well.  Biopsies showed intestinal metaplasia consistent with Barrett's esophagus and repeat was recommended in 3 years.  He was then set up for esophageal manometry    02/06/2016 esophageal manometry showed no achalasia but it did show that he had a peristalsis.  At that time Dr. Havery Moros wanted a referral to rheumatology to rule out other mixed connective tissue diseases.  At that time recommended Omeprazole 40 twice daily.    Today, the patient presents to clinic and tells me that after time of his last procedures he does not feel like anything was explained to him at all.  He was told to chew his food well and go see rheumatology and did not really know why or what was found.  Tells me that he has continued with the same problems feeling like food seems to just get stuck or stop in his esophagus and that his esophagus closes up sometimes even a couple minutes before eating.  Lately this has been happening quite a bit and he is using a lot of over-the-counter Tums in order to help with this.  Also on Pantoprazole 40 mg once daily but continues with indigestion and does not feel like it helps that much.    Patient going on a trip up to [place] in the coming weeks for a week.    Denies fever, chills, weight loss or change in bowel habits.  Past Medical History:  Diagnosis Date   Allergic rhinitis     Cataract    Coronary artery disease    Stent x1 2008   GERD (gastroesophageal reflux disease)    Hyperlipidemia    Hypertension     Past Surgical History:  Procedure Laterality Date   ABDOMINAL HERNIA REPAIR     CARDIAC CATHETERIZATION     stent   CORONARY ANGIOPLASTY WITH STENT PLACEMENT  2008   stent x1   ESOPHAGEAL MANOMETRY N/A 02/06/2016   Procedure: ESOPHAGEAL MANOMETRY (EM);  Surgeon: Manus Gunning, MD;  Location: [facility];  Service: Gastroenterology;  Laterality: N/A;   GASTRIC BYPASS  2005   GASTRIC BYPASS  2005   ROTATOR CUFF REPAIR Left 2007   TOTAL KNEE ARTHROPLASTY Bilateral     Current Outpatient Medications  Medication Sig Dispense Refill   atorvastatin (LIPITOR) 40 MG tablet TAKE 1 TABLET BY MOUTH EVERY DAY 90 tablet 1   carvedilol (COREG) 3.125 MG tablet Take 3.125 mg by mouth 2 (two) times daily with a meal.     Cholecalciferol (VITAMIN D3) 250 MCG (10000 UT) TABS Take 1,000 Units by mouth in the morning and at bedtime.     clopidogrel (PLAVIX) 75 MG tablet Take 1 tablet (75 mg total) by mouth daily. 90 tablet 3   CO-ENZYME Q-10 PO Take 200 mg by mouth daily.     ferrous sulfate 325 (65 FE) MG tablet Take 325 mg by mouth 2 (two) times daily with a meal.  methocarbamol (ROBAXIN) 500 MG tablet Take 500 mg by mouth 4 (four) times daily.     primidone (MYSOLINE) 50 MG tablet Take 1 tablet (50 mg total) by mouth 2 (two) times daily. 180 tablet 1   sacubitril-valsartan (ENTRESTO) 24-26 MG Take 1 tablet by mouth 2 (two) times daily. 180 tablet 3   topiramate (TOPAMAX) 25 MG tablet Take 1 tablet by mouth daily.     pantoprazole (PROTONIX) 40 MG tablet TAKE 1 TABLET(40 MG) BY MOUTH TWICE DAILY 90 tablet 3   Current Facility-Administered Medications  Medication Dose Route Frequency Provider Last Rate Last Admin   0.9 %  sodium chloride infusion  500 mL Intravenous Continuous Armbruster, Carlota Raspberry, MD        Allergies as of 06/21/2021 - Review  Complete 06/21/2021  Allergen Reaction Noted   Sulfa antibiotics Other (See Comments) 05/23/2015   Multihance [gadobenate] Hives 03/28/2021   Tetanus toxoids  09/06/2015    Family History  Problem Relation Age of Onset   Breast cancer Mother    Diabetes Mother    Heart disease Mother    Prostate cancer Father    Colon cancer Father 18   Heart disease Father    Breast cancer Sister    Diabetes Sister    Lung disease Neg Hx    Esophageal cancer Neg Hx     Social History   Socioeconomic History   Marital status: Married    Spouse name: Not on file   Number of children: 2   Years of education: Not on file   Highest education level: Not on file  Occupational History   Occupation: Psychiatrist  Tobacco Use   Smoking status: Former    Packs/day: 1.00    Years: 18.00    Total pack years: 18.00    Types: Cigarettes    Start date: 02/09/1964    Quit date: 05/23/1982    Years since quitting: 39.1   Smokeless tobacco: Never   Tobacco comments:    Also smoked 4-5 cigars for 10 years after cigarettes  Vaping Use   Vaping Use: Former  Substance and Sexual Activity   Alcohol use: Yes    Alcohol/week: 0.0 standard drinks of alcohol    Comment: 1 glass wine a month   Drug use: No   Sexual activity: Not on file  Other Topics Concern   Not on file  Social History Narrative   Originally from [place]. Has lived in [place], [place]. Previously has traveled to [place], [place], [place], [place], & on [place]. Currently works as a Holiday representative. No pets currently. Remote bird exposure. No mold or hot tub exposure. No known asbestos exposure.    Right handed   Lives with wife   Currently retired    Investment banker, operational of Radio broadcast assistant Strain: Not on file  Food Insecurity: Not on file  Transportation Needs: Not on file  Physical Activity: Not on file  Stress: Not on file  Social Connections: Not on file  Intimate Partner Violence: Not on file    Review of  Systems:    Constitutional: No weight loss, fever or chills Skin: No rash  Cardiovascular: No chest pain Respiratory: No SOB  Gastrointestinal: See HPI and otherwise negative Genitourinary: No dysuria  Neurological: No headache, dizziness or syncope Musculoskeletal: No new muscle or joint pain Hematologic: No bleeding or bruising Psychiatric: No history of depression or anxiety   Physical Exam:  Vital signs: BP 118/68   Pulse  64   Ht 5\' 5"  (1.651 m)   Wt 208 lb 12.8 oz (94.7 kg)   SpO2 98%   BMI 34.75 kg/m   Constitutional:   Pleasant Elderly Caucasian male appears to be in NAD, Well developed, Well nourished, alert and cooperative Head:  Normocephalic and atraumatic. Eyes:   PEERL, EOMI. No icterus. Conjunctiva pink. Ears:  Normal auditory acuity. Neck:  Supple Throat: Oral cavity and pharynx without inflammation, swelling or lesion.  Respiratory: Respirations even and unlabored. Lungs clear to auscultation bilaterally.   No wheezes, crackles, or rhonchi.  Cardiovascular: Normal S1, S2. No MRG. Regular rate and rhythm. No peripheral edema, cyanosis or pallor.  Gastrointestinal:  Soft, nondistended, nontender. No rebound or guarding.  Increased bowel sounds all 4 quadrants. No appreciable masses or hepatomegaly. Rectal:  Not performed.  Msk:  Symmetrical without gross deformities. Without edema, no deformity or joint abnormality.  Neurologic:  Alert and  oriented x4;  grossly normal neurologically.  Skin:   Dry and intact without significant lesions or rashes. Psychiatric: Demonstrates good judgement and reason without abnormal affect or behaviors.  RELEVANT LABS AND IMAGING: CBC    Component Value Date/Time   WBC 7.3 10/10/2020 1757   RBC 4.43 10/10/2020 1757   HGB 14.1 10/10/2020 1757   HCT 44.1 10/10/2020 1757   PLT 213 10/10/2020 1757   MCV 99.5 10/10/2020 1757   MCH 31.8 10/10/2020 1757   MCHC 32.0 10/10/2020 1757   RDW 13.3 10/10/2020 1757   LYMPHSABS 1.7  10/10/2020 1757   MONOABS 0.8 10/10/2020 1757   EOSABS 0.3 10/10/2020 1757   BASOSABS 0.1 10/10/2020 1757    CMP     Component Value Date/Time   NA 139 06/17/2021 0741   K 3.9 06/17/2021 0741   CL 100 06/17/2021 0741   CO2 25 06/17/2021 0741   GLUCOSE 76 06/17/2021 0741   GLUCOSE 65 (L) 10/10/2020 1757   BUN 22 06/17/2021 0741   CREATININE 0.96 06/17/2021 0741   CALCIUM 9.0 06/17/2021 0741   PROT 5.5 (L) 10/10/2020 1757   PROT 6.0 02/06/2020 0835   ALBUMIN 3.4 (L) 10/10/2020 1757   ALBUMIN 4.1 02/06/2020 0835   AST 40 10/10/2020 1757   ALT 142 (H) 06/17/2021 0741   ALKPHOS 70 10/10/2020 1757   BILITOT 0.7 10/10/2020 1757   BILITOT 0.5 02/06/2020 0835   GFRNONAA >60 10/10/2020 1757   GFRAA 84 02/06/2020 0835    Assessment: 1.  Dysphagia: Continues, previous EGD with Barrett's in 2018 and manometry with aperistalsis which is likely the cause 2.  GERD: Regardless of Pantoprazole 40 mg daily and multiple times 3.  CAD on Plavix  Plan: 1.  Scheduled patient for repeat EGD for Barrett's surveillance and ongoing dysphagia with Dr. Havery Moros in the [facility].  Did provide the patient a detailed list of risks for the procedure and he agrees to proceed. Patient is appropriate for endoscopic procedure(s) in the ambulatory ([place]) setting.  2.  Patient was advised to hold his Plavix for 5 days prior to time of procedure.  We will communicate with his prescribing physician to ensure this is acceptable for him. 3.  Increased Pantoprazole to 40 mg twice daily, 30-60 minutes before breakfast and dinner.  Prescribed #60 with 5 refills. 4.  Spent time going through patient's previous results including Barrett's esophagus and aperistalsis.  Spent time answering all the patient's questions in regards to this.  Explained that I will make a note for Dr. Havery Moros due to personally call  him with results from this upcoming EGD so that he is not left with questions.  He was very thankful.  May need to  consider repeat referral to rheumatology for further discussion if this is still needed. 5.  Patient to follow in clinic per recommendations from Dr. Havery Moros after time of procedure.  Ian Newer, PA-C [facility] 06/21/2021, 10:12 AM  Cc: Haywood Pao, MD

## 2021-06-21 NOTE — Telephone Encounter (Signed)
Request for surgical clearance:     Endoscopy Procedure  What type of surgery is being performed? Endoscopy  When is this surgery scheduled?     07/10/2021  What type of clearance is required ?   Pharmacy  Are there any medications that need to be held prior to surgery and how long? Plavix x5 days   Practice name and name of physician performing surgery?      [facility]-Dr. Havery Moros   What is your office phone and fax number?      Phone- [phone]  [phone]  Anesthesia type (None, local, MAC, general) ?       MAC

## 2021-06-21 NOTE — Progress Notes (Signed)
Agree with assessment and plan as outlined.  

## 2021-06-24 NOTE — Telephone Encounter (Signed)
History of CAD with prior stenting August 2019 in [place]. There is no additional documentation of coronary intervention. May he hold his Plavix for 5 days prior to endoscopy?  Please route your response to p cv div preop  Thank you, Levi Aland, NP-C    06/24/2021, 4:36 PM [facility] [address], [address] Office [phone] Fax [phone]

## 2021-06-25 NOTE — Telephone Encounter (Signed)
   Primary Cardiologist: Kristeen Miss, MD  Chart reviewed as part of pre-operative protocol coverage. Given past medical history and time since last visit, based on ACC/AHA guidelines, Ian [name] would be at acceptable risk for the planned procedure without further cardiovascular testing.   Per Dr. Elease Hashimoto, he may hold his Plavix for 5 days prior to procedure. He should resume Plavix as soon as hemodynamically stable following the procedure.  I will route this recommendation to the requesting party via Epic fax function and remove from pre-op pool.  Please call with questions.  Levi Aland, NP-C    06/25/2021, 8:30 AM [facility] [address] Office [phone] Fax [phone]

## 2021-06-26 NOTE — Telephone Encounter (Signed)
Patient has been informed okay to hold Plavix x5 days prior to procedure per Dr. Melburn Popper. Pt voiced understanding.

## 2021-06-27 ENCOUNTER — Ambulatory Visit: Payer: Medicare Other | Admitting: Neurology

## 2021-07-07 ENCOUNTER — Encounter: Payer: Self-pay | Admitting: Certified Registered Nurse Anesthetist

## 2021-07-08 ENCOUNTER — Telehealth: Payer: Self-pay | Admitting: Physician Assistant

## 2021-07-09 ENCOUNTER — Encounter: Payer: Self-pay | Admitting: Certified Registered Nurse Anesthetist

## 2021-07-10 ENCOUNTER — Ambulatory Visit (AMBULATORY_SURGERY_CENTER): Payer: Medicare Other | Admitting: Gastroenterology

## 2021-07-10 ENCOUNTER — Encounter: Payer: Self-pay | Admitting: Gastroenterology

## 2021-07-10 ENCOUNTER — Other Ambulatory Visit: Payer: Self-pay | Admitting: Cardiovascular Disease

## 2021-07-10 VITALS — BP 129/80 | HR 52 | Temp 98.0°F | Resp 19 | Ht 65.0 in | Wt 208.0 lb

## 2021-07-10 DIAGNOSIS — R1319 Other dysphagia: Secondary | ICD-10-CM

## 2021-07-10 DIAGNOSIS — K2270 Barrett's esophagus without dysplasia: Secondary | ICD-10-CM

## 2021-07-10 MED ORDER — PANTOPRAZOLE SODIUM 40 MG PO TBEC: 40.0000 mg | DELAYED_RELEASE_TABLET | Freq: Two times a day (BID) | ORAL | 3 refills | Status: DC

## 2021-07-10 MED ORDER — SODIUM CHLORIDE 0.9 % IV SOLN: 500.0000 mL | Freq: Once | INTRAVENOUS | Status: DC

## 2021-07-10 NOTE — Op Note (Signed)
[facility] Patient Name: Ian [name] Procedure Date: 07/10/2021 11:06 AM MRN: [id number] Endoscopist: Remo Lipps P. Havery Moros , MD Age: 76 Referring MD:  Date of Birth: 06/18/1945 Gender: Male Account #: [id number] Procedure:                Upper GI endoscopy Indications:              Dysphagia, Follow-up of Barrett's esophagus -                            history of GERD with dilated esophagus in the past                            on imaging / EGD, aperistalsis noted without                            evidence of achalasia on prior manometry. Last EGD                            01/2016. Had recommended Rheumatology consultation                            at the time for connective tissue disorder                            evaluation. On protonix 40mg  once daily with some                            breakthrough reflux symptoms Medicines:                Monitored Anesthesia Care Procedure:                Pre-Anesthesia Assessment:                           - Prior to the procedure, a History and Physical                            was performed, and patient medications and                            allergies were reviewed. The patient's tolerance of                            previous anesthesia was also reviewed. The risks                            and benefits of the procedure and the sedation                            options and risks were discussed with the patient.                            All questions were answered, and informed consent  was obtained. Prior Anticoagulants: The patient has                            taken Plavix (clopidogrel), last dose was 5 days                            prior to procedure. ASA Grade Assessment: III - A                            patient with severe systemic disease. After                            reviewing the risks and benefits, the patient was                            deemed in satisfactory  condition to undergo the                            procedure.                           After obtaining informed consent, the endoscope was                            passed under direct vision. Throughout the                            procedure, the patient's blood pressure, pulse, and                            oxygen saturations were monitored continuously. The                            Endoscope was introduced through the mouth, and                            advanced to the jejunum. The upper GI endoscopy was                            accomplished without difficulty. The patient                            tolerated the procedure well. Scope In: Scope Out: Findings:                 A small hiatal hernia was present.                           Barrett's esophagus was present in the lower third                            of the esophagus. Due to hiatal hernia, gastric                            bypass anatomy,  and tortous turn in the lower                            esophagus, measurements of landmarks hard to                            identify. GEJ around 35cm from the incisors. There                            was a segment of Barrett's likely 2-3cm in length                            extending up from the GEJ to the lower esophagus.                            No nodularity. Biopsies were taken with a cold                            forceps for histology.                           The lumen of the esophagus was severely dilated.                            There was an angulated turn entering the hernia sac                            and into the stomach. Pooled secretions were in                            this area with some small amount of residual food                            that was removed with a Roth net. There was no                            stenosis or restrictured lumen at the GEJ, scope                            was eaily able to traverse this area which was                             widely patent.                           The exam of the esophagus was otherwise normal.                           Evidence of a Roux-en-Y gastrojejunostomy was                            found. The gastrojejunal anastomosis was  characterized by healthy appearing mucosa. The                            gastric pouch was small.                           The examined small bowel limb was normal. Complications:            No immediate complications. Estimated blood loss:                            Minimal. Estimated Blood Loss:     Estimated blood loss was minimal. Impression:               - Small hiatal hernia.                           - Barrett's esophagus as outlined. Biopsied.                           - Dilated / tortous esophagus with angulated turn                            at the GEJ which pooled secretions, consistent with                            underlying aperistalsis.                           - Roux-en-Y gastrojejunostomy with gastrojejunal                            anastomosis characterized by healthy appearing                            mucosa.                           - Normal examined small bowel limb.                           Overall, very difficult situation. 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 Stable changes of                            Barrett's seen - biopsies  taken. Recommendation:           - Patient has a contact number available for                            emergencies. The signs and symptoms of potential                            delayed complications were discussed with the                            patient. Return to normal activities tomorrow.                             Written discharge instructions were provided to the                            patient.                           - I think patient would feel better on soft /                            liquid diet - will discuss with him                           - Continue present medications.                           - Resume Plavix later today                           - Increase protonix to twice daily dosing                           - Await pathology results.                           - Consideration for rheumatology consult to                            evaluate for connective tissue disorder if not yet                            done, if the patient is willing Elcie Pelster P. Anaissa Macfadden, MD 07/10/2021 11:50:31 AM This report has been signed electronically.

## 2021-07-10 NOTE — Progress Notes (Signed)
Pt's states no medical or surgical changes since previsit or office visit. 

## 2021-07-10 NOTE — Progress Notes (Signed)
Report given to PACU, vss 

## 2021-07-10 NOTE — Progress Notes (Signed)
History and Physical Interval Note: Patient here for EGD for issues below. Seen on 06/21/21 - no interval changes. Off Plavix for 5 days. Continues to have GERD and intermittent dysphagia despite PPI. History of aperistalsis of the esophagus, was supposed to see Rheumatology in the past, does not appear that happened. Has not followed up until recently. Discussed his course with him, recommend EGD to re-evaluate, he understands and wants to proceed.  07/10/2021 11:15 AM  Ian [name]  has presented today for endoscopic procedure(s), with the diagnosis of  Encounter Diagnoses  Name Primary?   Barrett's esophagus without dysplasia Yes   Esophageal dysphagia   .  The various methods of evaluation and treatment have been discussed with the patient and/or family. After consideration of risks, benefits and other options for treatment, the patient has consented to  the endoscopic procedure(s).   The patient's history has been reviewed, patient examined, no change in status, stable for surgery.  I have reviewed the patient's chart and labs.  Questions were answered to the patient's satisfaction.    Harlin Rain, MD [facility]

## 2021-07-10 NOTE — Progress Notes (Signed)
Called to room to assist during endoscopic procedure.  Patient ID and intended procedure confirmed with present staff. Received instructions for my participation in the procedure from the performing physician.  

## 2021-07-10 NOTE — Progress Notes (Signed)
1105 Robinul 0.1 mg IV given due large amount of secretions upon assessment.  MD made aware, vss 

## 2021-07-10 NOTE — Patient Instructions (Signed)
Recommendation for soft/liquid diet.  Continue present medications.. Resume Plavix later today.    Increase protonix to twice daily dosing.  RX sent to pharmacy, need to pick up.    Await pathology results.  Consider rheumatology consult to evaluate for connective tissue disorder if not yet done, if patient willing.   YOU HAD AN ENDOSCOPIC PROCEDURE TODAY AT THE [facility]:   Refer to the procedure report that was given to you for any specific questions about what was found during the examination.  If the procedure report does not answer your questions, please call your gastroenterologist to clarify.  If you requested that your care partner not be given the details of your procedure findings, then the procedure report has been included in a sealed envelope for you to review at your convenience later.  YOU SHOULD EXPECT: Some feelings of bloating in the abdomen. Passage of more gas than usual.  Walking can help get rid of the air that was put into your GI tract during the procedure and reduce the bloating. If you had a lower endoscopy (such as a colonoscopy or flexible sigmoidoscopy) you may notice spotting of blood in your stool or on the toilet paper. If you underwent a bowel prep for your procedure, you may not have a normal bowel movement for a few days.  Please Note:  You might notice some irritation and congestion in your nose or some drainage.  This is from the oxygen used during your procedure.  There is no need for concern and it should clear up in a day or so.  SYMPTOMS TO REPORT IMMEDIATELY:   Following upper endoscopy (EGD)  Vomiting of blood or coffee ground material  New chest pain or pain under the shoulder blades  Painful or persistently difficult swallowing  New shortness of breath  Fever of 100F or higher  Black, tarry-looking stools  For urgent or emergent issues, a gastroenterologist can be reached at any hour by calling [phone]. Do not use MyChart  messaging for urgent concerns.    DIET:  We do recommend a small meal at first, but then you may proceed to your regular diet.  Drink plenty of fluids but you should avoid alcoholic beverages for 24 hours.  ACTIVITY:  You should plan to take it easy for the rest of today and you should NOT DRIVE or use heavy machinery until tomorrow (because of the sedation medicines used during the test).    FOLLOW UP: Our staff will call the number listed on your records the next business day following your procedure.  We will call around 7:15- 8:00 am to check on you and address any questions or concerns that you may have regarding the information given to you following your procedure. If we do not reach you, we will leave a message.  If you develop any symptoms (ie: fever, flu-like symptoms, shortness of breath, cough etc.) before then, please call [phone].  If you test positive for Covid 19 in the 2 weeks post procedure, please call and report this information to [place].    If any biopsies were taken you will be contacted by phone or by letter within the next 1-3 weeks.  Please call us at [phone] if you have not heard about the biopsies in 3 weeks.    SIGNATURES/CONFIDENTIALITY: You and/or your care partner have signed paperwork which will be entered into your electronic medical record.  These signatures attest to the fact that that the information above  on your After Visit Summary has been reviewed and is understood.  Full responsibility of the confidentiality of this discharge information lies with you and/or your care-partner.

## 2021-07-11 ENCOUNTER — Telehealth: Payer: Self-pay | Admitting: *Deleted

## 2021-07-11 NOTE — Telephone Encounter (Signed)
  Follow up Call-     07/10/2021   10:22 AM  Call back number  Post procedure Call Back phone  # [phone]  Permission to leave phone message Yes     Patient questions:  Do you have a fever, pain , or abdominal swelling? No. Pain Score  0 *  Have you tolerated food without any problems? Yes.    Have you been able to return to your normal activities? Yes.    Do you have any questions about your discharge instructions: Diet   No. Medications  No. Follow up visit  No.  Do you have questions or concerns about your Care? No.  Actions: * If pain score is 4 or above: No action needed, pain <4.

## 2021-07-15 ENCOUNTER — Other Ambulatory Visit: Payer: Self-pay

## 2021-07-15 DIAGNOSIS — K220 Achalasia of cardia: Secondary | ICD-10-CM

## 2021-07-15 DIAGNOSIS — K2270 Barrett's esophagus without dysplasia: Secondary | ICD-10-CM

## 2021-07-18 ENCOUNTER — Telehealth: Payer: Self-pay

## 2021-07-18 NOTE — Telephone Encounter (Signed)
Referral, records, pt's demographic, and insurance information have been faxed to [facility] at [phone].

## 2021-07-18 NOTE — Telephone Encounter (Signed)
Received a referral message from [facility]  Henriette Combs, LPN  Missy Sabins, RN We appreciate your referral. Each referral is reviewed by our providers to ensure that the patient will receive the best medical care possible. Upon review of this referral Dr. Corliss Skains and Dr. Dimple Casey have declined it.  We have no urgent appointments available.

## 2021-08-17 ENCOUNTER — Other Ambulatory Visit: Payer: Self-pay | Admitting: Cardiovascular Disease

## 2021-08-28 ENCOUNTER — Telehealth: Payer: Self-pay | Admitting: *Deleted

## 2021-08-28 NOTE — Telephone Encounter (Signed)
   Pre-operative Risk Assessment    Patient Name: Ian [name]  DOB: Feb 01, 1945 MRN: [id number]      Request for Surgical Clearance    Procedure:   LUMBAR ESI  Date of Surgery:  Clearance 09/24/21                                 Surgeon:  NOT LISTED Surgeon's Group or Practice Name:  [facility] Phone number:  [phone] EXT [phone] Nolene Bernheim Fax number:  [phone]   Type of Clearance Requested:   - Medical  - Pharmacy:  Hold Clopidogrel (Plavix) x 7 DAYS PRIOR   Type of Anesthesia:  Not Indicated   Additional requests/questions:    Ian [name]   08/28/2021, 4:32 PM

## 2021-08-29 ENCOUNTER — Telehealth: Payer: Self-pay | Admitting: *Deleted

## 2021-08-29 NOTE — Telephone Encounter (Signed)
Pt has been scheduled for a tele visit, 09/05/21 10:00.  Consent on file / medications reconciled.    Patient Consent for Virtual Visit        Ian [name] has provided verbal consent on 08/29/2021 for a virtual visit (video or telephone).   CONSENT FOR VIRTUAL VISIT FOR:  Ian [name]  By participating in this virtual visit I agree to the following:  I hereby voluntarily request, consent and authorize [facility] and its employed or contracted physicians, physician assistants, nurse practitioners or other licensed health care professionals (the Practitioner), to provide me with telemedicine health care services (the "Services") as deemed necessary by the treating Practitioner. I acknowledge and consent to receive the Services by the Practitioner via telemedicine. I understand that the telemedicine visit will involve communicating with the Practitioner through live audiovisual communication technology and the disclosure of certain medical information by electronic transmission. I acknowledge that I have been given the opportunity to request an in-person assessment or other available alternative prior to the telemedicine visit and am voluntarily participating in the telemedicine visit.  I understand that I have the right to withhold or withdraw my consent to the use of telemedicine in the course of my care at any time, without affecting my right to future care or treatment, and that the Practitioner or I may terminate the telemedicine visit at any time. I understand that I have the right to inspect all information obtained and/or recorded in the course of the telemedicine visit and may receive copies of available information for a reasonable fee.  I understand that some of the potential risks of receiving the Services via telemedicine include:  Delay or interruption in medical evaluation due to technological equipment failure or disruption; Information transmitted may not be sufficient (e.g. poor  resolution of images) to allow for appropriate medical decision making by the Practitioner; and/or  In rare instances, security protocols could fail, causing a breach of personal health information.  Furthermore, I acknowledge that it is my responsibility to provide information about my medical history, conditions and care that is complete and accurate to the best of my ability. I acknowledge that Practitioner's advice, recommendations, and/or decision may be based on factors not within their control, such as incomplete or inaccurate data provided by me or distortions of diagnostic images or specimens that may result from electronic transmissions. I understand that the practice of medicine is not an exact science and that Practitioner makes no warranties or guarantees regarding treatment outcomes. I acknowledge that a copy of this consent can be made available to me via my patient portal [facility] MyChart), or I can request a printed copy by calling the office of [facility].    I understand that my insurance will be billed for this visit.   I have read or had this consent read to me. I understand the contents of this consent, which adequately explains the benefits and risks of the Services being provided via telemedicine.  I have been provided ample opportunity to ask questions regarding this consent and the Services and have had my questions answered to my satisfaction. I give my informed consent for the services to be provided through the use of telemedicine in my medical care

## 2021-08-29 NOTE — Telephone Encounter (Signed)
   Name: Ian [name]  DOB: Mar 31, 1945  MRN: [id number]  Primary Cardiologist: Kristeen Miss, MD   Preoperative team, please contact this patient and set up a phone call appointment for further preoperative risk assessment. Please obtain consent and complete medication review. Thank you for your help.  I confirm that guidance regarding antiplatelet and oral anticoagulation therapy has been completed and, if necessary, noted below.  Per office protocol, if patient is without any new symptoms or concerns at the time of their virtual visit, he/she may hold Plavix for 5 days prior to procedure. Please resume Plavix as soon as possible postprocedure, at the discretion of the surgeon.    Joylene Grapes, NP 08/29/2021, 11:23 AM [facility]

## 2021-08-29 NOTE — Telephone Encounter (Signed)
Pt has been scheduled for a tele visit, 09/05/21 10:00.

## 2021-09-05 ENCOUNTER — Ambulatory Visit (INDEPENDENT_AMBULATORY_CARE_PROVIDER_SITE_OTHER): Payer: Medicare Other | Admitting: Physician Assistant

## 2021-09-05 DIAGNOSIS — Z01810 Encounter for preprocedural cardiovascular examination: Secondary | ICD-10-CM | POA: Diagnosis not present

## 2021-09-05 NOTE — Progress Notes (Signed)
Virtual Visit via Telephone Note   Because of Ian [name]'s co-morbid illnesses, he is at least at moderate risk for complications without adequate follow up.  This format is felt to be most appropriate for this patient at this time.  The patient did not have access to video technology/had technical difficulties with video requiring transitioning to audio format only (telephone).  All issues noted in this document were discussed and addressed.  No physical exam could be performed with this format.  Please refer to the patient's chart for his consent to telehealth for [facility].  Evaluation Performed:  Preoperative cardiovascular risk assessment _____________   Date:  09/05/2021   Patient ID:  Ian [name], DOB 04-30-1945, MRN [id number] Patient Location:  Home Provider location:   Office  Primary Care Provider:  Tisovec, Adelfa Koh, MD Primary Cardiologist:  Kristeen Miss, MD  Chief Complaint / Patient Profile   76 y.o. y/o male with a h/o CAD, hyperlipidemia, hypertension, left bundle branch block, hyperlipidemia, gastric bypass (2005), anemia (iron deficiency B12 deficiency) who is pending Lumbar ESI and presents today for telephonic preoperative cardiovascular risk assessment.  Past Medical History    Past Medical History:  Diagnosis Date   Allergic rhinitis    Cataract    Coronary artery disease    Stent x1 2008   GERD (gastroesophageal reflux disease)    Hyperlipidemia    Hypertension    Past Surgical History:  Procedure Laterality Date   ABDOMINAL HERNIA REPAIR     CARDIAC CATHETERIZATION     stent   CORONARY ANGIOPLASTY WITH STENT PLACEMENT  2008   stent x1   ESOPHAGEAL MANOMETRY N/A 02/06/2016   Procedure: ESOPHAGEAL MANOMETRY (EM);  Surgeon: Ruffin Frederick, MD;  Location: [facility];  Service: Gastroenterology;  Laterality: N/A;   GASTRIC BYPASS  2005   GASTRIC BYPASS  2005   ROTATOR CUFF REPAIR Left 2007   TOTAL KNEE ARTHROPLASTY Bilateral      Allergies  Allergies  Allergen Reactions   Sulfa Antibiotics Other (See Comments)    As child; unknown   Multihance [Gadobenate] Hives    1 singular hive post contrast    Tetanus Toxoids     The horse serum for tetanus    History of Present Illness    Ian [name] is a 76 y.o. male who presents via audio/video conferencing for a telehealth visit today.  Pt was last seen in cardiology clinic on 06/18/2021 by Dr. Elease Hashimoto.  At that time Ian [name] was doing well .  The patient is now pending procedure as outlined above. Since his last visit, he has been doing fine.  He has not had any chest pain or shortness of breath.  He tries to remain active and has no issues with walking or climbing stairs.  He does most of the indoor household work.  He also has a wood shop which he spends most of his days there.  He has scored a 4.74 on the DASI which exceeds the 4 METS minimum requirement.  We discussed holding Plavix x5 days prior to his epidural and resuming as soon as it is medically safe to do so per the proceduralist.  Home Medications    Prior to Admission medications   Medication Sig Start Date End Date Taking? Authorizing Provider  ascorbic acid (VITAMIN C) 500 MG tablet Take 500 mg by mouth 2 (two) times daily.    [provider]  atorvastatin (LIPITOR) 40 MG tablet TAKE 1 TABLET BY MOUTH  EVERY DAY 08/20/21   Nahser, Deloris Ping, MD  carvedilol (COREG) 3.125 MG tablet Take 3.125 mg by mouth 2 (two) times daily with a meal.    [provider]  Cholecalciferol (VITAMIN D3) 250 MCG (10000 UT) TABS Take 1,000 Units by mouth in the morning and at bedtime.    [provider]  clopidogrel (PLAVIX) 75 MG tablet Take 1 tablet (75 mg total) by mouth daily. 01/28/21   Nahser, Deloris Ping, MD  CO-ENZYME Q-10 PO Take 200 mg by mouth daily.    [provider]  ferrous sulfate 325 (65 FE) MG tablet Take 325 mg by mouth 2 (two) times daily with a meal. 09/02/17   [provider]  pantoprazole (PROTONIX) 40 MG tablet Take 1 tablet (40 mg total) by mouth 2 (two) times daily. 07/10/21   Armbruster, Willaim Rayas, MD  primidone (MYSOLINE) 50 MG tablet Take 1 tablet (50 mg total) by mouth 2 (two) times daily. 05/07/21   Tat, Octaviano Batty, DO  sacubitril-valsartan (ENTRESTO) 24-26 MG Take 1 tablet by mouth 2 (two) times daily. 01/28/21 01/23/22  Nahser, Deloris Ping, MD  topiramate (TOPAMAX) 25 MG tablet Take 1 tablet by mouth daily.    [provider]    Physical Exam    Vital Signs:  Ian [name] does not have vital signs available for review today.  Given telephonic nature of communication, physical exam is limited. AAOx3. NAD. Normal affect.  Speech and respirations are unlabored.  Accessory Clinical Findings    None  Assessment & Plan    1.  Preoperative Cardiovascular Risk Assessment:  Ian [name] perioperative risk of a major cardiac event is 0.9% according to the Revised Cardiac Risk Index (RCRI).  Therefore, he is at low risk for perioperative complications.   His functional capacity is fair at 4.74 METs according to the [facility] Activity Status Index (DASI). Recommendations: According to ACC/AHA guidelines, no further cardiovascular testing needed.  The patient may proceed to surgery at acceptable risk.   Antiplatelet and/or Anticoagulation Recommendations: Clopidogrel (Plavix) can be held for 5 days prior to his surgery and resumed as soon as possible post op.  A copy of this note will be routed to requesting surgeon.  Time:   Today, I have spent 10 minutes with the patient with telehealth technology discussing medical history, symptoms, and management plan.     Sharlene Dory, PA-C  09/05/2021, 10:09 AM

## 2021-09-18 ENCOUNTER — Other Ambulatory Visit: Payer: Medicare Other

## 2021-10-08 ENCOUNTER — Ambulatory Visit: Payer: Medicare Other | Admitting: Neurology

## 2021-12-16 ENCOUNTER — Ambulatory Visit (HOSPITAL_COMMUNITY): Payer: Medicare Other | Attending: Cardiovascular Disease

## 2021-12-16 DIAGNOSIS — I2510 Atherosclerotic heart disease of native coronary artery without angina pectoris: Secondary | ICD-10-CM | POA: Diagnosis present

## 2021-12-16 DIAGNOSIS — R748 Abnormal levels of other serum enzymes: Secondary | ICD-10-CM | POA: Diagnosis not present

## 2021-12-16 LAB — ECHOCARDIOGRAM COMPLETE
AV Mean grad: 13 mmHg
AV Peak grad: 25 mmHg
Ao pk vel: 2.5 m/s
Area-P 1/2: 2.12 cm2
S' Lateral: 3.7 cm

## 2021-12-22 ENCOUNTER — Encounter: Payer: Self-pay | Admitting: Cardiovascular Disease

## 2021-12-22 NOTE — Progress Notes (Unsigned)
Cardiology Office Note:    Date:  12/23/2021   ID:  Ian [name], DOB March 31, 1945, MRN [id number]  PCP:  Gaspar Garbe, MD  Cardiologist:  Ian Miss, MD  Electrophysiologist:  None   Referring MD:  Tisovec   Problem list 1.  Coronary artery disease 2.  Hyperlipidemia 3.  Hypertension 4.  Left bundle branch block 5.  Hyperlipidemia 6. Gastric bypass - ~2005 7  Anemia ( iron def. B12 deficiency )   Chief Complaint  Patient presents with   Coronary Artery Disease        Congestive Heart Failure          Loss of Consciousness        Ian [name] is a 76 y.o. male with a hx of   CAD, HTN, LBBB Just moved from [place].  Seen with wife Ian [name].  Ian [name] is seen today for the first time.  He has a history of coronary artery disease.  He has a history of stenting in August, 2019 in [place]. He is felt well following the stents. Recent  ( Sept. 2017, 2019) total cholesterol level is 90.  The HDL is 35.  The LDL is 46.  The triglyceride level is 44.  Retired Presenter, broadcasting / Surveyor, minerals  Works in his [employer] now  No real exercise   OCT. 27, 2020: Ian [name] is seen today for follow-up of his coronary artery disease.  He has a history of hyperlipidemia.  He has a stable LBBB.  Busy in his wood shop Has been diagnosed with essential tremor ( is on Primidone)  We discussed drug interactions with his multiple cardiac meds.  No CP since his stenting several years ago   Jan. 31, 2022: Ian [name] is seen today for follow up of his CAD, HLD. LBBB  Hx of coronary stenting in 2019.  Still doing lots of woodworking .    He told me about a new table saw - the sawstop  Is very active in his shop but no real cardio exercise   June 29, 2020: Ian [name] is seen today for follow up of his CAD and LBBB, HTN Echo on Feb. 18, 2022 shows mildly reduced LV EF (40-45%) and he was started on Entresto 24-26 BID He has mild AS Is losing some weight  Is on Primidone and topamax  Has lost 20 pounds  .  Dec. 6, 2022 Ian [name] is seen today for follow up of his CAD, LBBB, HTN Echo in Feb. 2022 sows EF 40-45%. Was started on Entresto 24-26 BID   He had an episode of orthostatic hypotension while checking her wife's tires .  Had lost 35-40 lbs .  CT of the head was normal,  MRI of brain was normal  Was seen by Dr. Wylene Simmer.  Coreg was reduced to 3.125 BID  Feels better , no further episodes of syncope  He check his bp regularly ,    January 28, 2021: Ian [name] is seen today for a work in visit.  He was doing some woodworking last week and developed an episode of unresponsiveness.  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  His daughter was with him out in the shop and stated that he came to approximately a minute or so later.  Following that he felt fine.  There was no postictal symptoms.  There is no loss of  consciousness.  Has some vague right sided chest pain  -  not similar to his angina. Not pleuretic  Has bee present for several weeks   Had a similar episode while putting air in his wifes tires.  Sound like a TIA  Is on plavix   June 18, 2021; Ian [name] is seen today for follow up of his CAD, CHF, LBBB He has had some unusual neurologic issues ( near syncope / loss of awareness)  Has been evaluated by neuro - EEG was negative 14 day monitor did not reveal any significant arrhythmias.  Labs were reviewed ALT is 142. Lipids:  Chol is 113 HDL - 56 Trig - 49 LDL - 45   His tremor has generally worsened.  Wants to restart topamax  Dr. Arbutus Leasat is concerned about kidney stones He has only had kidney stones on one occasion .   He is willing to take the risk of renal stones if he can get neuro to prescribe it again . He has taken it in the past with no issues.   Tolerating the Entresto well No orthostasis   Having difficulty swallowing  - will need to see GI at some point  Having R hip pain and R leg numbness on occasion (  after lots of exercise / stairs )   [facility].   May need a MRI   Dec. 11, 2023  Ian [name] is seen for follow up of his CAD, stenting  ( 2018, 2020) CHF,  Had another episode of syncope 2 weeks ago . While blowing leaves .  Fell on his arm and shoulder  May have had some mild presyncope prior to the  No CP , breathing is ok      Past Medical History:  Diagnosis Date   Allergic rhinitis    Cataract    Coronary artery disease    Stent x1 2008   GERD (gastroesophageal reflux disease)    Hyperlipidemia    Hypertension     Past Surgical History:  Procedure Laterality Date   ABDOMINAL HERNIA REPAIR     CARDIAC CATHETERIZATION     stent   CORONARY ANGIOPLASTY WITH STENT PLACEMENT  2008   stent x1   ESOPHAGEAL MANOMETRY N/A 02/06/2016   Procedure: ESOPHAGEAL MANOMETRY (EM);  Surgeon: Ruffin FrederickSteven Paul Armbruster, MD;  Location: [facility];  Service: Gastroenterology;  Laterality: N/A;   GASTRIC BYPASS  2005   GASTRIC BYPASS  2005   ROTATOR CUFF REPAIR Left 2007   TOTAL KNEE ARTHROPLASTY Bilateral     Current Medications: Current Meds  Medication Sig   ascorbic acid (VITAMIN C) 500 MG tablet Take 500 mg by mouth 2 (two) times daily.   atorvastatin (LIPITOR) 40 MG tablet TAKE 1 TABLET BY MOUTH EVERY DAY   Cholecalciferol (VITAMIN D3) 250 MCG (10000 UT) TABS Take 1,000 Units by mouth in the morning and at bedtime.   clopidogrel (PLAVIX) 75 MG tablet Take 1 tablet (75 mg total) by mouth daily.   CO-ENZYME Q-10 PO Take 200 mg by mouth daily.   ferrous sulfate 325 (65 FE) MG tablet Take 325 mg by mouth 2 (two) times daily with a meal.   pantoprazole (PROTONIX) 40 MG tablet Take 1 tablet (40 mg total) by mouth 2 (two) times daily.   primidone (MYSOLINE) 50 MG tablet Take 1 tablet (50 mg total) by mouth 2 (two) times daily.   sacubitril-valsartan (ENTRESTO) 24-26 MG Take 1 tablet by mouth 2 (two) times daily.   topiramate (TOPAMAX) 25 MG tablet Take 1 tablet by mouth daily.    [  DISCONTINUED] carvedilol (COREG) 3.125 MG tablet Take 3.125 mg by mouth 2 (two) times daily with a meal.   Current Facility-Administered Medications for the 12/23/21 encounter (Office Visit) with Recardo Linn, Deloris Ping, MD  Medication   0.9 %  sodium chloride infusion     Allergies:   Sulfa antibiotics, Multihance [gadobenate], and Tetanus toxoids   Social History   Socioeconomic History   Marital status: Married    Spouse name: Not on file   Number of children: 2   Years of education: Not on file   Highest education level: Not on file  Occupational History   Occupation: brick layer  Tobacco Use   Smoking status: Former    Packs/day: 1.00    Years: 18.00    Total pack years: 18.00    Types: Cigarettes    Start date: 02/09/1964    Quit date: 05/23/1982    Years since quitting: 39.6   Smokeless tobacco: Never   Tobacco comments:    Also smoked 4-5 cigars for 10 years after cigarettes  Vaping Use   Vaping Use: Former  Substance and Sexual Activity   Alcohol use: Yes    Alcohol/week: 0.0 standard drinks of alcohol    Comment: 1 glass wine a month   Drug use: No   Sexual activity: Not on file  Other Topics Concern   Not on file  Social History Narrative   Originally from [place]. Has lived in [place], [place]. Previously has traveled to [place], [place], [place], [place], & on [place] cruises. Currently works as a Designer, fashion/clothing. No pets currently. Remote bird exposure. No mold or hot tub exposure. No known asbestos exposure.    Right handed   Lives with wife   Currently retired    International aid/development worker of Corporate investment banker Strain: Not on file  Food Insecurity: Not on file  Transportation Needs: Not on file  Physical Activity: Not on file  Stress: Not on file  Social Connections: Not on file     Family History: The patient's family history includes Breast cancer in his mother and sister; Colon cancer (age of onset: 22) in his father; Diabetes in his mother and sister;  Heart disease in his father and mother; Prostate cancer in his father. There is no history of Lung disease or Esophageal cancer.  ROS:   Please see the history of present illness.     All other systems reviewed and are negative.  EKGs/Labs/Other Studies Reviewed:    The following studies were reviewed today:    Recent Labs: 06/17/2021: ALT 142; BUN 22; Creatinine, Ser 0.96; Potassium 3.9; Sodium 139  Recent Lipid Panel    Component Value Date/Time   CHOL 113 06/17/2021 0741   TRIG 49 06/17/2021 0741   HDL 56 06/17/2021 0741   CHOLHDL 2.0 06/17/2021 0741   LDLCALC 45 06/17/2021 0741    Physical Exam: Blood pressure 114/62, pulse 67, height 5\' 6"  (1.676 m), weight 194 lb (88 kg), SpO2 95 %.       GEN:  Well nourished, well developed in no acute distress HEENT: Normal NECK: No JVD; No carotid bruits LYMPHATICS: No lymphadenopathy CARDIAC: RRR, no murmurs, rubs, gallops RESPIRATORY:  Clear to auscultation without rales, wheezing or rhonchi  ABDOMEN: Soft, non-tender, non-distended MUSCULOSKELETAL:  No edema; No deformity  SKIN: Warm and dry NEUROLOGIC:  Alert and oriented x 3    ECG:    Dec. 112, 2023>   NSR ,  1st degree AV block .  LBBB   No changes    ASSESSMENT:    1. Syncope, unspecified syncope type   2. Coronary artery disease involving native coronary artery of native heart without angina pectoris   3. Chronic combined systolic and diastolic heart failure (HCC)         PLAN:       Syncope :  Ian [name] had another episode of syncope several weeks ago while blowing leaves Will refer to EP for implantable loop recorder DC coreg To take his BP daily      2.  coronary artery disease:   Status post stenting in 2018 and again in 2020.  He denies having any episodes of angina.     3.    Hyperlipidemia:  lipids look great    4.  Chronic systolic congestive heart failure:  Cont entresto.   Hold coreg for now since has had several episodes of syncope.   He may be having episodes of sinus pause or complete heart block.   5.  Elevated ALT: This been followed and managed by Dr. Wylene Simmer     Medication Adjustments/Labs and Tests Ordered: Current medicines are reviewed at length with the patient today.  Concerns regarding medicines are outlined above.  Orders Placed This Encounter  Procedures   Ambulatory referral to Cardiac Electrophysiology   EKG 12-Lead     No orders of the defined types were placed in this encounter.      Patient Instructions  Medication Instructions:  STOP Carvedilol (Coreg) *If you need a refill on your cardiac medications before your next appointment, please call your pharmacy*   Lab Work: NONE If you have labs (blood work) drawn today and your tests are completely normal, you will receive your results only by: MyChart Message (if you have MyChart) OR A paper copy in the mail If you have any lab test that is abnormal or we need to change your treatment, we will call you to review the results.   Testing/Procedures: Ambulatory Referral to Cardiac Electrophysiology (ILR)   Follow-Up: At [facility], you and your health needs are our priority.  As part of our continuing mission to provide you with exceptional heart care, we have created designated Provider Care Teams.  These Care Teams include your primary Cardiologist (physician) and Advanced Practice Providers (APPs -  Physician Assistants and Nurse Practitioners) who all work together to provide you with the care you need, when you need it.  Your next appointment:   Wed. 06/25/22 @ 8:20am  The format for your next appointment:   In Person  Provider:   Kristeen Miss, MD       Important Information About Sugar         Signed, Ian Miss, MD  12/23/2021 6:10 PM    [facility]

## 2021-12-23 ENCOUNTER — Encounter: Payer: Self-pay | Admitting: Cardiovascular Disease

## 2021-12-23 ENCOUNTER — Ambulatory Visit: Payer: Medicare Other | Attending: Cardiovascular Disease | Admitting: Cardiovascular Disease

## 2021-12-23 VITALS — BP 114/62 | HR 67 | Ht 66.0 in | Wt 194.0 lb

## 2021-12-23 DIAGNOSIS — R55 Syncope and collapse: Secondary | ICD-10-CM

## 2021-12-23 DIAGNOSIS — I5042 Chronic combined systolic (congestive) and diastolic (congestive) heart failure: Secondary | ICD-10-CM

## 2021-12-23 DIAGNOSIS — I2510 Atherosclerotic heart disease of native coronary artery without angina pectoris: Secondary | ICD-10-CM | POA: Diagnosis not present

## 2021-12-23 NOTE — Patient Instructions (Signed)
Medication Instructions:  STOP Carvedilol (Coreg) *If you need a refill on your cardiac medications before your next appointment, please call your pharmacy*   Lab Work: NONE If you have labs (blood work) drawn today and your tests are completely normal, you will receive your results only by: MyChart Message (if you have MyChart) OR A paper copy in the mail If you have any lab test that is abnormal or we need to change your treatment, we will call you to review the results.   Testing/Procedures: Ambulatory Referral to Cardiac Electrophysiology (ILR)   Follow-Up: At [facility], you and your health needs are our priority.  As part of our continuing mission to provide you with exceptional heart care, we have created designated Provider Care Teams.  These Care Teams include your primary Cardiologist (physician) and Advanced Practice Providers (APPs -  Physician Assistants and Nurse Practitioners) who all work together to provide you with the care you need, when you need it.  Your next appointment:   Wed. 06/25/22 @ 8:20am  The format for your next appointment:   In Person  Provider:   Kristeen Miss, MD       Important Information About Sugar

## 2021-12-30 ENCOUNTER — Ambulatory Visit: Payer: Medicare Other | Attending: Cardiovascular Disease | Admitting: Cardiovascular Disease

## 2021-12-30 ENCOUNTER — Encounter: Payer: Self-pay | Admitting: Cardiovascular Disease

## 2021-12-30 VITALS — BP 110/62 | HR 70 | Ht 66.0 in | Wt 194.2 lb

## 2021-12-30 DIAGNOSIS — R55 Syncope and collapse: Secondary | ICD-10-CM

## 2021-12-30 DIAGNOSIS — R7989 Other specified abnormal findings of blood chemistry: Secondary | ICD-10-CM | POA: Insufficient documentation

## 2021-12-30 DIAGNOSIS — K909 Intestinal malabsorption, unspecified: Secondary | ICD-10-CM | POA: Insufficient documentation

## 2021-12-30 NOTE — Progress Notes (Addendum)
Electrophysiology Office Note:    Date:  12/30/2021   ID:  Ian [name], DOB 1945/04/15, MRN [id number]  PCP:  Gaspar Garbe, MD   [facility] Providers Cardiologist:  Kristeen Miss, MD Electrophysiologist:  Maurice Small, MD     Referring MD: Gaspar Garbe, MD   History of Present Illness:    Ian [name] is a 76 y.o. male with a hx listed below, significant for LBBB, mildly reduced EF (40-45%), recurrent syncope, referred for loop recorder placement.  He became unresponsive and lost consciousness while using a router in his wood shop in January this year. He had a second episode of syncope while blowing leaves earlier this month. There was a little prodrome with lightheadedness during the first episode; the second episode was very abrupt.    He denies chest pain, shortness of breath.   Past Medical History:  Diagnosis Date   Allergic rhinitis    Cataract    Coronary artery disease    Stent x1 2008   GERD (gastroesophageal reflux disease)    Hyperlipidemia    Hypertension     Past Surgical History:  Procedure Laterality Date   ABDOMINAL HERNIA REPAIR     CARDIAC CATHETERIZATION     stent   CORONARY ANGIOPLASTY WITH STENT PLACEMENT  2008   stent x1   ESOPHAGEAL MANOMETRY N/A 02/06/2016   Procedure: ESOPHAGEAL MANOMETRY (EM);  Surgeon: Ruffin Frederick, MD;  Location: [facility];  Service: Gastroenterology;  Laterality: N/A;   GASTRIC BYPASS  2005   GASTRIC BYPASS  2005   ROTATOR CUFF REPAIR Left 2007   TOTAL KNEE ARTHROPLASTY Bilateral     Current Medications: Current Meds  Medication Sig   ascorbic acid (VITAMIN C) 500 MG tablet Take 500 mg by mouth 2 (two) times daily.   atorvastatin (LIPITOR) 40 MG tablet TAKE 1 TABLET BY MOUTH EVERY DAY   Cholecalciferol (VITAMIN D3) 250 MCG (10000 UT) TABS Take 1,000 Units by mouth in the morning and at bedtime.   clopidogrel (PLAVIX) 75 MG tablet Take 1 tablet (75 mg total) by mouth daily.    CO-ENZYME Q-10 PO Take 200 mg by mouth daily.   ferrous sulfate 325 (65 FE) MG tablet Take 325 mg by mouth 2 (two) times daily with a meal.   pantoprazole (PROTONIX) 40 MG tablet Take 1 tablet (40 mg total) by mouth 2 (two) times daily.   primidone (MYSOLINE) 50 MG tablet Take 1 tablet (50 mg total) by mouth 2 (two) times daily.   sacubitril-valsartan (ENTRESTO) 24-26 MG Take 1 tablet by mouth 2 (two) times daily.   topiramate (TOPAMAX) 50 MG tablet Take 1 tablet by mouth 2 (two) times daily.   Current Facility-Administered Medications for the 12/30/21 encounter (Office Visit) with Royce Sciara, Roberts Gaudy, MD  Medication   0.9 %  sodium chloride infusion     Allergies:   Sulfa antibiotics, Multihance [gadobenate], and Tetanus toxoids   Social History   Socioeconomic History   Marital status: Married    Spouse name: Not on file   Number of children: 2   Years of education: Not on file   Highest education level: Not on file  Occupational History   Occupation: brick layer  Tobacco Use   Smoking status: Former    Packs/day: 1.00    Years: 18.00    Total pack years: 18.00    Types: Cigarettes    Start date: 02/09/1964    Quit date: 05/23/1982  Years since quitting: 39.6   Smokeless tobacco: Never   Tobacco comments:    Also smoked 4-5 cigars for 10 years after cigarettes  Vaping Use   Vaping Use: Former  Substance and Sexual Activity   Alcohol use: Yes    Alcohol/week: 0.0 standard drinks of alcohol    Comment: 1 glass wine a month   Drug use: No   Sexual activity: Not on file  Other Topics Concern   Not on file  Social History Narrative   Originally from [place]. Has lived in [place], [place]. Previously has traveled to [place], [place], [place], [place], & on [place] cruises. Currently works as a Designer, fashion/clothing. No pets currently. Remote bird exposure. No mold or hot tub exposure. No known asbestos exposure.    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 There is no history of Lung disease or Esophageal cancer.  ROS:   Please see the history of present illness.    All other systems reviewed and are negative.  EKGs/Labs/Other Studies Reviewed Today:     12/30/2021 Monitor: Sinus rhythm, 5 beats of NSVT  EKG:  Last EKG results: today - SR with LBBB   Recent Labs: 06/17/2021: ALT 142; BUN 22; Creatinine, Ser 0.96; Potassium 3.9; Sodium 139     Physical Exam:    VS:  BP 110/62   Pulse 70   Ht 5\' 6"  (1.676 m)   Wt 194 lb 3.2 oz (88.1 kg)   SpO2 97%   BMI 31.34 kg/m     Wt Readings from Last 3 Encounters:  12/30/21 194 lb 3.2 oz (88.1 kg)  12/23/21 194 lb (88 kg)  07/10/21 208 lb (94.3 kg)     GEN: Well nourished, well developed in no acute distress CARDIAC: RRR, no murmurs, rubs, gallops RESPIRATORY:  Normal work of breathing MUSCULOSKELETAL: no edema    ASSESSMENT & PLAN:    Recurrent syncope: events are rare and unlikely to be captured by another a wearable monitor. He is at risk for arrhythmia with LBBB and with depressed EF and CAD. Will place loop recorder. I described the logistics and risks of the procedure. I explained that there is a monthly charge for monitoring. CAD: s/p PCI in 2018 and 2020 Cardiomyopathy: ischemic. Mildly reduced EF. On Entresto by Dr. 2021      LOOP RECORDER IMPLANT    DESCRIPTION OF PROCEDURE:  Informed written consent was obtained.  The patient required no sedation for the procedure today.  The patients left chest was  prepped and draped in the usual sterile fashion. The skin overlying the left parasternal region was infiltrated with lidocaine for local analgesia.  A 0.5-cm incision was made over the left parasternal region over the 3rd intercostal space.  An Abbott implantable loop recorder was then placed into the pocket  R waves were very prominent and measured >0.83mV.  Steri- Strips and a sterile dressing were then applied.  There were no early apparent complications.  CONCLUSIONS:   1. Successful implantation of an abbott implantable loop recorder (SN [id number]) for a history of cryptogenic stroke  2. No early apparent complications.   Maurice Small, MD  Cardiac Electrophysiology    Medication Adjustments/Labs and Tests Ordered: Current medicines are reviewed at length with the patient today.  Concerns regarding medicines are outlined above.  Orders Placed This Encounter  Procedures   EKG 12-Lead   No orders of the defined types were placed in this encounter.    Signed, Maurice Small, MD  12/30/2021 10:05 AM    [facility]

## 2021-12-30 NOTE — Patient Instructions (Signed)
Medication Instructions:  Your physician recommends that you continue on your current medications as directed. Please refer to the Current Medication list given to you today.  Labwork: None ordered.  Testing/Procedures: None ordered.  Follow-Up:  Your physician wants you to follow-up in: one year with Dr. Mealor.  You will receive a reminder letter in the mail two months in advance. If you don't receive a letter, please call our office to schedule the follow-up appointment.    Implantable Loop Recorder Placement, Care After This sheet gives you information about how to care for yourself after your procedure. Your health care provider may also give you more specific instructions. If you have problems or questions, contact your health care provider. What can I expect after the procedure? After the procedure, it is common to have: Soreness or discomfort near the incision. Some swelling or bruising near the incision.  Follow these instructions at home: Incision care  Monitor your cardiac device site for redness, swelling, and drainage. Call the device clinic at [phone] if you experience these symptoms or fever/chills.  Keep the large square bandage on your site for 24 hours and then you may remove it yourself. Keep the steri-strips underneath in place.   You may shower after 72 hours / 3 days from your procedure with the steri-strips in place. They will usually fall off on their own, or may be removed after 10 days. Pat dry.   Avoid lotions, ointments, or perfumes over your incision until it is well-healed.  Please do not submerge in water until your site is completely healed.   Your device is MRI compatible.   Remote monitoring is used to monitor your cardiac device from home. This monitoring is scheduled every month by our office. It allows us to keep an eye on the function of your device to ensure it is working properly.  If your wound site starts to bleed apply pressure.     For help with the monitor please call [employer] Monitor Support Specialist directly at [phone].    If you have any questions/concerns please call the device clinic at [phone].  Activity  Return to your normal activities.  General instructions Follow instructions from your health care provider about how to manage your implantable loop recorder and transmit the information. Learn how to activate a recording if this is necessary for your type of device. You may go through a metal detection gate, and you may let someone hold a metal detector over your chest. Show your ID card if needed. Do not have an MRI unless you check with your health care provider first. Take over-the-counter and prescription medicines only as told by your health care provider. Keep all follow-up visits as told by your health care provider. This is important. Contact a health care provider if: You have redness, swelling, or pain around your incision. You have a fever. You have pain that is not relieved by your pain medicine. You have triggered your device because of fainting (syncope) or because of a heartbeat that feels like it is racing, slow, fluttering, or skipping (palpitations). Get help right away if you have: Chest pain. Difficulty breathing. Summary After the procedure, it is common to have soreness or discomfort near the incision. Change your dressing as told by your health care provider. Follow instructions from your health care provider about how to manage your implantable loop recorder and transmit the information. Keep all follow-up visits as told by your health care provider. This is important. This information   is not intended to replace advice given to you by your health care provider. Make sure you discuss any questions you have with your health care provider. Document Released: 12/11/2014 Document Revised: 02/14/2017 Document Reviewed: 02/14/2017 Elsevier Patient Education  2020 Elsevier  Inc.  

## 2022-01-20 ENCOUNTER — Other Ambulatory Visit: Payer: Self-pay

## 2022-01-20 MED ORDER — SACUBITRIL-VALSARTAN 24-26 MG PO TABS: 1.0000 | ORAL_TABLET | Freq: Two times a day (BID) | ORAL | 3 refills | Status: DC

## 2022-01-31 ENCOUNTER — Ambulatory Visit: Payer: Medicare Other | Attending: Cardiovascular Disease

## 2022-02-21 LAB — CUP PACEART REMOTE DEVICE CHECK
Date Time Interrogation Session: 20240119020221
Implantable Pulse Generator Implant Date: 20231218
Pulse Gen Serial Number: 511020630

## 2022-02-24 ENCOUNTER — Other Ambulatory Visit: Payer: Self-pay

## 2022-02-24 MED ORDER — CLOPIDOGREL BISULFATE 75 MG PO TABS: 75.0000 mg | ORAL_TABLET | Freq: Every day | ORAL | 3 refills | Status: DC

## 2022-03-03 ENCOUNTER — Ambulatory Visit (INDEPENDENT_AMBULATORY_CARE_PROVIDER_SITE_OTHER): Payer: Medicare Other

## 2022-03-03 DIAGNOSIS — R55 Syncope and collapse: Secondary | ICD-10-CM

## 2022-03-03 LAB — CUP PACEART REMOTE DEVICE CHECK
Date Time Interrogation Session: 20240219083041
Implantable Pulse Generator Implant Date: 20231218
Pulse Gen Serial Number: 511020630

## 2022-03-17 ENCOUNTER — Telehealth: Payer: Self-pay

## 2022-03-17 NOTE — Telephone Encounter (Signed)
Requested to pull patient's ILR report for symptom he experienced yesterday.  Dr. Johnsie Cancel requests: Ian [name] has an implantable loop recorder. yesterday he had an episode of confusion - lost track of what he was doing for ~ 30 seconds. Will you see if there are any arrhythmias noted on the loop recorder. he called me last night and after discussing, I think the episode was hypoglycemia but wanted to check the loop recorder results to see if it shows anything. He has an appt with Ian [name] this am at 54 .   ILR report review normal.  No episodes or arrhythmias to correlate with symptom even yesterday (03/16/22) around 12 noon.

## 2022-03-19 ENCOUNTER — Telehealth: Payer: Self-pay | Admitting: *Deleted

## 2022-03-19 NOTE — Telephone Encounter (Signed)
Pt has been scheduled for tele pre op 03/21/22 @ 10:40 due to med hold and date of procedure. Med rec and consent are done.     Patient Consent for Virtual Visit        Ian [name] has provided verbal consent on 03/19/2022 for a virtual visit (video or telephone).   CONSENT FOR VIRTUAL VISIT FOR:  Ian [name]  By participating in this virtual visit I agree to the following:  I hereby voluntarily request, consent and authorize [facility] and its employed or contracted physicians, physician assistants, nurse practitioners or other licensed health care professionals (the Practitioner), to provide me with telemedicine health care services (the "Services") as deemed necessary by the treating Practitioner. I acknowledge and consent to receive the Services by the Practitioner via telemedicine. I understand that the telemedicine visit will involve communicating with the Practitioner through live audiovisual communication technology and the disclosure of certain medical information by electronic transmission. I acknowledge that I have been given the opportunity to request an in-person assessment or other available alternative prior to the telemedicine visit and am voluntarily participating in the telemedicine visit.  I understand that I have the right to withhold or withdraw my consent to the use of telemedicine in the course of my care at any time, without affecting my right to future care or treatment, and that the Practitioner or I may terminate the telemedicine visit at any time. I understand that I have the right to inspect all information obtained and/or recorded in the course of the telemedicine visit and may receive copies of available information for a reasonable fee.  I understand that some of the potential risks of receiving the Services via telemedicine include:  Delay or interruption in medical evaluation due to technological equipment failure or disruption; Information transmitted may not  be sufficient (e.g. poor resolution of images) to allow for appropriate medical decision making by the Practitioner; and/or  In rare instances, security protocols could fail, causing a breach of personal health information.  Furthermore, I acknowledge that it is my responsibility to provide information about my medical history, conditions and care that is complete and accurate to the best of my ability. I acknowledge that Practitioner's advice, recommendations, and/or decision may be based on factors not within their control, such as incomplete or inaccurate data provided by me or distortions of diagnostic images or specimens that may result from electronic transmissions. I understand that the practice of medicine is not an exact science and that Practitioner makes no warranties or guarantees regarding treatment outcomes. I acknowledge that a copy of this consent can be made available to me via my patient portal ([facility]), or I can request a printed copy by calling the office of [facility].    I understand that my insurance will be billed for this visit.   I have read or had this consent read to me. I understand the contents of this consent, which adequately explains the benefits and risks of the Services being provided via telemedicine.  I have been provided ample opportunity to ask questions regarding this consent and the Services and have had my questions answered to my satisfaction. I give my informed consent for the services to be provided through the use of telemedicine in my medical care

## 2022-03-19 NOTE — Telephone Encounter (Signed)
Pt has been scheduled for tele pre op 03/21/22 @ 10:40 due to med hold and date of procedure. Med rec and consent are done.

## 2022-03-19 NOTE — Telephone Encounter (Signed)
   Name: Ian [name]  DOB: May 17, 1945  MRN: [id number]  Primary Cardiologist: Mertie Moores, MD   Preoperative team, please contact this patient and set up a phone call appointment for further preoperative risk assessment. Please obtain consent and complete medication review. Thank you for your help.  I confirm that guidance regarding antiplatelet and oral anticoagulation therapy has been completed and, if necessary, noted below.  Clopidogrel (Plavix) can be held for 5 days prior to his surgery and resumed as soon as possible post op.    Deberah Pelton, NP 03/19/2022, 2:12 PM [place]

## 2022-03-19 NOTE — Telephone Encounter (Signed)
   Pre-operative Risk Assessment    Patient Name: Ian [name]  DOB: [birth year] MRN: [id number]      Request for Surgical Clearance    Procedure:   Lumbar Epidural Injection.  Date of Surgery:  Clearance 03/29/22                                 Surgeon:  Marena Chancy Surgeon's Group or Practice Name:  [facility] Phone number:  [phone] Fax number:  [phone]   Type of Clearance Requested:   - Medical  - Pharmacy:  Hold Clopidogrel (Plavix) 7 days prior.   Type of Anesthesia:  Not Indicated   Additional requests/questions:    Signed, Greer Ee   03/19/2022, 1:32 PM

## 2022-03-20 NOTE — Progress Notes (Signed)
Virtual Visit via Telephone Note   Because of Yitzhak Melman's co-morbid illnesses, he is at least at moderate risk for complications without adequate follow up.  This format is felt to be most appropriate for this patient at this time.  The patient did not have access to video technology/had technical difficulties with video requiring transitioning to audio format only (telephone).  All issues noted in this document were discussed and addressed.  No physical exam could be performed with this format.  Please refer to the patient's chart for his consent to telehealth for [facility].  Evaluation Performed:  Preoperative cardiovascular risk assessment _____________   Date:  03/20/2022   Patient ID:  Ian [name], DOB 25-Jun-1945, MRN [id number] Patient Location:  Home Provider location:   Office  Primary Care Provider:  Haywood Pao, MD Primary Cardiologist:  Ian Moores, MD  Chief Complaint / Patient Profile   77 y.o. y/o male with a h/o CAD with prior stenting 2008 and 2020, HLD, LBBB, syncope who is pending lumbar epidural injection and presents today for telephonic preoperative cardiovascular risk assessment.  History of Present Illness    Ian [name] is a 77 y.o. male who presents via audio/video conferencing for a telehealth visit today.  Pt was last seen in cardiology clinic on 12/30/21 by Dr. Myles Gip for implantable loop recorder and on 12/23/21 by Dr. Acie Fredrickson.  At that time Ian [name] had recent repeat syncopal event and was referred to EP for loop recorder implant.  The patient is now pending procedure as outlined above. Since his last visit, he  denies chest pain, shortness of breath, lower extremity edema, fatigue, palpitations, melena, hematuria, hemoptysis, diaphoresis, weakness, presyncope, syncope, orthopnea, and PND. No syncope since Thanksgiving 2023. He is able to achieve > 4 METS activity without concerning cardiac symptoms.    Past Medical History    Past  Medical History:  Diagnosis Date   Allergic rhinitis    Cataract    Coronary artery disease    Stent x1 2008   GERD (gastroesophageal reflux disease)    Hyperlipidemia    Hypertension    Past Surgical History:  Procedure Laterality Date   ABDOMINAL HERNIA REPAIR     CARDIAC CATHETERIZATION     stent   CORONARY ANGIOPLASTY WITH STENT PLACEMENT  2008   stent x1   ESOPHAGEAL MANOMETRY N/A 02/06/2016   Procedure: ESOPHAGEAL MANOMETRY (EM);  Surgeon: Manus Gunning, MD;  Location: [facility];  Service: Gastroenterology;  Laterality: N/A;   GASTRIC BYPASS  2005   GASTRIC BYPASS  2005   ROTATOR CUFF REPAIR Left 2007   TOTAL KNEE ARTHROPLASTY Bilateral     Allergies  Allergies  Allergen Reactions   Sulfa Antibiotics Other (See Comments)    As child; unknown   Multihance [Gadobenate] Hives    1 singular hive post contrast    Tetanus Toxoids     The horse serum for tetanus    Home Medications    Prior to Admission medications   Medication Sig Start Date End Date Taking? Authorizing Provider  ascorbic acid (VITAMIN C) 500 MG tablet Take 500 mg by mouth 2 (two) times daily.    [provider]  atorvastatin (LIPITOR) 40 MG tablet TAKE 1 TABLET BY MOUTH EVERY DAY 08/20/21   Nahser, Wonda Cheng, MD  Cholecalciferol (VITAMIN D3) 250 MCG (10000 UT) TABS Take 1,000 Units by mouth in the morning and at bedtime.    [provider]  clopidogrel (PLAVIX) 75  MG tablet Take 1 tablet (75 mg total) by mouth daily. 02/24/22   Nahser, Wonda Cheng, MD  CO-ENZYME Q-10 PO Take 200 mg by mouth daily.    [provider]  ferrous sulfate 325 (65 FE) MG tablet Take 325 mg by mouth 2 (two) times daily with a meal. 09/02/17   [provider]  pantoprazole (PROTONIX) 40 MG tablet Take 1 tablet (40 mg total) by mouth 2 (two) times daily. 07/10/21   Armbruster, Carlota Raspberry, MD  primidone (MYSOLINE) 50 MG tablet Take 1 tablet (50 mg total) by mouth 2 (two) times daily. 05/07/21    Tat, Eustace Quail, DO  sacubitril-valsartan (ENTRESTO) 24-26 MG Take 1 tablet by mouth 2 (two) times daily. 01/20/22   Nahser, Wonda Cheng, MD  topiramate (TOPAMAX) 50 MG tablet Take 1 tablet by mouth 2 (two) times daily.    [provider]    Physical Exam    Vital Signs:  [name] does not have vital signs available for review today.  Given telephonic nature of communication, physical exam is limited. AAOx3. NAD. Normal affect.  Speech and respirations are unlabored.  Accessory Clinical Findings    None  Assessment & Plan    1.  Preoperative Cardiovascular Risk Assessment:The patient is doing well from a cardiac perspective. Therefore, based on ACC/AHA guidelines, the patient would be at acceptable risk for the planned procedure without further cardiovascular testing. According to the Revised Cardiac Risk Index (RCRI), his Perioperative Risk of Major Cardiac Event is (%): 0.9. His Functional Capacity in METs is: 6.05 according to the Duke Activity Status Index (DASI).  The patient was advised that if he develops new symptoms prior to surgery to contact our office to arrange for a follow-up visit, and he verbalized understanding.  Per office protocol, he may hold Plavix for 5 days prior to procedure and should resume as soon as hemodynamically stable postoperatively.   (A copy of this note will be routed to requesting surgeon.  Time:   Today, I have spent 8 minutes with the patient with telehealth technology discussing medical history, symptoms, and management plan.     Emmaline Life, NP-C  03/20/2022, 10:47 AM [address] Office [phone] Fax [phone]

## 2022-03-21 ENCOUNTER — Ambulatory Visit: Payer: Medicare Other | Attending: Internal Medicine | Admitting: Nurse Practitioner

## 2022-03-21 ENCOUNTER — Encounter: Payer: Self-pay | Admitting: Nurse Practitioner

## 2022-03-21 DIAGNOSIS — Z01810 Encounter for preprocedural cardiovascular examination: Secondary | ICD-10-CM | POA: Diagnosis not present

## 2022-04-03 ENCOUNTER — Ambulatory Visit (INDEPENDENT_AMBULATORY_CARE_PROVIDER_SITE_OTHER): Payer: Medicare Other

## 2022-04-03 DIAGNOSIS — R55 Syncope and collapse: Secondary | ICD-10-CM | POA: Diagnosis not present

## 2022-04-04 LAB — CUP PACEART REMOTE DEVICE CHECK
Date Time Interrogation Session: 20240321055143
Implantable Pulse Generator Implant Date: 20231218
Pulse Gen Serial Number: 511020630

## 2022-04-16 NOTE — Progress Notes (Signed)
Merlin Loop Recorder 

## 2022-04-23 ENCOUNTER — Telehealth: Payer: Self-pay | Admitting: *Deleted

## 2022-04-23 NOTE — Telephone Encounter (Signed)
   Name: Ian [name]  DOB: Jul 17, 1945  MRN: [id number]  Primary Cardiologist: Kristeen Miss, MD   Preoperative team, please contact this patient and set up a phone call appointment for further preoperative risk assessment. Please obtain consent and complete medication review. Thank you for your help.  I confirm that guidance regarding antiplatelet and oral anticoagulation therapy has been completed and, if necessary, noted below.  Per office protocol, he may hold Plavix for 5 days prior to procedure and should resume as soon as hemodynamically stable postoperatively.    Carlos Levering, NP 04/23/2022, 4:50 PM [facility]

## 2022-04-23 NOTE — Telephone Encounter (Signed)
   Pre-operative Risk Assessment    Patient Name: Ian [name]  DOB: 1946-01-06 MRN: [id number]      Request for Surgical Clearance    Procedure:   LUMBAR EPIDURAL STEROID INJECTION  Date of Surgery:  Clearance 04/29/22                                 Surgeon:  NOT LISTED  Surgeon's Group or Practice Name:  [facility] Phone number:  [phone] Fax number:  [phone] ATTN: Karleen Dolphin   Type of Clearance Requested:   - Medical  - Pharmacy:  Hold Clopidogrel (Plavix) x 5 DAYS    Type of Anesthesia:  Not Indicated   Additional requests/questions:    [name]   04/23/2022, 4:12 PM

## 2022-04-23 NOTE — Telephone Encounter (Signed)
Pt has been scheduled for tele pre op appt 04/24/22 @ 10:40 add on due to med hold and procedure date. Med rec and consent are done.     Patient Consent for Virtual Visit        Ian [name] has provided verbal consent on 04/23/2022 for a virtual visit (video or telephone).   CONSENT FOR VIRTUAL VISIT FOR:  Ian [name]  By participating in this virtual visit I agree to the following:  I hereby voluntarily request, consent and authorize [facility] and its employed or contracted physicians, physician assistants, nurse practitioners or other licensed health care professionals (the Practitioner), to provide me with telemedicine health care services (the "Services") as deemed necessary by the treating Practitioner. I acknowledge and consent to receive the Services by the Practitioner via telemedicine. I understand that the telemedicine visit will involve communicating with the Practitioner through live audiovisual communication technology and the disclosure of certain medical information by electronic transmission. I acknowledge that I have been given the opportunity to request an in-person assessment or other available alternative prior to the telemedicine visit and am voluntarily participating in the telemedicine visit.  I understand that I have the right to withhold or withdraw my consent to the use of telemedicine in the course of my care at any time, without affecting my right to future care or treatment, and that the Practitioner or I may terminate the telemedicine visit at any time. I understand that I have the right to inspect all information obtained and/or recorded in the course of the telemedicine visit and may receive copies of available information for a reasonable fee.  I understand that some of the potential risks of receiving the Services via telemedicine include:  Delay or interruption in medical evaluation due to technological equipment failure or disruption; Information  transmitted may not be sufficient (e.g. poor resolution of images) to allow for appropriate medical decision making by the Practitioner; and/or  In rare instances, security protocols could fail, causing a breach of personal health information.  Furthermore, I acknowledge that it is my responsibility to provide information about my medical history, conditions and care that is complete and accurate to the best of my ability. I acknowledge that Practitioner's advice, recommendations, and/or decision may be based on factors not within their control, such as incomplete or inaccurate data provided by me or distortions of diagnostic images or specimens that may result from electronic transmissions. I understand that the practice of medicine is not an exact science and that Practitioner makes no warranties or guarantees regarding treatment outcomes. I acknowledge that a copy of this consent can be made available to me via my patient portal [facility] MyChart), or I can request a printed copy by calling the office of [facility].    I understand that my insurance will be billed for this visit.   I have read or had this consent read to me. I understand the contents of this consent, which adequately explains the benefits and risks of the Services being provided via telemedicine.  I have been provided ample opportunity to ask questions regarding this consent and the Services and have had my questions answered to my satisfaction. I give my informed consent for the services to be provided through the use of telemedicine in my medical care

## 2022-04-23 NOTE — Telephone Encounter (Signed)
Pt has been scheduled for tele pre op appt 04/24/22 @ 10:40 add on due to med hold and procedure date. Med rec and consent are done.

## 2022-04-23 NOTE — Progress Notes (Unsigned)
Virtual Visit via Telephone Note   Because of Ian [name]'s co-morbid illnesses, he is at least at moderate risk for complications without adequate follow up.  This format is felt to be most appropriate for this patient at this time.  The patient did not have access to video technology/had technical difficulties with video requiring transitioning to audio format only (telephone).  All issues noted in this document were discussed and addressed.  No physical exam could be performed with this format.  Please refer to the patient's chart for his consent to telehealth for [facility].  Evaluation Performed:  Preoperative cardiovascular risk assessment _____________   Date:  04/23/2022   Patient ID:  Ian [name], DOB 11/03/1945, MRN [id number] Patient Location:  Home Provider location:   Office  Primary Care Provider:  Gaspar Garbe, MD Primary Cardiologist:  Ian Miss, MD  Chief Complaint / Patient Profile   77 y.o. y/o male with Ian h/o CAD s/p stent 2018 2020 (in [place], [place]), chronic systolic heart failure, LBBB, hypertension, hyperlipidemia, T2DM, GERD, syncope s/p loop recorder implantation December 2023 who is pending lumbar epidural steroid injection by [facility] and presents today for telephonic preoperative cardiovascular risk assessment.  History of Present Illness    Ian [name] is Ian 77 y.o. male who presents via audio/video conferencing for Ian telehealth visit today.  Pt was last seen in cardiology clinic on 12/23/2021 by Dr. Elease Hashimoto.  At that time Ian [name], Ian [name] experienced Ian syncopal episode.  He underwent loop recorder placement with Dr. Nelly Laurence on 12/30/2021.  The patient is now pending procedure as outlined above. Since his last visit, he continues to do well. Patient denies shortness of breath or dyspnea on exertion. No chest pain, pressure, or tightness. Denies lower extremity edema, orthopnea, or PND. No palpitations. No presyncopal or syncopal episode since  December 2023. He is independent with ADLs and tasks around his home. He stays active by working in his wood shop every day.   Past [name] History    Past [name] History:  Diagnosis Date   Allergic rhinitis    Cataract    Coronary artery disease    Stent x1 2008   GERD (gastroesophageal reflux disease)    Hyperlipidemia    Hypertension    Past Surgical History:  Procedure Laterality Date   ABDOMINAL HERNIA REPAIR     CARDIAC CATHETERIZATION     stent   CORONARY ANGIOPLASTY WITH STENT PLACEMENT  2008   stent x1   ESOPHAGEAL MANOMETRY N/Ian 02/06/2016   Procedure: ESOPHAGEAL MANOMETRY (EM);  Surgeon: Ruffin Frederick, MD;  Location: [facility];  Service: Gastroenterology;  Laterality: N/Ian;   GASTRIC BYPASS  2005   GASTRIC BYPASS  2005   ROTATOR CUFF REPAIR Left 2007   TOTAL KNEE ARTHROPLASTY Bilateral     Allergies  Allergies  Allergen Reactions   Sulfa Antibiotics Other (See Comments)    As child; unknown   Multihance [Gadobenate] Hives    1 singular hive post contrast    Tetanus Toxoids     The horse serum for tetanus    Home Medications    Prior to Admission medications   Medication Sig Start Date End Date Taking? Authorizing Provider  ascorbic acid (VITAMIN C) 500 MG tablet Take 500 mg by mouth 2 (two) times daily.    [provider]  atorvastatin (LIPITOR) 40 MG tablet TAKE 1 TABLET BY MOUTH EVERY DAY 08/20/21   Nahser, Deloris Ping, MD  Cholecalciferol (VITAMIN D3) 250 MCG (  10000 UT) TABS Take 1,000 Units by mouth in the morning and at bedtime.    [provider]  clopidogrel (PLAVIX) 75 MG tablet Take 1 tablet (75 mg total) by mouth daily. 02/24/22   Nahser, Deloris Ping, MD  CO-ENZYME Q-10 PO Take 200 mg by mouth daily.    [provider]  ferrous sulfate 325 (65 FE) MG tablet Take 325 mg by mouth 2 (two) times daily with Ian meal. 09/02/17   [provider]  levETIRAcetam (KEPPRA) 500 MG tablet Take 500 mg by mouth 2 (two) times  daily. 04/04/22   [provider]  pantoprazole (PROTONIX) 40 MG tablet Take 1 tablet (40 mg total) by mouth 2 (two) times daily. 07/10/21   Armbruster, Willaim Rayas, MD  primidone (MYSOLINE) 50 MG tablet Take 1 tablet (50 mg total) by mouth 2 (two) times daily. 05/07/21   Tat, Octaviano Batty, DO  sacubitril-valsartan (ENTRESTO) 24-26 MG Take 1 tablet by mouth 2 (two) times daily. 01/20/22   Nahser, Deloris Ping, MD  topiramate (TOPAMAX) 50 MG tablet Take 1 tablet by mouth 2 (two) times daily.    [provider]    Physical Exam    Vital Signs:  Ian [name] does not have vital signs available for review today.  Given telephonic nature of communication, physical exam is limited. AAOx3. NAD. Normal affect.  Speech and respirations are unlabored.  Accessory Clinical Findings    None  Assessment & Plan    Primary Cardiologist: Ian Miss, MD  Preoperative cardiovascular risk assessment.  Lumbar epidural steroid injection by [facility] on 04/29/2022.  Chart reviewed as part of pre-operative protocol coverage. According to the RCRI, patient has Ian 0.9% risk of MACE. Patient reports activity equivalent to >4.0 METS (working out in [employer] every day).   Given past [name] history and time since last visit, based on ACC/AHA guidelines, [name] would be at acceptable risk for the planned procedure without further cardiovascular testing.   Patient was advised that if he develops new symptoms prior to surgery to contact our office to arrange Ian follow-up appointment.  he verbalized understanding.  Per office protocol, he may hold Plavix for 5 days prior to procedure and should resume as soon as hemodynamically stable postoperatively.   I will route this recommendation to the requesting party via Epic fax function.  Please call with questions.  Time:   Today, I have spent 5 minutes with the patient with telehealth technology discussing [name] history, symptoms, and management plan.      Carlos Levering, NP  04/23/2022, 8:24 PM

## 2022-04-24 ENCOUNTER — Ambulatory Visit: Payer: Medicare Other | Attending: Cardiology | Admitting: Student

## 2022-04-24 DIAGNOSIS — Z01810 Encounter for preprocedural cardiovascular examination: Secondary | ICD-10-CM | POA: Diagnosis not present

## 2022-05-05 ENCOUNTER — Ambulatory Visit (INDEPENDENT_AMBULATORY_CARE_PROVIDER_SITE_OTHER): Payer: Medicare Other

## 2022-05-05 DIAGNOSIS — R55 Syncope and collapse: Secondary | ICD-10-CM | POA: Diagnosis not present

## 2022-05-06 LAB — CUP PACEART REMOTE DEVICE CHECK
Date Time Interrogation Session: 20240422024416
Implantable Pulse Generator Implant Date: 20231218
Pulse Gen Serial Number: 511020630

## 2022-05-07 NOTE — Progress Notes (Signed)
Carelink Summary Report / Loop Recorder 

## 2022-05-15 ENCOUNTER — Telehealth: Payer: Self-pay

## 2022-05-15 ENCOUNTER — Telehealth: Payer: Self-pay | Admitting: Gastroenterology

## 2022-05-15 DIAGNOSIS — K2270 Barrett's esophagus without dysplasia: Secondary | ICD-10-CM

## 2022-05-15 MED ORDER — PANTOPRAZOLE SODIUM 40 MG PO TBEC: 40.0000 mg | DELAYED_RELEASE_TABLET | Freq: Two times a day (BID) | ORAL | 0 refills | Status: DC

## 2022-05-15 NOTE — Telephone Encounter (Signed)
Request made by Dr. Elease Hashimoto to review transmission sent by patient.  He reports having another "episode".   ILR transmission reviewed.  Patient was in SR when triggering symptom.  Do not see anything to correlate with event.  Patient aware, states he passed out at the time of symptom.  Wife present.  This is a pattern for patient and he states he is also following neurology on this and will touch base with them.  He has ER precautions.   FYI to Dr. Nelly Laurence and Dr. Elease Hashimoto.

## 2022-05-15 NOTE — Telephone Encounter (Signed)
Refill of Protonix sent to pharmacy

## 2022-05-15 NOTE — Telephone Encounter (Signed)
Patient called stating he needs a refill for his protonix 40 mg medication. He was last seen when he had his endoscopy 06/2021. States he only has a week left of the medication. I have scheduled him for a office visit, but is requesting to have it filled before then to the [facility] on [address]. Please advise. Thank you.

## 2022-06-04 ENCOUNTER — Other Ambulatory Visit: Payer: Self-pay

## 2022-06-04 MED ORDER — ATORVASTATIN CALCIUM 40 MG PO TABS: 40.0000 mg | ORAL_TABLET | Freq: Every day | ORAL | 3 refills | Status: DC

## 2022-06-05 ENCOUNTER — Ambulatory Visit (INDEPENDENT_AMBULATORY_CARE_PROVIDER_SITE_OTHER): Payer: Medicare Other

## 2022-06-05 DIAGNOSIS — R55 Syncope and collapse: Secondary | ICD-10-CM | POA: Diagnosis not present

## 2022-06-05 LAB — CUP PACEART REMOTE DEVICE CHECK
Date Time Interrogation Session: 20240523020209
Implantable Pulse Generator Implant Date: 20231218
Pulse Gen Serial Number: 511020630

## 2022-06-11 NOTE — Progress Notes (Signed)
Merlin Loop Recorder 

## 2022-06-23 NOTE — Progress Notes (Unsigned)
Cardiology Office Note:    Date:  06/25/2022   ID:  Ian [name], DOB [birth year], MRN [id number]  PCP:  Gaspar Garbe, MD  Cardiologist:  Kristeen Miss, MD  Electrophysiologist:  Maurice Small, MD   Referring MD:  Tisovec   Problem list 1.  Coronary artery disease 2.  Hyperlipidemia 3.  Hypertension 4.  Left bundle branch block 5.  Hyperlipidemia 6. Gastric bypass - ~2005 7  Anemia ( iron def. B12 deficiency )   No chief complaint on file.       Ian [name] is a 77 y.o. male with a hx of   CAD, HTN, LBBB Just moved from [place].  Seen with wife Ian [name].  Ian [name] is seen today for the first time.  He has a history of coronary artery disease.  He has a history of stenting in August, 2019 in [place]. He is felt well following the stents. Recent  ( Sept. 2017, 2019) total cholesterol level is 90.  The HDL is 35.  The LDL is 46.  The triglyceride level is 44.  Retired Presenter, broadcasting / Surveyor, minerals  Works in his [employer] now  No real exercise   OCT. 27, 2020: Ian [name] is seen today for follow-up of his coronary artery disease.  He has a history of hyperlipidemia.  He has a stable LBBB.  Busy in his wood shop Has been diagnosed with essential tremor ( is on Primidone)  We discussed drug interactions with his multiple cardiac meds.  No CP since his stenting several years ago   Jan. 31, 2022: Ian [name] is seen today for follow up of his CAD, HLD. LBBB  Hx of coronary stenting in 2019.  Still doing lots of woodworking .    He told me about a new table saw - the sawstop  Is very active in his shop but no real cardio exercise   June 29, 2020: Ian [name] is seen today for follow up of his CAD and LBBB, HTN Echo on Feb. 18, 2022 shows mildly reduced LV EF (40-45%) and he was started on Entresto 24-26 BID He has mild AS Is losing some weight  Is on Primidone and topamax  Has lost 20 pounds .  Dec. 6, 2022 Ian [name] is seen today for follow up of his CAD, LBBB, HTN Echo in Feb. 2022  sows EF 40-45%. Was started on Entresto 24-26 BID   He had an episode of orthostatic hypotension while checking her wife's tires .  Had lost 35-40 lbs .  CT of the head was normal,  MRI of brain was normal  Was seen by Dr. Wylene Simmer.  Coreg was reduced to 3.125 BID  Feels better , no further episodes of syncope  He check his bp regularly ,    January 28, 2021: Ian [name] is seen today for a work in visit.  He was doing some woodworking last week and developed an episode of unresponsiveness.  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  His daughter was with him out in the shop and stated that he came to approximately a minute or so later.  Following that he felt fine.  There was no postictal symptoms.  There is no loss of  consciousness.  Has some vague right sided chest pain  - not similar to his angina. Not pleuretic  Has bee present for several weeks   Had a similar episode while putting air in his wifes  tires.  Sound like a TIA  Is on plavix   June 18, 2021; Ian [name] is seen today for follow up of his CAD, CHF, LBBB He has had some unusual neurologic issues ( near syncope / loss of awareness)  Has been evaluated by neuro - EEG was negative 14 day monitor did not reveal any significant arrhythmias.  Labs were reviewed ALT is 142. Lipids:  Chol is 113 HDL - 56 Trig - 49 LDL - 45   His tremor has generally worsened.  Wants to restart topamax  Dr. Arbutus Leas is concerned about kidney stones He has only had kidney stones on one occasion .   He is willing to take the risk of renal stones if he can get neuro to prescribe it again . He has taken it in the past with no issues.   Tolerating the Entresto well No orthostasis   Having difficulty swallowing  - will need to see GI at some point  Having R hip pain and R leg numbness on occasion ( after lots of exercise / stairs )   [facility].   May need a MRI   Dec. 11,  2023  Ian [name] is seen for follow up of his CAD, stenting  ( 2018, 2020) CHF,  Had another episode of syncope 2 weeks ago . While blowing leaves .  Larey Seat on his arm and shoulder  May have had some mild presyncope prior to the  No CP , breathing is ok   June 25, 2022 Ian [name] is seen for follow up of his CAD, CHF, syncope  No further episodes of his syncope  No CP , no dyspnea Not much exercise  Wt is 184  Is fatigued often Likes to nap Will check a CBC and TSH        Past Medical History:  Diagnosis Date   Allergic rhinitis    Cataract    Coronary artery disease    Stent x1 2008   GERD (gastroesophageal reflux disease)    Hyperlipidemia    Hypertension     Past Surgical History:  Procedure Laterality Date   ABDOMINAL HERNIA REPAIR     CARDIAC CATHETERIZATION     stent   CORONARY ANGIOPLASTY WITH STENT PLACEMENT  2008   stent x1   ESOPHAGEAL MANOMETRY N/A 02/06/2016   Procedure: ESOPHAGEAL MANOMETRY (EM);  Surgeon: Ruffin Frederick, MD;  Location: [facility];  Service: Gastroenterology;  Laterality: N/A;   GASTRIC BYPASS  2005   GASTRIC BYPASS  2005   ROTATOR CUFF REPAIR Left 2007   TOTAL KNEE ARTHROPLASTY Bilateral     Current Medications: Current Meds  Medication Sig   ascorbic acid (VITAMIN C) 500 MG tablet Take 500 mg by mouth 2 (two) times daily.   atorvastatin (LIPITOR) 40 MG tablet Take 1 tablet (40 mg total) by mouth daily.   carvedilol (COREG) 3.125 MG tablet Take 1 tablet (3.125 mg total) by mouth 2 (two) times daily with a meal.   Cholecalciferol (VITAMIN D3) 250 MCG (10000 UT) TABS Take 1,000 Units by mouth in the morning and at bedtime.   clopidogrel (PLAVIX) 75 MG tablet Take 1 tablet (75 mg total) by mouth daily.   CO-ENZYME Q-10 PO Take 200 mg by mouth daily.   ferrous sulfate 325 (65 FE) MG tablet Take 325 mg by mouth daily.   levETIRAcetam (KEPPRA) 500 MG tablet Take 750 mg by mouth 2 (two) times daily.   pantoprazole (PROTONIX) 40 MG  tablet  Take 1 tablet (40 mg total) by mouth 2 (two) times daily.   primidone (MYSOLINE) 50 MG tablet Take 1 tablet (50 mg total) by mouth 2 (two) times daily.   sacubitril-valsartan (ENTRESTO) 24-26 MG Take 1 tablet by mouth 2 (two) times daily.   topiramate (TOPAMAX) 50 MG tablet Take 1 tablet by mouth 2 (two) times daily.   Current Facility-Administered Medications for the 06/25/22 encounter (Office Visit) with Sonu Kruckenberg, Deloris Ping, MD  Medication   0.9 %  sodium chloride infusion     Allergies:   Sulfa antibiotics, Multihance [gadobenate], and Tetanus toxoids   Social History   Socioeconomic History   Marital status: Married    Spouse name: Not on file   Number of children: 2   Years of education: Not on file   Highest education level: Not on file  Occupational History   Occupation: brick layer  Tobacco Use   Smoking status: Former    Packs/day: 1.00    Years: 18.00    Additional pack years: 0.00    Total pack years: 18.00    Types: Cigarettes    Start date: 02/09/1964    Quit date: 05/23/1982    Years since quitting: 40.1   Smokeless tobacco: Never   Tobacco comments:    Also smoked 4-5 cigars for 10 years after cigarettes  Vaping Use   Vaping Use: Former  Substance and Sexual Activity   Alcohol use: Yes    Alcohol/week: 0.0 standard drinks of alcohol    Comment: 1 glass wine a month   Drug use: No   Sexual activity: Not on file  Other Topics Concern   Not on file  Social History Narrative   Originally from [place]. Has lived in [place], [place]. Previously has traveled to [place], [place], [place], [place], & on [place] cruises. Currently works as a Designer, fashion/clothing. No pets currently. Remote bird exposure. No mold or hot tub exposure. No known asbestos exposure.    Right handed   Lives with wife   Currently retired    International aid/development worker of Corporate investment banker Strain: Not on file  Food Insecurity: Not on file  Transportation Needs: Not on file  Physical  Activity: Not on file  Stress: Not on file  Social Connections: Not on file     Family History: The patient's family history includes Breast cancer in his mother and sister; Colon cancer (age of onset: 47) in his father; Diabetes in his mother and sister; Heart disease in his father and mother; Prostate cancer in his father. There is no history of Lung disease or Esophageal cancer.  ROS:   Please see the history of present illness.     All other systems reviewed and are negative.  EKGs/Labs/Other Studies Reviewed:    The following studies were reviewed today:    Recent Labs: No results found for requested labs within last 365 days.  Recent Lipid Panel    Component Value Date/Time   CHOL 113 06/17/2021 0741   TRIG 49 06/17/2021 0741   HDL 56 06/17/2021 0741   CHOLHDL 2.0 06/17/2021 0741   LDLCALC 45 06/17/2021 0741    Physical Exam: Blood pressure 94/68, pulse (!) 56, height 5\' 6"  (1.676 m), weight 184 lb 12.8 oz (83.8 kg), SpO2 95 %.       GEN:  Well nourished, well developed in no acute distress HEENT: Normal NECK: No JVD; No carotid bruits LYMPHATICS: No lymphadenopathy CARDIAC: RRR , soft systolic murmur  RESPIRATORY:  Clear to auscultation without rales, wheezing or rhonchi  ABDOMEN: Soft, non-tender, non-distended MUSCULOSKELETAL:  No edema; No deformity  SKIN: Warm and dry NEUROLOGIC:  Alert and oriented x 3    ECG:       ASSESSMENT:    1. LBBB (left bundle branch block)   2. Chronic combined systolic and diastolic heart failure (HCC)   3. Coronary artery disease involving native coronary artery of native heart without angina pectoris   4. Fatigue, unspecified type          PLAN:       Syncope : He is only had 1 episode of his near syncope/syncope.  This occurred while he was watching TV in bed.  He is having repeat EEG this week.  We interrogated his implantable loop recorder and it did not show any arrhythmias.  He is having some  discomfort from his implantable loop.  As soon as we decide that these episodes are not due to a cardiac etiology, he would like to have it taken out.      2.  coronary artery disease:    Lipid levels have been good.  He is not having any angina.     3.    Hyperlipidemia: Levels from last year look great.  Will recheck lipids, ALT, basic metabolic profile today.   4.  Chronic systolic congestive heart failure: Continue Entresto 24-26 twice a day.  Continue carvedilol 3.125 mg twice a day.  Blood pressure and heart rate are borderline low.  I have asked him to let me know if he has any episodes of orthostatic hypotension.  I would discontinue the carvedilol at that time.    5.  Elevated ALT:      Medication Adjustments/Labs and Tests Ordered: Current medicines are reviewed at length with the patient today.  Concerns regarding medicines are outlined above.  Orders Placed This Encounter  Procedures   Basic metabolic panel   CBC   Lipid panel   TSH   ALT     Meds ordered this encounter  Medications   carvedilol (COREG) 3.125 MG tablet    Sig: Take 1 tablet (3.125 mg total) by mouth 2 (two) times daily with a meal.    Dispense:  180 tablet    Refill:  0       Patient Instructions  Medication Instructions:  Your physician recommends that you continue on your current medications as directed. Please refer to the Current Medication list given to you today.  *If you need a refill on your cardiac medications before your next appointment, please call your pharmacy*   Lab Work: Lipids, ALT, BMET, CBC, TSH today  If you have labs (blood work) drawn today and your tests are completely normal, you will receive your results only by: MyChart Message (if you have MyChart) OR A paper copy in the mail If you have any lab test that is abnormal or we need to change your treatment, we will call you to review the results.   Testing/Procedures: None ordered   Follow-Up: At [facility], you and your health needs are our priority.  As part of our continuing mission to provide you with exceptional heart care, we have created designated Provider Care Teams.  These Care Teams include your primary Cardiologist (physician) and Advanced Practice Providers (APPs -  Physician Assistants and Nurse Practitioners) who all work together to provide you with the care you need, when you need it.  We recommend signing up  for the patient portal called "MyChart".  Sign up information is provided on this After Visit Summary.  MyChart is used to connect with patients for Virtual Visits (Telemedicine).  Patients are able to view lab/test results, encounter notes, upcoming appointments, etc.  Non-urgent messages can be sent to your provider as well.   To learn more about what you can do with MyChart, go to [email or web address].    Your next appointment:   6 month(s)  Provider:   Kristeen Miss, MD    Signed, Kristeen Miss, MD  06/25/2022 6:05 PM    [facility]

## 2022-06-25 ENCOUNTER — Ambulatory Visit: Payer: Medicare Other | Attending: Cardiovascular Disease | Admitting: Cardiovascular Disease

## 2022-06-25 ENCOUNTER — Encounter: Payer: Self-pay | Admitting: Cardiovascular Disease

## 2022-06-25 VITALS — BP 94/68 | HR 56 | Ht 66.0 in | Wt 184.8 lb

## 2022-06-25 DIAGNOSIS — R5383 Other fatigue: Secondary | ICD-10-CM

## 2022-06-25 DIAGNOSIS — I5042 Chronic combined systolic (congestive) and diastolic (congestive) heart failure: Secondary | ICD-10-CM | POA: Diagnosis not present

## 2022-06-25 DIAGNOSIS — I2510 Atherosclerotic heart disease of native coronary artery without angina pectoris: Secondary | ICD-10-CM

## 2022-06-25 DIAGNOSIS — I447 Left bundle-branch block, unspecified: Secondary | ICD-10-CM | POA: Diagnosis not present

## 2022-06-25 MED ORDER — CARVEDILOL 3.125 MG PO TABS
3.1250 mg | ORAL_TABLET | Freq: Two times a day (BID) | ORAL | 0 refills | Status: DC
Start: 2022-06-25 — End: 2022-08-20

## 2022-06-25 NOTE — Progress Notes (Signed)
Merlin Loop Recorder 

## 2022-06-25 NOTE — Patient Instructions (Signed)
Medication Instructions:  Your physician recommends that you continue on your current medications as directed. Please refer to the Current Medication list given to you today.  *If you need a refill on your cardiac medications before your next appointment, please call your pharmacy*   Lab Work: Lipids, ALT, BMET, CBC, TSH today  If you have labs (blood work) drawn today and your tests are completely normal, you will receive your results only by: MyChart Message (if you have MyChart) OR A paper copy in the mail If you have any lab test that is abnormal or we need to change your treatment, we will call you to review the results.   Testing/Procedures: None ordered   Follow-Up: At [facility], you and your health needs are our priority.  As part of our continuing mission to provide you with exceptional heart care, we have created designated Provider Care Teams.  These Care Teams include your primary Cardiologist (physician) and Advanced Practice Providers (APPs -  Physician Assistants and Nurse Practitioners) who all work together to provide you with the care you need, when you need it.  We recommend signing up for the patient portal called "MyChart".  Sign up information is provided on this After Visit Summary.  MyChart is used to connect with patients for Virtual Visits (Telemedicine).  Patients are able to view lab/test results, encounter notes, upcoming appointments, etc.  Non-urgent messages can be sent to your provider as well.   To learn more about what you can do with MyChart, go to [email or web address].    Your next appointment:   6 month(s)  Provider:   Kristeen Miss, MD

## 2022-06-26 LAB — BASIC METABOLIC PANEL
BUN/Creatinine Ratio: 20 (ref 10–24)
BUN: 18 mg/dL (ref 8–27)
CO2: 24 mmol/L (ref 20–29)
Calcium: 8.9 mg/dL (ref 8.6–10.2)
Chloride: 109 mmol/L — ABNORMAL HIGH (ref 96–106)
Creatinine, Ser: 0.88 mg/dL (ref 0.76–1.27)
Glucose: 102 mg/dL — ABNORMAL HIGH (ref 70–99)
Potassium: 3.7 mmol/L (ref 3.5–5.2)
Sodium: 143 mmol/L (ref 134–144)
eGFR: 87 mL/min/{1.73_m2} (ref >59)

## 2022-06-26 LAB — ALT: ALT: 39 IU/L (ref 0–44)

## 2022-06-26 LAB — CBC
Hematocrit: 42.6 % (ref 37.5–51.0)
Hemoglobin: 14 g/dL (ref 13.0–17.7)
MCH: 31.4 pg (ref 26.6–33.0)
MCHC: 32.9 g/dL (ref 31.5–35.7)
MCV: 96 fL (ref 79–97)
Platelets: 193 10*3/uL (ref 150–450)
RBC: 4.46 x10E6/uL (ref 4.14–5.80)
RDW: 12.5 % (ref 11.6–15.4)
WBC: 5.3 10*3/uL (ref 3.4–10.8)

## 2022-06-26 LAB — LIPID PANEL
Chol/HDL Ratio: 2.3 ratio (ref 0.0–5.0)
Cholesterol, Total: 103 mg/dL (ref 100–199)
HDL: 44 mg/dL (ref >39)
LDL Chol Calc (NIH): 49 mg/dL (ref 0–99)
Triglycerides: 36 mg/dL (ref 0–149)
VLDL Cholesterol Cal: 10 mg/dL (ref 5–40)

## 2022-06-26 LAB — TSH: TSH: 2.34 u[IU]/mL (ref 0.450–4.500)

## 2022-07-07 ENCOUNTER — Ambulatory Visit: Payer: Medicare Other

## 2022-07-07 DIAGNOSIS — R55 Syncope and collapse: Secondary | ICD-10-CM | POA: Diagnosis not present

## 2022-07-08 LAB — CUP PACEART REMOTE DEVICE CHECK
Date Time Interrogation Session: 20240624085916
Implantable Pulse Generator Implant Date: 20231218
Pulse Gen Serial Number: 511020630

## 2022-07-21 ENCOUNTER — Other Ambulatory Visit: Payer: Self-pay | Admitting: Nurse Practitioner

## 2022-07-21 ENCOUNTER — Ambulatory Visit: Payer: Medicare Other | Admitting: Nurse Practitioner

## 2022-07-21 ENCOUNTER — Encounter: Payer: Self-pay | Admitting: Nurse Practitioner

## 2022-07-21 VITALS — BP 90/58 | HR 64 | Ht 63.0 in | Wt 183.0 lb

## 2022-07-21 DIAGNOSIS — R1319 Other dysphagia: Secondary | ICD-10-CM

## 2022-07-21 DIAGNOSIS — K2270 Barrett's esophagus without dysplasia: Secondary | ICD-10-CM | POA: Diagnosis not present

## 2022-07-21 MED ORDER — PANTOPRAZOLE SODIUM 40 MG PO TBEC
40.0000 mg | DELAYED_RELEASE_TABLET | Freq: Two times a day (BID) | ORAL | 3 refills | Status: DC
Start: 2022-07-21 — End: 2022-08-15

## 2022-07-21 MED ORDER — FAMOTIDINE 20 MG PO TABS: 20.0000 mg | ORAL_TABLET | Freq: Every day | ORAL | 1 refills | Status: DC

## 2022-07-21 NOTE — Patient Instructions (Signed)
You have been scheduled for a Barium Esophogram at [facility] (1st floor of the hospital) on 07/31/22 at 2:00 pm. Please arrive 15 minutes prior to your appointment for registration. If you need to reschedule for any reason, please contact radiology at [phone] to do so. _____________________________________________________  Due to recent changes in healthcare laws, you may see the results of your imaging and laboratory studies on MyChart before your provider has had a chance to review them.  We understand that in some cases there may be results that are confusing or concerning to you. Not all laboratory results come back in the same time frame and the provider may be waiting for multiple results in order to interpret others.  Please give [place] 48 hours in order for your provider to thoroughly review all the results before contacting the office for clarification of your results.   Thank you for trusting me with your gastrointestinal care!   Alcide Evener, CRNP

## 2022-07-21 NOTE — Progress Notes (Signed)
Agree with assessment and plan as outlined. 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 We can do a barium swallow to get gross sense of dysmotility. Best test to clarify how significant this is and see if any development of achalasia would be with manometry, to see if anything we can intervene upon. Okay to start with barium swallow. I do not think he needs another colonoscopy, would not be due for another 2 years. Given comorbidities and age, risks likely outweigh benefits.

## 2022-07-21 NOTE — Progress Notes (Signed)
07/21/2022 Ian [name] [id number] 05-28-1945   Chief Complaint:  Medication refill, increased heartburn   History of Present Illness: Ian [name] is a 77 year old male with a past medical history of hypertension, hyperlipidemia, coronary artery disease s/p stent placement in [place] 08/2017, chronic systolic CHF on Entresto, left BBB, IDA, GERD, Barrett's esophagus, essential tremor and syncope. S/P Roux-en-Y gastric bypass surgery 2005. He is known by Dr. Adela Lank. He presents today for further evaluation regarding burning discomfort to his upper esophagus which can be triggered by drinking water or eating food which occurs daily for the past few months. He is accompanied by his wife. No specific food triggers. He also describes feeling as if food sits in his upper esophagus which is chronic is ongoing for a few weeks then dissipates forward a week or two, which is not any better or worse since he underwent an EGD 06/2021.  At that time, his EGD identified a small hiatal hernia, Barrett's esophagus, esophagus was tortuous and dilated with an angulated turn at the GE junction consistent with underlying aperistalsis. Esophageal manometry 01/2016 showed evidence of a peristalsis without features of achalasia. He was previously referred to rheumatology to rule out a connective tissue disorder associated with aperistalsis which was unrevealing. No nausea, vomiting or regurgitation. No abdominal pain. He remains on Pantoprazole 40 mg twice daily. He is passing normal formed brown bowel movement daily.  No rectal bleeding or black stools. His most recent colonoscopy was 04/20/2014 which showed diverticulosis to the sigmoid colon and internal hemorrhoids. Father with history of colon cancer at the age of 62.  History of syncope/presyncope since 01/2021.  EEG was negative, on Keppra, followed by neurology.  Underwent cardiac evaluation with implantable loop recorder which did not show any arrhythmia.       Latest Ref Rng & Units 06/25/2022    9:05 AM 10/10/2020    5:57 PM 08/21/2015   12:53 PM  CBC  WBC 3.4 - 10.8 x10E3/uL 5.3  7.3  8.4   Hemoglobin 13.0 - 17.7 g/dL 40.9  81.1  91.4   Hematocrit 37.5 - 51.0 % 42.6  44.1  33.7   Platelets 150 - 450 x10E3/uL 193  213  267.0        Latest Ref Rng & Units 06/25/2022    9:05 AM 06/17/2021    7:41 AM 10/10/2020    5:57 PM  CMP  Glucose 70 - 99 mg/dL 782  76  65   BUN 8 - 27 mg/dL 18  22  17    Creatinine 0.76 - 1.27 mg/dL 9.56  2.13  0.86   Sodium 134 - 144 mmol/L 143  139  139   Potassium 3.5 - 5.2 mmol/L 3.7  3.9  4.2   Chloride 96 - 106 mmol/L 109  100  112   CO2 20 - 29 mmol/L 24  25  21    Calcium 8.6 - 10.2 mg/dL 8.9  9.0  8.4   Total Protein 6.5 - 8.1 g/dL   5.5   Total Bilirubin 0.3 - 1.2 mg/dL   0.7   Alkaline Phos 38 - 126 U/L   70   AST 15 - 41 U/L   40   ALT 0 - 44 IU/L 39  142  37     ECHO 12/16/2021: IMPRESSIONS Left ventricular ejection fraction, by estimation, is 45 to 50%. The left ventricle has mildly decreased function. The left ventricle demonstrates regional wall motion abnormalities (see scoring diagram/findings  for description). There is hypokinesis of the basal-to-apical inferoseptal LV segments and the basal-to-mid inferior LV segments. Septal motion is paradoxical due to the presence of LBBB. There is mild concentric left ventricular hypertrophy. Left ventricular diastolic parameters are consistent with Grade I diastolic dysfunction (impaired relaxation). 1. 2. Right ventricular systolic function is normal. The right ventricular size is normal. 3. The mitral valve is abnormal. Trivial mitral valve regurgitation. The aortic valve is tricuspid. There is moderate calcification of the aortic valve. There is moderate thickening of the aortic valve. Aortic valve regurgitation is trivial. Mild aortic valve stenosis. Aortic valve mean gradient measures 13.0 mmHg. AVA by continuity 1.0cm2, DI 0.32. 4. Aortic dilatation noted. There is  mild dilatation of the ascending aorta, measuring 40 mm  PAST GI PROCEDURES:  EGD 07/10/2021: - Small hiatal hernia.  - Barrett's esophagus as outlined. Biopsied.  - Dilated / tortous esophagus with angulated turn at the GEJ which pooled secretions, consistent with underlying aperistalsis.  - Roux-en-Y gastrojejunostomy with gastrojejunal anastomosis characterized by healthy appearing mucosa.  - Normal examined small bowel limb. - BARRETT'S ESOPHAGUS, NEGATIVE FOR DYSPLASIA  Colonoscopy 04/20/2014: Diverticulosis sigmoid colon Internal hemorrhoids   PAST IMAGE STUDIES:  Esophageal manometry 02/02/2016: Normal relaxation of the EG junction A peristalsis concerning for possible scleroderma esophagus or mixed connective tissue disorder Findings are not consistent with achalasia  Barium swallow study 06/15/2015: 1. Abnormal markedly dilated and fixed esophagus with absent esophageal contractions/motility. 2. Associated gastroesophageal reflux. 3. Some associated mass effect from the proximal dilated esophagus on the lower cervical esophagus with concentric narrowing at the junction of the cervical and thoracic esophagus, however, at no functional obstruction noted, and the barium tablet passed without difficulty. 4. Superimposed changes of gastric bypass/gastrojejunostomy. 5. The findings were reviewed at the conclusion of this study with the patient. This esophageal abnormality would seem to be very longstanding, and it is unclear whether this is related to the patient cough symptoms. As far as the underlying etiology, the top differential considerations would include connective tissue disease and sequelae of chronic severe esophageal spasm.  Past Medical History:  Diagnosis Date   Allergic rhinitis    Cataract    Coronary artery disease    Stent x1 2008   GERD (gastroesophageal reflux disease)    Hyperlipidemia    Hypertension    Syncope    on Keppra   Past Surgical  History:  Procedure Laterality Date   ABDOMINAL HERNIA REPAIR     CARDIAC CATHETERIZATION     stent   CORONARY ANGIOPLASTY WITH STENT PLACEMENT  2008   stent x1   ESOPHAGEAL MANOMETRY N/A 02/06/2016   Procedure: ESOPHAGEAL MANOMETRY (EM);  Surgeon: Ruffin Frederick, MD;  Location: [facility];  Service: Gastroenterology;  Laterality: N/A;   GASTRIC BYPASS  2005   GASTRIC BYPASS  2005   ROTATOR CUFF REPAIR Left 2007   TOTAL KNEE ARTHROPLASTY Bilateral    Current Outpatient Medications on File Prior to Visit  Medication Sig Dispense Refill   ascorbic acid (VITAMIN C) 500 MG tablet Take 500 mg by mouth 2 (two) times daily.     atorvastatin (LIPITOR) 40 MG tablet Take 1 tablet (40 mg total) by mouth daily. 90 tablet 3   carvedilol (COREG) 3.125 MG tablet Take 1 tablet (3.125 mg total) by mouth 2 (two) times daily with a meal. 180 tablet 0   Cholecalciferol (VITAMIN D3) 250 MCG (10000 UT) TABS Take 1,000 Units by mouth in the morning and at bedtime.  clopidogrel (PLAVIX) 75 MG tablet Take 1 tablet (75 mg total) by mouth daily. 90 tablet 3   CO-ENZYME Q-10 PO Take 200 mg by mouth daily.     ferrous sulfate 325 (65 FE) MG tablet Take 325 mg by mouth daily.     levETIRAcetam (KEPPRA) 500 MG tablet Take 750 mg by mouth 2 (two) times daily.     Multiple Vitamins-Minerals (PRESERVISION AREDS 2 PO) Take 1 capsule by mouth 2 (two) times daily.     primidone (MYSOLINE) 50 MG tablet Take 1 tablet (50 mg total) by mouth 2 (two) times daily. 180 tablet 1   sacubitril-valsartan (ENTRESTO) 24-26 MG Take 1 tablet by mouth 2 (two) times daily. 180 tablet 3   topiramate (TOPAMAX) 50 MG tablet Take 1 tablet by mouth 2 (two) times daily.     Current Facility-Administered Medications on File Prior to Visit  Medication Dose Route Frequency Provider Last Rate Last Admin   0.9 %  sodium chloride infusion  500 mL Intravenous Continuous Armbruster, Willaim Rayas, MD       Allergies  Allergen Reactions    Sulfa Antibiotics Other (See Comments)    As child; unknown   Multihance [Gadobenate] Hives    1 singular hive post contrast    Tetanus Toxoids     The horse serum for tetanus   Current Medications, Allergies, Past Medical History, Past Surgical History, Family History and Social History were reviewed in [employer] record.  Review of Systems:   Constitutional: + Weight loss. Negative for fever or sweats.  Respiratory: Negative for shortness of breath.   Cardiovascular: Negative for chest pain, palpitations and leg swelling.  Gastrointestinal: See HPI.  Musculoskeletal: Negative for back pain or muscle aches.  Neurological: See HPI.  Physical Exam: BP (!) 90/58 (BP Location: Left Arm, Patient Position: Sitting, Cuff Size: Normal)   Pulse 64   Ht 5\' 3"  (1.6 m)   Wt 183 lb (83 kg)   BMI 32.42 kg/m  General: 77 year old male in no acute distress. Head: Normocephalic and atraumatic. Eyes: No scleral icterus. Conjunctiva pink . Ears: Normal auditory acuity. Mouth: Dentition intact. No ulcers or lesions.  Lungs: Clear throughout to auscultation. Heart: Regular rate and rhythm.  1/6 systolic murmur. Abdomen: Soft, nontender and nondistended. No masses or hepatomegaly. Normal bowel sounds x 4 quadrants.  Rectal: Deferred. Musculoskeletal: Symmetrical with no gross deformities. Extremities: No edema. Neurological: Alert oriented x 4. No focal deficits.  Psychological: Alert and cooperative. Normal mood and affect  Assessment and Recommendations: 77 year old male with a history of GERD and Barrett's esophagus with recurrent upper esophageal burning. EGD 06/2021 identified a small hiatal hernia, Barrett's esophagus, esophagus was tortuous and dilated with an angulated turn at the GE junction consistent with underlying aperistalsis.   -Continue Pantoprazole 40 mg p.o. 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  Patient describes feeling as if food sits in his upper esophagus which is chronic is ongoing for a few weeks then dissipates forward a week or two, which is not any better or worse since he underwent an EGD 06/2021, refer to EGD results as noted above. -Barium swallow with tablet  Colon cancer screening.  Colonoscopy 04/2014 showed diverticulosis no polyps.  Father was diagnosed with colon cancer in his 105s. -Dr. Adela Lank to verify if any further screening colonoscopies warranted   CAD s/p  past coronary stent placement on Plavix.   Chronic systolic CHF.  On Entresto.  LV EF 45 - 50% per echo 12/2021.  Syncope/presyncope, on Keppra per neurology.  Negative EEG.  Brain MRI without acute intracranial process.  Loop recorder per cardiology was negative for arrhythmia.  Chronic IDA on ferrous sulfate 325 mg daily

## 2022-07-28 NOTE — Progress Notes (Signed)
Merlin Loop Recorder  

## 2022-07-31 ENCOUNTER — Ambulatory Visit (HOSPITAL_COMMUNITY)
Admission: RE | Admit: 2022-07-31 | Discharge: 2022-07-31 | Disposition: A | Discharge status: Home / Self Care | Payer: Medicare Other | Source: Ambulatory Visit | Attending: Nurse Practitioner | Admitting: Nurse Practitioner

## 2022-07-31 DIAGNOSIS — R1319 Other dysphagia: Secondary | ICD-10-CM | POA: Diagnosis present

## 2022-07-31 DIAGNOSIS — K2270 Barrett's esophagus without dysplasia: Secondary | ICD-10-CM | POA: Insufficient documentation

## 2022-08-06 ENCOUNTER — Other Ambulatory Visit: Payer: Self-pay

## 2022-08-06 DIAGNOSIS — R1319 Other dysphagia: Secondary | ICD-10-CM

## 2022-08-07 ENCOUNTER — Ambulatory Visit (INDEPENDENT_AMBULATORY_CARE_PROVIDER_SITE_OTHER): Payer: Medicare Other

## 2022-08-07 DIAGNOSIS — R55 Syncope and collapse: Secondary | ICD-10-CM | POA: Diagnosis not present

## 2022-08-08 ENCOUNTER — Telehealth: Payer: Self-pay | Admitting: Gastroenterology

## 2022-08-08 NOTE — Telephone Encounter (Signed)
Pt is scheduled for a manometry and wants to know if the opening for 08/13/2022 is still available? If so, he would like to reschedule his appointment

## 2022-08-08 NOTE — Telephone Encounter (Signed)
Spoke with patient & advised him that I spoke with hospital scheduling and there is no longer an opening for 08/13/22, however if one becomes available we will contact him. Pt verbalized all understanding.

## 2022-08-15 ENCOUNTER — Telehealth: Payer: Self-pay

## 2022-08-15 ENCOUNTER — Other Ambulatory Visit: Payer: Self-pay

## 2022-08-15 DIAGNOSIS — K2270 Barrett's esophagus without dysplasia: Secondary | ICD-10-CM

## 2022-08-15 MED ORDER — PANTOPRAZOLE SODIUM 40 MG PO TBEC
40.0000 mg | DELAYED_RELEASE_TABLET | Freq: Two times a day (BID) | ORAL | 3 refills | Status: DC
Start: 2022-08-15 — End: 2022-11-11

## 2022-08-15 NOTE — Telephone Encounter (Signed)
Dr. Elease Hashimoto,  You saw this patient on 06/25/2022. He complained of fatigue at that time. Labs were normal.  Will you please comment on medical clearance for Lumbar ESI?  Please route your response to P CV DIV Preop. I will communicate with requesting office once you have given recommendations.   Thank you!  Ian Levering, NP

## 2022-08-15 NOTE — Telephone Encounter (Signed)
Ptds last OV was 06/25/22 with Dr. Elease Hashimoto.     Pre-operative Risk Assessment    Patient Name: Ian [name]  DOB: Sep 19, 1945 MRN: [id number]      Request for Surgical Clearance    Procedure:   Lumbar ESI  Date of Surgery:  Clearance 08/25/22                                 Surgeon:   Surgeon's Group or Practice Name:  [facility] Phone number:  [phone]  Fax number:  [phone] Karleen Dolphin   Type of Clearance Requested:   - Medical  - Pharmacy:  Hold Clopidogrel (Plavix) requesting office is asking for 7 day hold   Type of Anesthesia:  Not Indicated   Additional requests/questions:    Wynetta Fines   08/15/2022, 2:33 PM

## 2022-08-15 NOTE — Telephone Encounter (Signed)
Pantoprazole refilled as pharmacy requested. Ian [name] is up to date on his appointments.

## 2022-08-17 ENCOUNTER — Other Ambulatory Visit: Payer: Self-pay | Admitting: Nurse Practitioner

## 2022-08-17 NOTE — Telephone Encounter (Signed)
   Name: Ian [name]  DOB: 05-04-1945  MRN: [id number]   Primary Cardiologist: Kristeen Miss, MD  Chart reviewed as part of pre-operative protocol coverage. Ian [name] was last seen on 06/25/2022 by Dr. Elease Hashimoto.  Per Dr. Elease Hashimoto, patient "is at low risk for [facility] procedure."  Therefore, based on ACC/AHA guidelines, the patient would be at acceptable risk for the planned procedure without further cardiovascular testing.   Per office protocol, he may hold Plavix for 7 days prior to procedure and should resume as soon as hemodynamically stable postoperatively.   I will route this recommendation to the requesting party via Epic fax function and remove from pre-op pool. Please call with questions.  Carlos Levering, NP 08/17/2022, 7:10 PM

## 2022-08-19 NOTE — Progress Notes (Signed)
Carelink Summary Report / Loop Recorder 

## 2022-08-20 ENCOUNTER — Telehealth: Payer: Self-pay

## 2022-08-20 NOTE — Telephone Encounter (Signed)
"  Nahser, Ian Ping, MD  P Cv Div [facility] Triage I was talking to [place] today. He's had some low blood pressure readings. His heart rate is always slow.  We're going to stop his carvedilol for now. He'll keep a blood pressure log. We have him call us with blood pressure and heart rate readings in several weeks to see if we need to make any further medication adjustments.  PN"  Called pt to ensure he understands instructions. Pt expresses understanding.  No further needs at this time.

## 2022-09-08 ENCOUNTER — Ambulatory Visit (INDEPENDENT_AMBULATORY_CARE_PROVIDER_SITE_OTHER): Payer: Medicare Other

## 2022-09-08 DIAGNOSIS — R55 Syncope and collapse: Secondary | ICD-10-CM | POA: Diagnosis not present

## 2022-09-08 LAB — CUP PACEART REMOTE DEVICE CHECK
Date Time Interrogation Session: 20240825022134
Implantable Pulse Generator Implant Date: 20231218
Pulse Gen Serial Number: 511020630

## 2022-09-17 NOTE — Progress Notes (Signed)
Merlin Loop Recorder  

## 2022-10-08 LAB — CUP PACEART REMOTE DEVICE CHECK
Date Time Interrogation Session: 20240925131421
Implantable Pulse Generator Implant Date: 20231218
Pulse Gen Serial Number: 511020630

## 2022-10-13 ENCOUNTER — Ambulatory Visit (INDEPENDENT_AMBULATORY_CARE_PROVIDER_SITE_OTHER): Payer: Medicare Other

## 2022-10-13 DIAGNOSIS — R55 Syncope and collapse: Secondary | ICD-10-CM | POA: Diagnosis not present

## 2022-10-28 NOTE — Progress Notes (Signed)
Merlin Loop Recorder  

## 2022-11-05 ENCOUNTER — Encounter (HOSPITAL_COMMUNITY): Payer: Self-pay | Admitting: Gastroenterology

## 2022-11-05 ENCOUNTER — Ambulatory Visit (HOSPITAL_COMMUNITY)
Admission: RE | Admit: 2022-11-05 | Discharge: 2022-11-05 | Disposition: A | Discharge status: Home / Self Care | Payer: Medicare Other | Attending: Gastroenterology | Admitting: Gastroenterology

## 2022-11-05 ENCOUNTER — Encounter (HOSPITAL_COMMUNITY)
Admission: RE | Disposition: A | Discharge status: Home / Self Care | Payer: Self-pay | Source: Home / Self Care | Attending: Gastroenterology

## 2022-11-05 DIAGNOSIS — Z539 Procedure and treatment not carried out, unspecified reason: Secondary | ICD-10-CM | POA: Diagnosis not present

## 2022-11-05 DIAGNOSIS — R1310 Dysphagia, unspecified: Secondary | ICD-10-CM | POA: Diagnosis present

## 2022-11-05 SURGERY — INVASIVE LAB ABORTED CASE

## 2022-11-05 MED ORDER — LIDOCAINE VISCOUS HCL 2 % MT SOLN
OROMUCOSAL | Status: AC
  Filled 2022-11-05: qty 15

## 2022-11-05 SURGICAL SUPPLY — 2 items
FACESHIELD LNG OPTICON STERILE (SAFETY) IMPLANT
GLOVE BIO SURGEON STRL SZ8 (GLOVE) ×4 IMPLANT

## 2022-11-05 NOTE — Progress Notes (Signed)
Attempted to pass esophageal manometry probe x 2 per this nurse and Dow Adolph RN; unable to pass probe into stomach or view accurate landmarks to confirm tip of probe entering the stomach. Patient tolerate attempts well. Will notify Dr Adela Lank of findings.

## 2022-11-06 ENCOUNTER — Telehealth: Payer: Self-pay | Admitting: Gastroenterology

## 2022-11-06 NOTE — Telephone Encounter (Signed)
Nursing staff contacted me that they were unable to place manometry probe.  Jan can you help schedule routine office visit for this patient with me to reassess and discuss options?  Thanks

## 2022-11-10 NOTE — Telephone Encounter (Signed)
Scheduled patient for 11-21.  Called and left message for patient with appointment info.  Also send MyChart message to patient with appointment information

## 2022-11-11 ENCOUNTER — Encounter: Payer: Self-pay | Admitting: Gastroenterology

## 2022-11-11 ENCOUNTER — Ambulatory Visit: Payer: Medicare Other | Admitting: Gastroenterology

## 2022-11-11 VITALS — HR 67 | Ht 66.0 in | Wt 178.0 lb

## 2022-11-11 DIAGNOSIS — K219 Gastro-esophageal reflux disease without esophagitis: Secondary | ICD-10-CM

## 2022-11-11 DIAGNOSIS — R1310 Dysphagia, unspecified: Secondary | ICD-10-CM

## 2022-11-11 DIAGNOSIS — K2270 Barrett's esophagus without dysplasia: Secondary | ICD-10-CM

## 2022-11-11 MED ORDER — VOQUEZNA 20 MG PO TABS: 20.0000 mg | ORAL_TABLET | Freq: Every day | ORAL | 0 refills | Status: DC

## 2022-11-11 NOTE — Patient Instructions (Addendum)
We have given you samples of the following medication to take:  Voquezna 20 mg: Take once daily  Hold Protonix and Pepcid while take Voquezna  Thank you for entrusting me with your care and for choosing [facility], Dr. Ileene Patrick    If your blood pressure at your visit was 140/90 or greater, please contact your primary care physician to follow up on this. ______________________________________________________  If you are age 79 or older, your body mass index should be between 23-30. Your Body mass index is 28.73 kg/m. If this is out of the aforementioned range listed, please consider follow up with your Primary Care Provider.  If you are age 76 or younger, your body mass index should be between 19-25. Your Body mass index is 28.73 kg/m. If this is out of the aformentioned range listed, please consider follow up with your Primary Care Provider.  ________________________________________________________  The [place] providers would like to encourage you to use [facility] to communicate with providers for non-urgent requests or questions.  Due to long hold times on the telephone, sending your provider a message by [facility] may be a faster and more efficient way to get a response.  Please allow 48 business hours for a response.  Please remember that this is for non-urgent requests.  _______________________________________________________  Due to recent changes in healthcare laws, you may see the results of your imaging and laboratory studies on MyChart before your provider has had a chance to review them.  We understand that in some cases there may be results that are confusing or concerning to you. Not all laboratory results come back in the same time frame and the provider may be waiting for multiple results in order to interpret others.  Please give [place] 48 hours in order for your provider to thoroughly review all the results before contacting the office for clarification of your  results.

## 2022-11-11 NOTE — Progress Notes (Signed)
HPI :  77 year old male here for a follow-up visit today to discuss dysphagia, esophageal dysmotility, GERD, history of Barrett's.  Recall he does have a history of CAD with 3 stents in place, on Plavix, history of CHF, history of Roux-en-Y gastric bypass.  Recall he has had problems with reflux and dysphagia over time.  Recall he has had longstanding dysphagia.  He had a barium study in 2017 suggesting dysmotility.  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  He was referred to rheumatology at the time with a negative workup.  EGD was last done June 2023 he had a very dilated proximal esophagus with an angulated turn into the GEJ, with a short segment of Barrett's esophagus.  He has had ongoing problems with dysphagia, worse with solids.  He states on a mostly soft diet and avoids meats.  At his last visit in July we repeated a barium study which shows significant dilation of his upper esophagus with stasis, suggestion of significant dysmotility.  He then had an attempt at esophageal manometry to ensure no evidence of achalasia, and he could not tolerate the manometry catheter.   He is here today to discuss his options.  He has ongoing symptoms of occasional dysphagia.  He has reflux that bothers him quite a bit, on Protonix 40 mg twice daily as well as Pepcid nightly which she thinks has helped.  He is also using Tums frequently.  He has a sensation food getting stuck in his esophagus and not clearing into his stomach well.  He has been trying to "eat late" and eat small meals.  He has lost some weight but states he stabilized on 175 pounds.  He takes Plavix daily, denies any problems with his heart recently.  He does have a history of syncope and has had evaluation by cardiology with heart monitor in place currently.  He is also on Keppra.  We had a good discussion today of how aggressive he wanted to be with further  workup of some of these issues, and his options.    PAST GI PROCEDURES:   EGD 07/10/2021: - Small hiatal hernia.  - Barrett's esophagus as outlined. Biopsied.  - Dilated / tortous esophagus with angulated turn at the GEJ which pooled secretions, consistent with underlying aperistalsis.  - Roux-en-Y gastrojejunostomy with gastrojejunal anastomosis characterized by healthy appearing mucosa.  - Normal examined small bowel limb. - BARRETT'S ESOPHAGUS, NEGATIVE FOR DYSPLASIA   Colonoscopy 04/20/2014: Diverticulosis sigmoid colon Internal hemorrhoids    Esophageal manometry 02/02/2016: Normal relaxation of the EG junction A peristalsis concerning for possible scleroderma esophagus or mixed connective tissue disorder Findings are not consistent with achalasia   Barium swallow study 06/15/2015: 1. Abnormal markedly dilated and fixed esophagus with absent esophageal contractions/motility. 2. Associated gastroesophageal reflux. 3. Some associated mass effect from the proximal dilated esophagus on the lower cervical esophagus with concentric narrowing at the junction of the cervical and thoracic esophagus, however, at no functional obstruction noted, and the barium tablet passed without difficulty. 4. Superimposed changes of gastric bypass/gastrojejunostomy. 5. The findings were reviewed at the conclusion of this study with the patient. This esophageal abnormality would seem to be very longstanding, and it is unclear whether this is related to the patient cough symptoms. As far as the underlying etiology, the top differential considerations would include connective tissue disease and sequelae of chronic severe esophageal spasm.   Barium swallow 07/31/22: FINDINGS:  Single-contrast exam performed with the patient in RAO position. Mild limitations secondary to patient immobility and difficulty with multiple swallows.   Evaluation of primary peristalsis demonstrates lack of  normal primary peristaltic wave beyond the upper thoracic esophagus.   With multiple swallows, there is marked contrast stasis within a moderate to markedly dilated upper thoracic esophagus. No passage of contrast with patient prone. With upright positioning, there is eventual passage of contrast into a more normal caliber distal esophagus in the setting of probable small hiatal hernia. Status post gastric bypass, incompletely and suboptimally evaluated.   IMPRESSION: 1. Mild limitations as detailed above. 2. Incomplete primary peristaltic wave beyond the level of the upper thoracic esophagus. Contrast stasis/lack of passage on prone positioning within a moderate to markedly dilated upper and mid esophagus. Eventual filling of a more normal caliber distal esophagus with upright positioning. No well-defined stricture in the site of transition. Therefore, this contrast stasis is favored to be secondary to severe esophageal dysmotility. 3. Small hiatal hernia in the setting of prior gastric bypass.   Manometry attempted 10/23 but could not pass catheter  Past Medical History:  Diagnosis Date   Allergic rhinitis    Cataract    Coronary artery disease    Stent x1 2008   GERD (gastroesophageal reflux disease)    Hyperlipidemia    Hypertension    Syncope    on Keppra     Past Surgical History:  Procedure Laterality Date   ABDOMINAL HERNIA REPAIR     CARDIAC CATHETERIZATION     stent   CORONARY ANGIOPLASTY WITH STENT PLACEMENT  2008   stent x1   ESOPHAGEAL MANOMETRY N/A 02/06/2016   Procedure: ESOPHAGEAL MANOMETRY (EM);  Surgeon: Ruffin Frederick, MD;  Location: [facility];  Service: Gastroenterology;  Laterality: N/A;   GASTRIC BYPASS  2005   GASTRIC BYPASS  2005   ROTATOR CUFF REPAIR Left 2007   TOTAL KNEE ARTHROPLASTY Bilateral    Family History  Problem Relation Age of Onset   Breast cancer Mother    Diabetes Mother    Heart disease Mother    Prostate  cancer Father    Colon cancer Father 60   Heart disease Father    Breast cancer Sister    Diabetes Sister    Lung disease Neg Hx    Esophageal cancer Neg Hx    Social History   Tobacco Use   Smoking status: Former    Current packs/day: 0.00    Average packs/day: 1 pack/day for 18.3 years (18.3 ttl pk-yrs)    Types: Cigarettes    Start date: 02/09/1964    Quit date: 05/23/1982    Years since quitting: 40.4   Smokeless tobacco: Never   Tobacco comments:    Also smoked 4-5 cigars for 10 years after cigarettes  Vaping Use   Vaping status: Former  Substance Use Topics   Alcohol use: Yes    Alcohol/week: 0.0 standard drinks of alcohol    Comment: 1 glass wine a month   Drug use: No   Current Outpatient Medications  Medication Sig Dispense Refill   ascorbic acid (VITAMIN C) 500 MG tablet Take 500 mg by mouth 2 (two) times daily.     atorvastatin (LIPITOR) 40 MG tablet Take 1 tablet (40 mg total) by mouth daily. 90 tablet 3   Cholecalciferol (VITAMIN D3) 250 MCG (10000 UT) TABS Take 1,000 Units by mouth in the morning and at bedtime.     clopidogrel (PLAVIX) 75 MG  tablet Take 1 tablet (75 mg total) by mouth daily. 90 tablet 3   CO-ENZYME Q-10 PO Take 200 mg by mouth daily.     famotidine (PEPCID) 20 MG tablet TAKE 1 TABLET(20 MG) BY MOUTH DAILY AT SUPPER 90 tablet 1   ferrous sulfate 325 (65 FE) MG tablet Take 325 mg by mouth daily.     levETIRAcetam (KEPPRA) 500 MG tablet Take 750 mg by mouth 2 (two) times daily.     Multiple Vitamins-Minerals (PRESERVISION AREDS 2 PO) Take 1 capsule by mouth 2 (two) times daily.     pantoprazole (PROTONIX) 40 MG tablet Take 1 tablet (40 mg total) by mouth 2 (two) times daily. 180 tablet 3   primidone (MYSOLINE) 50 MG tablet Take 1 tablet (50 mg total) by mouth 2 (two) times daily. 180 tablet 1   sacubitril-valsartan (ENTRESTO) 24-26 MG Take 1 tablet by mouth 2 (two) times daily. 180 tablet 3   topiramate (TOPAMAX) 50 MG tablet Take 1 tablet by  mouth 2 (two) times daily.     Current Facility-Administered Medications  Medication Dose Route Frequency Provider Last Rate Last Admin   0.9 %  sodium chloride infusion  500 mL Intravenous Continuous Shari Natt, Willaim Rayas, MD       Allergies  Allergen Reactions   Sulfa Antibiotics Other (See Comments)    As child; unknown   Multihance [Gadobenate] Hives    1 singular hive post contrast    Tetanus Toxoids     The horse serum for tetanus     Review of Systems: All systems reviewed and negative except where noted in HPI.   Lab Results  Component Value Date   WBC 5.3 06/25/2022   HGB 14.0 06/25/2022   HCT 42.6 06/25/2022   MCV 96 06/25/2022   PLT 193 06/25/2022    Lab Results  Component Value Date   NA 143 06/25/2022   CL 109 (H) 06/25/2022   K 3.7 06/25/2022   CO2 24 06/25/2022   BUN 18 06/25/2022   CREATININE 0.88 06/25/2022   EGFR 87 06/25/2022   CALCIUM 8.9 06/25/2022   ALBUMIN 3.4 (L) 10/10/2020   GLUCOSE 102 (H) 06/25/2022    Lab Results  Component Value Date   ALT 39 06/25/2022   AST 40 10/10/2020   ALKPHOS 70 10/10/2020   BILITOT 0.7 10/10/2020     Physical Exam: BP 110/70   Pulse 67   Ht 5\' 6"  (1.676 m)   Wt 178 lb (80.7 kg)   BMI 28.73 kg/m  Constitutional: Pleasant,well-developed, male in no acute distress. Neurological: Alert and oriented to person place and time. Psychiatric: Normal mood and affect. Behavior is normal.   ASSESSMENT: 77 y.o. male here for assessment of the following  1. Dysphagia, unspecified type   2. Gastroesophageal reflux disease, unspecified whether esophagitis present   3. Barrett's esophagus without dysplasia    Ongoing dysphagia secondary to severe dysmotility.  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  I explained to them that depending on type of achalasia we could potentially treat that with Botox or other endoscopic therapy,  however if this is purely aperistalsis from underlying dysmotility there may not be any good treatment option for this.  Rheumatology workup negative.  Esophageal manometry would be the most useful test to ensure no evidence of achalasia, unfortunately he did not tolerate that last week.  He states his eyes teared up, he  could not tolerated and aborted.  We discussed if he really wanted to pursue further evaluation for this it would entail doing an EGD and then placing manometry catheter under anesthesia, followed by manometry when he wakes up.  We discussed what that would entail and if he wants to pursue that or not.  After lengthy discussion he does wish to have manometry done to reassess for any evidence of achalasia which could potentially influence his treatment options.  The question is timing of this given there is limited ability at the hospital to do this, wait time likely 3 to 4 months.  We will get back with him regards to scheduling of this.  I think the case can be done while on Plavix.  Otherwise, he is on max medical therapy in regards to Protonix and Pepcid and still using Tums for breakthrough reflux, again likely due to his underlying dysmotility.  Discussed options, gave him a free sample of Voquezna 20mg  / day to try and see if it helps.  If he likes this more so than PPI then can get him a prescription for it.  Otherwise in general I think he may do better with a more liquid based diet and avoiding any bulky solid foods.  He will try to use more liquid supplements to his diet such as boost, Ensure etc.  I do not think he is in need of a feeding tube at this point but if this were to progress and he progressively loses weight would have to consider that.   PLAN: - schedule EGD with manometry probe placement at the hospital - timing to be determined will need to coordinate with endoscopy at the hospital - trial of Voquezna samples 20mg  / day for 10 days - hold protonix / pepcid while  on this - recommend mostly liquid diet, he may feel better with this - further recommendations pending results and his course  Harlin Rain, MD [facility]astroenterology

## 2022-11-12 ENCOUNTER — Other Ambulatory Visit: Payer: Self-pay

## 2022-11-12 ENCOUNTER — Telehealth: Payer: Self-pay

## 2022-11-12 DIAGNOSIS — K2270 Barrett's esophagus without dysplasia: Secondary | ICD-10-CM

## 2022-11-12 DIAGNOSIS — K219 Gastro-esophageal reflux disease without esophagitis: Secondary | ICD-10-CM

## 2022-11-12 DIAGNOSIS — R1310 Dysphagia, unspecified: Secondary | ICD-10-CM

## 2022-11-12 DIAGNOSIS — R1319 Other dysphagia: Secondary | ICD-10-CM

## 2022-11-12 NOTE — Telephone Encounter (Signed)
Per Dr. Adela Lank, patient needs to be scheduled for EGD with endoscopic placement of manometry, followed by the manometry study at [facility]. Per Norman Clay, [facility] unit, "The 20th of January would be better. It would need to be scheduled in the morning for 0830 to allow time to set up probe prior to case.  Also would need to schedule for 1 hour slot please".  Called patient to see if he could come on Monday, January 20th, Patient would need to arrive at 7:00am.

## 2022-11-12 NOTE — Telephone Encounter (Signed)
Randie Heinz, thanks very much Jan for your help coordinating this

## 2022-11-13 NOTE — Telephone Encounter (Signed)
Called and spoke to patient and his wife.  They are agreeable to an in person Previsit on 01-19-23 at 10am to go over instructions for procedure on 02-02-23

## 2022-11-13 NOTE — Telephone Encounter (Signed)
Called scheduling. Patient has already been added to Dr. Lanetta Inch schedule for 1-20 at 8:30am.with manometry to follow. Patient will need a PV for instructions. He is on Eliquis. PV scheduled for 01-19-23

## 2022-11-13 NOTE — Telephone Encounter (Signed)
Patient returned your call and confirmed the procedure date for January 20th. Requested instruction and to be placed on the wait list.

## 2022-11-17 ENCOUNTER — Telehealth: Payer: Self-pay | Admitting: Gastroenterology

## 2022-11-17 ENCOUNTER — Ambulatory Visit (INDEPENDENT_AMBULATORY_CARE_PROVIDER_SITE_OTHER): Payer: Medicare Other

## 2022-11-17 DIAGNOSIS — R55 Syncope and collapse: Secondary | ICD-10-CM | POA: Diagnosis not present

## 2022-11-17 MED ORDER — VOQUEZNA 20 MG PO TABS: 20.0000 mg | ORAL_TABLET | Freq: Every day | ORAL | 0 refills | Status: DC

## 2022-11-17 NOTE — Telephone Encounter (Signed)
Prescription for Voquezna 20 mg once daily for 8 weeks sent to [facility] pharmacy. Called patient and let him know.  He requested additional samples until he can get script filled through [facility].  Three boxes of samples (15 days worth) of samples left for patient on the 3rd floor reception. Patient expressed understanding and will come get samples tomorrow

## 2022-11-17 NOTE — Telephone Encounter (Signed)
PT was given samples of Voquezna and they have helped him a lot. He would like to have a script sent to [facility] on [address].

## 2022-11-20 LAB — CUP PACEART REMOTE DEVICE CHECK
Date Time Interrogation Session: 20241106135858
Implantable Pulse Generator Implant Date: 20231218
Pulse Gen Serial Number: 511020630

## 2022-11-26 ENCOUNTER — Other Ambulatory Visit: Payer: Self-pay | Admitting: Cardiovascular Disease

## 2022-12-02 NOTE — Telephone Encounter (Signed)
Inbound call from patient, calling to follow up on Voquenza medication, patient states he is almost out and has not heard any updates.

## 2022-12-02 NOTE — Telephone Encounter (Signed)
Called [facility]. They have not heard back regarding the PA for Voquezna  Once they do they will contact the patient.

## 2022-12-02 NOTE — Telephone Encounter (Signed)
Called patient and let him know we are waiting on PA.  He is almost out of medication. We can see if we can get more samples to tide him over

## 2022-12-03 ENCOUNTER — Telehealth: Payer: Self-pay | Admitting: Pharmacy Technician

## 2022-12-03 ENCOUNTER — Other Ambulatory Visit (HOSPITAL_COMMUNITY): Payer: Self-pay

## 2022-12-03 NOTE — Telephone Encounter (Signed)
DM to [facility] PA team to check on status of PA

## 2022-12-03 NOTE — Telephone Encounter (Signed)
MyChart message to patient that Voquezna samples are ready to be picked up on the 3rd floor

## 2022-12-03 NOTE — Telephone Encounter (Signed)
Pharmacy Patient Advocate Encounter   Received notification from CoverMyMeds that prior authorization for VOQUEZNA 20MG  is required/requested.   Insurance verification completed.   The patient is insured through [facility] .   Per test claim: PA required; PA submitted to above mentioned insurance via CoverMyMeds Key/confirmation #/EOC [id number] Status is pending

## 2022-12-04 ENCOUNTER — Ambulatory Visit: Payer: Medicare Other | Admitting: Gastroenterology

## 2022-12-04 MED ORDER — DEXLANSOPRAZOLE 60 MG PO CPDR: 60.0000 mg | DELAYED_RELEASE_CAPSULE | Freq: Every day | ORAL | 3 refills | Status: DC

## 2022-12-04 NOTE — Telephone Encounter (Signed)
Okay thanks for letting me know.  Sounded like Voquezna helped him and not covered by insurance.  Given his options as outlined, I recommend Dexilant 60 mg daily and see if that works okay for him.  If not looks like he would need to fail some other options which I think would likely be a waste of his time but we will see how things go.  Hopefully Dexilant works for him.  Thanks.  Can you try giving him Dexilant 60 mg/day, #90 refill 3.

## 2022-12-04 NOTE — Telephone Encounter (Signed)
PA denied. See other phone note.

## 2022-12-04 NOTE — Telephone Encounter (Signed)
Inbound call from [facility] stating Voquezna has been denied for patient. States all information will be sent through fax. Please advise, thank you

## 2022-12-04 NOTE — Telephone Encounter (Signed)
Pharmacy Patient Advocate Encounter  Received notification from [facility] that Prior Authorization for [facility] 20MG  has been DENIED.  Full denial letter will be uploaded to the media tab. See denial reason below.   PA #/Case ID/Reference #:  [id number]

## 2022-12-04 NOTE — Telephone Encounter (Signed)
Called patient.Patient expressed understanding. Prescription sent to [facility] on [address]

## 2022-12-08 ENCOUNTER — Other Ambulatory Visit (HOSPITAL_COMMUNITY): Payer: Self-pay

## 2022-12-08 NOTE — Telephone Encounter (Signed)
Indexed appeal form to pt's chart, should it be needed in the near future. (Voquezna)

## 2022-12-15 NOTE — Progress Notes (Signed)
Merlin Loop Recorder  

## 2022-12-18 ENCOUNTER — Telehealth: Payer: Self-pay | Admitting: *Deleted

## 2022-12-18 NOTE — Telephone Encounter (Signed)
Pt has been scheduled tomorrow 12/19/22 tele preop appt due to proc date and med hold. Pt states he did not take his Plavix today, advised pt to hold Plavix now until after his procedure.   Med rec and consent are done.     Patient Consent for Virtual Visit        Ian [name] has provided verbal consent on 12/18/2022 for a virtual visit (video or telephone).   CONSENT FOR VIRTUAL VISIT FOR:  Ian [name]  By participating in this virtual visit I agree to the following:  I hereby voluntarily request, consent and authorize [facility] and its employed or contracted physicians, physician assistants, nurse practitioners or other licensed health care professionals (the Practitioner), to provide me with telemedicine health care services (the "Services") as deemed necessary by the treating Practitioner. I acknowledge and consent to receive the Services by the Practitioner via telemedicine. I understand that the telemedicine visit will involve communicating with the Practitioner through live audiovisual communication technology and the disclosure of certain medical information by electronic transmission. I acknowledge that I have been given the opportunity to request an in-person assessment or other available alternative prior to the telemedicine visit and am voluntarily participating in the telemedicine visit.  I understand that I have the right to withhold or withdraw my consent to the use of telemedicine in the course of my care at any time, without affecting my right to future care or treatment, and that the Practitioner or I may terminate the telemedicine visit at any time. I understand that I have the right to inspect all information obtained and/or recorded in the course of the telemedicine visit and may receive copies of available information for a reasonable fee.  I understand that some of the potential risks of receiving the Services via telemedicine include:  Delay or interruption in  medical evaluation due to technological equipment failure or disruption; Information transmitted may not be sufficient (e.g. poor resolution of images) to allow for appropriate medical decision making by the Practitioner; and/or  In rare instances, security protocols could fail, causing a breach of personal health information.  Furthermore, I acknowledge that it is my responsibility to provide information about my medical history, conditions and care that is complete and accurate to the best of my ability. I acknowledge that Practitioner's advice, recommendations, and/or decision may be based on factors not within their control, such as incomplete or inaccurate data provided by me or distortions of diagnostic images or specimens that may result from electronic transmissions. I understand that the practice of medicine is not an exact science and that Practitioner makes no warranties or guarantees regarding treatment outcomes. I acknowledge that a copy of this consent can be made available to me via my patient portal [facility] MyChart), or I can request a printed copy by calling the office of [facility].    I understand that my insurance will be billed for this visit.   I have read or had this consent read to me. I understand the contents of this consent, which adequately explains the benefits and risks of the Services being provided via telemedicine.  I have been provided ample opportunity to ask questions regarding this consent and the Services and have had my questions answered to my satisfaction. I give my informed consent for the services to be provided through the use of telemedicine in my medical care

## 2022-12-18 NOTE — Telephone Encounter (Signed)
Pt has been scheduled tomorrow 12/19/22 tele preop appt due to proc date and med hold. Pt states he did not take his Plavix today, advised pt to hold Plavix now until after his procedure.    Med rec and consent are done.

## 2022-12-18 NOTE — Telephone Encounter (Signed)
   Pre-operative Risk Assessment    Patient Name: Ian [name]  DOB: Apr 08, 1945 MRN: [id number]  DATE OF LAST VISIT: 06/25/22 DR. NAHSER DATE OF NEXT VISIT: 12/23/22 DR. NAHSER 6 MONTH F/U    Request for Surgical Clearance    Procedure:   LUMBAR ESI  Date of Surgery:  Clearance 12/25/22                                 Surgeon: NOT LISTED Surgeon's Group or Practice Name:  [facility] Phone number:  [phone] Fax number:  [phone]    Type of Clearance Requested:   - Medical  - Pharmacy:  Hold Clopidogrel (Plavix) 7 DAYS PRIOR    Type of Anesthesia:  Not Indicated   Additional requests/questions:    [name]   12/18/2022, 4:20 PM

## 2022-12-18 NOTE — Telephone Encounter (Signed)
   Name: Ian [name]  DOB: January 09, 1946  MRN: [id number]  Primary Cardiologist: Kristeen Miss, MD   Preoperative team, please contact this patient and set up a phone call appointment for further preoperative risk assessment. Please obtain consent and complete medication review. Thank you for your help.  I confirm that guidance regarding antiplatelet and oral anticoagulation therapy has been completed and, if necessary, noted below.  Per office protocol, he may hold Plavix for 7 days prior to procedure and should resume as soon as hemodynamically stable postoperatively.   I also confirmed the patient resides in the state of [place]. As per [facility] Medical Board telemedicine laws, the patient must reside in the state in which the provider is licensed.   Ronney Asters, NP 12/18/2022, 4:30 PM [facility]

## 2022-12-19 ENCOUNTER — Ambulatory Visit: Payer: Medicare Other | Attending: Cardiology

## 2022-12-19 DIAGNOSIS — Z01810 Encounter for preprocedural cardiovascular examination: Secondary | ICD-10-CM | POA: Diagnosis not present

## 2022-12-19 NOTE — Progress Notes (Signed)
Virtual Visit via Telephone Note   Because of Ian [name]'s co-morbid illnesses, he is at least at moderate risk for complications without adequate follow up.  This format is felt to be most appropriate for this patient at this time.  The patient did not have access to video technology/had technical difficulties with video requiring transitioning to audio format only (telephone).  All issues noted in this document were discussed and addressed.  No physical exam could be performed with this format.  Please refer to the patient's chart for his consent to telehealth for [facility]  Evaluation Performed:  Preoperative cardiovascular risk assessment _____________   Date:  12/19/2022   Patient ID:  Ian [name], DOB August 30, 1945, MRN [id number] Patient Location:  Home Provider location:   Office  Primary Care Provider:  Gaspar Garbe, MD Primary Cardiologist:  Ian Miss, MD  Chief Complaint / Patient Profile   77 y.o. y/o male with a h/o LBBB, chronic combined systolic and diastolic CHF, CAD who is pending LUMBAR ESI and presents today for telephonic preoperative cardiovascular risk assessment.  History of Present Illness    Ian [name] is a 77 y.o. male who presents via audio/video conferencing for a telehealth visit today.  Pt was last seen in cardiology clinic on 06/25/2022 by Dr. Elease [name].  At that time Ian [name] was doing well .  The patient is now pending procedure as outlined above. Since his last visit, he continues to be stable from a cardiac standpoint.  Today he denies chest pain, shortness of breath, lower extremity edema, fatigue, palpitations, melena, hematuria, hemoptysis, diaphoresis, weakness, presyncope, syncope, orthopnea, and PND.   Past Medical History    Past Medical History:  Diagnosis Date   Allergic rhinitis    Cataract    Coronary artery disease    Stent x1 2008   GERD (gastroesophageal reflux disease)    Hyperlipidemia    Hypertension     Syncope    on Keppra   Past Surgical History:  Procedure Laterality Date   ABDOMINAL HERNIA REPAIR     CARDIAC CATHETERIZATION     stent   CORONARY ANGIOPLASTY WITH STENT PLACEMENT  2008   stent x1   ESOPHAGEAL MANOMETRY N/A 02/06/2016   Procedure: ESOPHAGEAL MANOMETRY (EM);  Surgeon: Ian Frederick, MD;  Location: [facility];  Service: Gastroenterology;  Laterality: N/A;   GASTRIC BYPASS  2005   GASTRIC BYPASS  2005   ROTATOR CUFF REPAIR Left 2007   TOTAL KNEE ARTHROPLASTY Bilateral     Allergies  Allergies  Allergen Reactions   Sulfa Antibiotics Other (See Comments)    As child; unknown   Multihance [Gadobenate] Hives    1 singular hive post contrast    Tetanus Toxoids     The horse serum for tetanus    Home Medications    Prior to Admission medications   Medication Sig Start Date End Date Taking? Authorizing Provider  ascorbic acid (VITAMIN C) 500 MG tablet Take 500 mg by mouth 2 (two) times daily.    [provider]  atorvastatin (LIPITOR) 40 MG tablet Take 1 tablet (40 mg total) by mouth daily. 06/04/22   [name], Ian Ping, MD  Cholecalciferol (VITAMIN D3) 250 MCG (10000 UT) TABS Take 1,000 Units by mouth in the morning and at bedtime.    [provider]  clopidogrel (PLAVIX) 75 MG tablet TAKE 1 TABLET(75 MG) BY MOUTH DAILY 11/26/22   [name], Ian Ping, MD  CO-ENZYME Q-10 PO Take 200  mg by mouth daily.    [provider]  dexlansoprazole (DEXILANT) 60 MG capsule Take 1 capsule (60 mg total) by mouth daily. 12/04/22   [name], Ian Rayas, MD  ferrous sulfate 325 (65 FE) MG tablet Take 325 mg by mouth daily. 09/02/17   [provider]  levETIRAcetam (KEPPRA) 500 MG tablet Take 750 mg by mouth 2 (two) times daily. 04/04/22   [provider]  Multiple Vitamins-Minerals (PRESERVISION AREDS 2 PO) Take 1 capsule by mouth 2 (two) times daily.    [provider]  primidone (MYSOLINE) 50 MG tablet Take 1 tablet (50 mg  total) by mouth 2 (two) times daily. Patient taking differently: Take 50 mg by mouth as directed. 50 MG IN THE AM AND 100 IN THE PM 05/07/21   [name], Ian Batty, DO  sacubitril-valsartan (ENTRESTO) 24-26 MG Take 1 tablet by mouth 2 (two) times daily. 01/20/22   [name], Ian Ping, MD  topiramate (TOPAMAX) 50 MG tablet Take 1 tablet by mouth 2 (two) times daily.    [provider]    Physical Exam    Vital Signs:  Ian [name] does not have vital signs available for review today.  Given telephonic nature of communication, physical exam is limited. AAOx3. NAD. Normal affect.  Speech and respirations are unlabored.  Accessory Clinical Findings    None  Assessment & Plan    1.  Preoperative Cardiovascular Risk Assessment: LUMBAR ESI   Date of Surgery:  Clearance 12/25/22                                 Surgeon: NOT LISTED Surgeon's Group or Practice Name:  Ian [name] Phone number:  [phone] Fax number:  [phone]       Primary Cardiologist: Ian Miss, MD  Chart reviewed as part of pre-operative protocol coverage. Given past medical history and time since last visit, based on ACC/AHA guidelines, Ian [name] would be at acceptable risk for the planned procedure without further cardiovascular testing.   Patient was advised that if he develops new symptoms prior to surgery to contact our office to arrange a follow-up appointment.  He verbalized understanding.  Per office protocol, he may hold Plavix for 7 days prior to procedure and should resume as soon as hemodynamically stable postoperatively.   I will route this recommendation to the requesting party via Epic fax function and remove from pre-op pool.      Time:   Today, I have spent 5 minutes with the patient with telehealth technology discussing medical history, symptoms, and management plan.     Ian Asters, NP  12/19/2022, 7:23 AM   Prior to patient's phone evaluation I spent greater than 10 minutes  reviewing their past medical history and cardiac medications.

## 2022-12-22 ENCOUNTER — Ambulatory Visit: Payer: Medicare Other

## 2022-12-22 ENCOUNTER — Encounter: Payer: Self-pay | Admitting: Cardiovascular Disease

## 2022-12-22 DIAGNOSIS — R55 Syncope and collapse: Secondary | ICD-10-CM

## 2022-12-22 NOTE — Progress Notes (Unsigned)
Cardiology Office Note:    Date:  12/22/2022   ID:  Ian [name], DOB 11-25-1945, MRN [id number]  PCP:  Ian Garbe, MD  Cardiologist:  Ian Miss, MD  Electrophysiologist:  Ian Small, MD   Referring MD:  Ian [name]   Problem list 1.  Coronary artery disease 2.  Hyperlipidemia 3.  Hypertension 4.  Left bundle branch block 5.  Hyperlipidemia 6. Gastric bypass - ~2005 7  Anemia ( iron def. B12 deficiency )   Chief Complaint  Patient presents with   Coronary Artery Disease        Congestive Heart Failure             Ian [name] is a 77 y.o. male with a hx of   CAD, HTN, LBBB Just moved from [place].  Seen with wife Ian [name].  Ian [name] is seen today for the first time.  He has a history of coronary artery disease.  He has a history of stenting in August, 2019 in [place]. He is felt well following the stents. Recent  ( Sept. 2017, 2019) total cholesterol level is 90.  The HDL is 35.  The LDL is 46.  The triglyceride level is 44.  Retired Presenter, broadcasting / Surveyor, minerals  Works in his [employer] now  No real exercise   OCT. 27, 2020: Ian [name] is seen today for follow-up of his coronary artery disease.  He has a history of hyperlipidemia.  He has a stable LBBB.  Busy in his wood shop Has been diagnosed with essential tremor ( is on Primidone)  We discussed drug interactions with his multiple cardiac meds.  No CP since his stenting several years ago   Jan. 31, 2022: Ian [name] is seen today for follow up of his CAD, HLD. LBBB  Hx of coronary stenting in 2019.  Still doing lots of woodworking .    He told me about a new table saw - the sawstop  Is very active in his shop but no real cardio exercise   June 29, 2020: Ian [name] is seen today for follow up of his CAD and LBBB, HTN Echo on Feb. 18, 2022 shows mildly reduced LV EF (40-45%) and he was started on Entresto 24-26 BID He has mild AS Is losing some weight  Is on Primidone and topamax  Has lost 20 pounds .  Dec. 6,  2022 Ian [name] is seen today for follow up of his CAD, LBBB, HTN Echo in Feb. 2022 sows EF 40-45%. Was started on Entresto 24-26 BID   He had an episode of orthostatic hypotension while checking her wife's tires .  Had lost 35-40 lbs .  CT of the head was normal,  MRI of brain was normal  Was seen by Dr. Wylene [name].  Coreg was reduced to 3.125 BID  Feels better , no further episodes of syncope  He check his bp regularly ,    January 28, 2021: Ian [name] is seen today for a work in visit.  He was doing some woodworking last week and developed an episode of unresponsiveness.  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  His daughter was with him out in the shop and stated that he came to approximately a minute or so later.  Following that he felt fine.  There was no postictal symptoms.  There is no loss of  consciousness.  Has some vague right sided chest pain  - not similar to his  angina. Not pleuretic  Has bee present for several weeks   Had a similar episode while putting air in his wifes tires.  Sound like a TIA  Is on plavix   June 18, 2021; Ian [name] is seen today for follow up of his CAD, CHF, LBBB He has had some unusual neurologic issues ( near syncope / loss of awareness)  Has been evaluated by neuro - EEG was negative 14 day monitor did not reveal any significant arrhythmias.  Labs were reviewed ALT is 142. Lipids:  Chol is 113 HDL - 56 Trig - 49 LDL - 45   His tremor has generally worsened.  Wants to restart topamax  Dr. Arbutus [name] is concerned about kidney stones He has only had kidney stones on one occasion .   He is willing to take the risk of renal stones if he can get neuro to prescribe it again . He has taken it in the past with no issues.   Tolerating the Entresto well No orthostasis   Having difficulty swallowing  - will need to see GI at some point  Having R hip pain and R leg numbness on occasion ( after lots of  exercise / stairs )   [facility].   May need a MRI   Dec. 11, 2023  Ian [name] is seen for follow up of his CAD, stenting  ( 2018, 2020) CHF,  Had another episode of syncope 2 weeks ago . While blowing leaves .  Larey Seat on his arm and shoulder  May have had some mild presyncope prior to the  No CP , breathing is ok   June 25, 2022 Ian [name] is seen for follow up of his CAD, CHF, syncope  No further episodes of his syncope  No CP , no dyspnea Not much exercise  Wt is 184  Is fatigued often Likes to nap Will check a CBC and TSH    Dec. 10, 2024 Ian [name] is seen for follow up visit of his CAD, CHF, syncope Wt is 180 lbs   No further passing out spells since he increased the Keppra dose   No CP , no dyspnea  Some GI issues , swallowing issues,  food gets stuck  Going for a EGD in Jan 20 .          Past Medical History:  Diagnosis Date   Allergic rhinitis    Cataract    Coronary artery disease    Stent x1 2008   GERD (gastroesophageal reflux disease)    Hyperlipidemia    Hypertension    Syncope    on Keppra    Past Surgical History:  Procedure Laterality Date   ABDOMINAL HERNIA REPAIR     CARDIAC CATHETERIZATION     stent   CORONARY ANGIOPLASTY WITH STENT PLACEMENT  2008   stent x1   ESOPHAGEAL MANOMETRY N/A 02/06/2016   Procedure: ESOPHAGEAL MANOMETRY (EM);  Surgeon: Ruffin Frederick, MD;  [facility];  Service: Gastroenterology;  Laterality: N/A;   GASTRIC BYPASS  2005   GASTRIC BYPASS  2005   ROTATOR CUFF REPAIR Left 2007   TOTAL KNEE ARTHROPLASTY Bilateral     Current Medications: No outpatient medications have been marked as taking for the 12/23/22 encounter (Office Visit) with Elease Hashimoto, Deloris Ping, MD.   Current Facility-Administered Medications for the 12/23/22 encounter (Office Visit) with Mattson Dayal, Deloris Ping, MD  Medication   0.9 %  sodium chloride infusion     Allergies:   Sulfa antibiotics,  Multihance [gadobenate], and Tetanus  toxoids   Social History   Socioeconomic History   Marital status: Married    Spouse name: Not on file   Number of children: 2   Years of education: Not on file   Highest education level: Not on file  Occupational History   Occupation: brick layer  Tobacco Use   Smoking status: Former    Current packs/day: 0.00    Average packs/day: 1 pack/day for 18.3 years (18.3 ttl pk-yrs)    Types: Cigarettes    Start date: 02/09/1964    Quit date: 05/23/1982    Years since quitting: 40.6   Smokeless tobacco: Never   Tobacco comments:    Also smoked 4-5 cigars for 10 years after cigarettes  Vaping Use   Vaping status: Former  Substance and Sexual Activity   Alcohol use: Yes    Alcohol/week: 0.0 standard drinks of alcohol    Comment: 1 glass wine a month   Drug use: No   Sexual activity: Not on file  Other Topics Concern   Not on file  Social History Narrative   Originally from [place]. Has lived in [place], [place]. Previously has traveled to [place], [place], [place], [place], & on [place] cruises. Currently works as a Designer, fashion/clothing. No pets currently. Remote bird exposure. No mold or hot tub exposure. No known asbestos exposure.    Right handed   Lives with wife   Currently retired    International aid/development worker of Corporate investment banker Strain: Not on file  Food Insecurity: Not on file  Transportation Needs: Not on file  Physical Activity: Not on file  Stress: Not on file  Social Connections: Not on file     Family History: The patient's family history includes Breast cancer in his mother and sister; Colon cancer (age of onset: 37) in his father; Diabetes in his mother and sister; Heart disease in his father and mother; Prostate cancer in his father. There is no history of Lung disease or Esophageal cancer.  ROS:   Please see the history of present illness.     All other systems reviewed and are negative.  EKGs/Labs/Other Studies Reviewed:    The following studies were reviewed  today:    Recent Labs: 06/25/2022: ALT 39; BUN 18; Creatinine, Ser 0.88; Hemoglobin 14.0; Platelets 193; Potassium 3.7; Sodium 143; TSH 2.340  Recent Lipid Panel    Component Value Date/Time   CHOL 103 06/25/2022 0905   TRIG 36 06/25/2022 0905   HDL 44 06/25/2022 0905   CHOLHDL 2.3 06/25/2022 0905   LDLCALC 49 06/25/2022 0905      Physical Exam: There were no vitals taken for this visit.  No BP recorded.  {Refresh Note OR Click here to enter BP  :1}***    GEN:  Well nourished, well developed in no acute distress HEENT: Normal NECK: No JVD; No carotid bruits LYMPHATICS: No lymphadenopathy CARDIAC: RRR ,  soft systolic murmur  RESPIRATORY:  Clear to auscultation without rales, wheezing or rhonchi  ABDOMEN: Soft, non-tender, non-distended MUSCULOSKELETAL:  No edema; No deformity  SKIN: Warm and dry NEUROLOGIC:  Alert and oriented x 3    ECG:   EKG Interpretation Date/Time:  Tuesday December 23 2022 08:35:14 EST Ventricular Rate:  56 PR Interval:  198 QRS Duration:  174 QT Interval:  474 QTC Calculation: 457 R Axis:   9  Text Interpretation: Sinus bradycardia Left bundle branch block When compared with ECG of 10-Oct-2020 17:48, No significant change  since last tracing Confirmed by Ian [name] [id number]) on 12/23/2022 8:56:52 AM      ASSESSMENT:    No diagnosis found.        PLAN:       Syncope :   no further episodes      2.  coronary artery disease:    no angina .        3.    Hyperlipidemia:  Labs from sept show LDL is 59 Cont meds.       4.  Chronic systolic congestive heart failure: Continue Entresto 24-26 twice a day.  Continue carvedilol 3.125 mg twice a day.       5.  Obesity:  s/p gastric bypass      Medication Adjustments/Labs and Tests Ordered: Current medicines are reviewed at length with the patient today.  Concerns regarding medicines are outlined above.  No orders of the defined types were placed in this  encounter.    No orders of the defined types were placed in this encounter.      There are no Patient Instructions on file for this visit.   Signed, Ian Miss, MD  12/22/2022 8:43 PM    [facility]

## 2022-12-23 ENCOUNTER — Ambulatory Visit: Payer: Medicare Other | Attending: Cardiovascular Disease | Admitting: Cardiovascular Disease

## 2022-12-23 ENCOUNTER — Encounter: Payer: Self-pay | Admitting: Cardiovascular Disease

## 2022-12-23 VITALS — BP 104/66 | HR 66 | Ht 66.0 in | Wt 180.2 lb

## 2022-12-23 DIAGNOSIS — I5042 Chronic combined systolic (congestive) and diastolic (congestive) heart failure: Secondary | ICD-10-CM

## 2022-12-23 DIAGNOSIS — I447 Left bundle-branch block, unspecified: Secondary | ICD-10-CM

## 2022-12-23 DIAGNOSIS — I2510 Atherosclerotic heart disease of native coronary artery without angina pectoris: Secondary | ICD-10-CM

## 2022-12-23 DIAGNOSIS — E782 Mixed hyperlipidemia: Secondary | ICD-10-CM

## 2022-12-23 LAB — CUP PACEART REMOTE DEVICE CHECK
Date Time Interrogation Session: 20241209020910
Implantable Pulse Generator Implant Date: 20231218
Pulse Gen Serial Number: 511020630

## 2022-12-23 NOTE — Patient Instructions (Signed)
 Follow-Up: At [facility], you and your health needs are our priority.  As part of our continuing mission to provide you with exceptional heart care, we have created designated Provider Care Teams.  These Care Teams include your primary Cardiologist (physician) and Advanced Practice Providers (APPs -  Physician Assistants and Nurse Practitioners) who all work together to provide you with the care you need, when you need it.  Your next appointment:   1 year(s)  Provider:   Thurmon Fair, MD

## 2023-01-12 ENCOUNTER — Telehealth: Payer: Self-pay | Admitting: Gastroenterology

## 2023-01-12 NOTE — Telephone Encounter (Signed)
Inbound call from patient, would like to speak to Jan in regards to hospital procedure at [facility].

## 2023-01-12 NOTE — Telephone Encounter (Signed)
I spoke with Ian [name] and he just wanted to clarify about his upcoming appointments-date/time/reason. Questions answered.

## 2023-01-16 ENCOUNTER — Other Ambulatory Visit: Payer: Self-pay

## 2023-01-16 MED ORDER — SACUBITRIL-VALSARTAN 24-26 MG PO TABS: 1.0000 | ORAL_TABLET | Freq: Two times a day (BID) | ORAL | 0 refills | Status: DC

## 2023-01-16 NOTE — Progress Notes (Signed)
 Good morning Ms. Ian [name]. happy Friday . will you send in a refill for [name]'s Entresto to [facility] [place] [facility]   Above message received via secure chat from Dr Elease Hashimoto. Medication sent as requested.

## 2023-01-19 ENCOUNTER — Ambulatory Visit (AMBULATORY_SURGERY_CENTER): Payer: Medicare Other

## 2023-01-19 VITALS — Ht 66.0 in | Wt 178.0 lb

## 2023-01-19 DIAGNOSIS — R1319 Other dysphagia: Secondary | ICD-10-CM

## 2023-01-19 DIAGNOSIS — K2270 Barrett's esophagus without dysplasia: Secondary | ICD-10-CM

## 2023-01-19 DIAGNOSIS — K219 Gastro-esophageal reflux disease without esophagitis: Secondary | ICD-10-CM

## 2023-01-19 DIAGNOSIS — K220 Achalasia of cardia: Secondary | ICD-10-CM

## 2023-01-19 NOTE — Progress Notes (Signed)
 Pre visit completed via in person; Patient verified name, DOB, and address; No egg or soy allergy known to patient  No issues known to pt with past sedation with any surgeries or procedures; Patient denies ever being told they had issues or difficulty with intubation;  No FH of Malignant Hyperthermia; Pt is not on diet pills; Pt is not on home 02;  Pt IS ON blood thinners- Dr. Peggy stated that patient does not need to hold PLAVIX  prior to his procedure; patient is on IRON but policy states patient does not need to hold this medication either; Pt denies issues with constipation;  No A fib or A flutter; Have any cardiac testing pending--NO Insurance verified during PV appt--- [facility] Medicare Pt can ambulate without assistance;  Pt denies use of chewing tobacco Discussed diabetic/weight loss medication holds; Discussed NSAID holds; Checked BMI to be less than 50; Pt instructed to use Singlecare.com or GoodRx for a price reduction on prep;  Pre visit completed and red dot placed by patient's name on their procedure day (on provider's schedule).    Patient's procedure has been scheduled at [facility], instructions printed and given to patient at time of PV appt;  Message sent to Dr. Leigh to request if patient needs to hold his Dexilant  prior to his EGD/manometry at [facility]?

## 2023-01-19 NOTE — Progress Notes (Signed)
 Response received from Dr. Leigh that patient can stay on his Dexilant  prior to his [facility] procedure;   Called and informed patient of this information- patient verbalized understanding of information;  Patient advised to call back to the office at [phone] should questions/concerns arise;

## 2023-01-21 DIAGNOSIS — R058 Other specified cough: Secondary | ICD-10-CM | POA: Diagnosis not present

## 2023-01-21 DIAGNOSIS — I5022 Chronic systolic (congestive) heart failure: Secondary | ICD-10-CM | POA: Diagnosis not present

## 2023-01-26 ENCOUNTER — Ambulatory Visit (INDEPENDENT_AMBULATORY_CARE_PROVIDER_SITE_OTHER): Payer: Medicare Other

## 2023-01-26 ENCOUNTER — Encounter (HOSPITAL_COMMUNITY): Payer: Self-pay | Admitting: Gastroenterology

## 2023-01-26 DIAGNOSIS — R55 Syncope and collapse: Secondary | ICD-10-CM

## 2023-01-27 LAB — CUP PACEART REMOTE DEVICE CHECK
Date Time Interrogation Session: 20250113100041
Implantable Pulse Generator Implant Date: 20231218
Pulse Gen Serial Number: 511020630

## 2023-01-29 NOTE — Addendum Note (Signed)
Addended by: Geralyn Flash D on: 01/29/2023 02:29 PM   Modules accepted: Orders

## 2023-01-29 NOTE — Progress Notes (Signed)
Merlin Loop Recorder  

## 2023-01-30 ENCOUNTER — Encounter: Payer: Self-pay | Admitting: Gastroenterology

## 2023-02-02 ENCOUNTER — Ambulatory Visit (HOSPITAL_COMMUNITY)
Admission: RE | Admit: 2023-02-02 | Discharge: 2023-02-02 | Disposition: A | Discharge status: Home / Self Care | Payer: Medicare Other | Attending: Gastroenterology | Admitting: Gastroenterology

## 2023-02-02 ENCOUNTER — Encounter (HOSPITAL_COMMUNITY): Payer: Self-pay | Admitting: Gastroenterology

## 2023-02-02 ENCOUNTER — Ambulatory Visit (HOSPITAL_COMMUNITY): Payer: Medicare Other

## 2023-02-02 ENCOUNTER — Encounter (HOSPITAL_COMMUNITY)
Admission: RE | Disposition: A | Discharge status: Home / Self Care | Payer: Self-pay | Source: Home / Self Care | Attending: Gastroenterology

## 2023-02-02 ENCOUNTER — Other Ambulatory Visit: Payer: Self-pay

## 2023-02-02 DIAGNOSIS — E119 Type 2 diabetes mellitus without complications: Secondary | ICD-10-CM

## 2023-02-02 DIAGNOSIS — Z8249 Family history of ischemic heart disease and other diseases of the circulatory system: Secondary | ICD-10-CM | POA: Insufficient documentation

## 2023-02-02 DIAGNOSIS — Z7902 Long term (current) use of antithrombotics/antiplatelets: Secondary | ICD-10-CM | POA: Insufficient documentation

## 2023-02-02 DIAGNOSIS — K219 Gastro-esophageal reflux disease without esophagitis: Secondary | ICD-10-CM | POA: Insufficient documentation

## 2023-02-02 DIAGNOSIS — Z980 Intestinal bypass and anastomosis status: Secondary | ICD-10-CM

## 2023-02-02 DIAGNOSIS — Z833 Family history of diabetes mellitus: Secondary | ICD-10-CM | POA: Insufficient documentation

## 2023-02-02 DIAGNOSIS — I2510 Atherosclerotic heart disease of native coronary artery without angina pectoris: Secondary | ICD-10-CM | POA: Insufficient documentation

## 2023-02-02 DIAGNOSIS — K2270 Barrett's esophagus without dysplasia: Secondary | ICD-10-CM | POA: Diagnosis not present

## 2023-02-02 DIAGNOSIS — Z87891 Personal history of nicotine dependence: Secondary | ICD-10-CM | POA: Insufficient documentation

## 2023-02-02 DIAGNOSIS — K2289 Other specified disease of esophagus: Secondary | ICD-10-CM

## 2023-02-02 DIAGNOSIS — D649 Anemia, unspecified: Secondary | ICD-10-CM | POA: Insufficient documentation

## 2023-02-02 DIAGNOSIS — I10 Essential (primary) hypertension: Secondary | ICD-10-CM | POA: Insufficient documentation

## 2023-02-02 DIAGNOSIS — K449 Diaphragmatic hernia without obstruction or gangrene: Secondary | ICD-10-CM

## 2023-02-02 DIAGNOSIS — R1310 Dysphagia, unspecified: Secondary | ICD-10-CM

## 2023-02-02 DIAGNOSIS — Z955 Presence of coronary angioplasty implant and graft: Secondary | ICD-10-CM | POA: Insufficient documentation

## 2023-02-02 HISTORY — PX: ESOPHAGOGASTRODUODENOSCOPY (EGD) WITH PROPOFOL: SHX5813

## 2023-02-02 HISTORY — PX: ESOPHAGEAL MANOMETRY: SHX5429

## 2023-02-02 HISTORY — PX: BIOPSY: SHX5522

## 2023-02-02 SURGERY — ESOPHAGOGASTRODUODENOSCOPY (EGD) WITH PROPOFOL
Anesthesia: Monitor Anesthesia Care

## 2023-02-02 SURGERY — MANOMETRY, ESOPHAGUS

## 2023-02-02 SURGERY — ESOPHAGEAL MANOMETRY (EM)

## 2023-02-02 MED ORDER — SODIUM CHLORIDE 0.9 % IV SOLN: INTRAVENOUS | Status: DC | PRN

## 2023-02-02 MED ORDER — PHENYLEPHRINE HCL (PRESSORS) 10 MG/ML IV SOLN
INTRAVENOUS | Status: DC | PRN
  Administered 2023-02-02: 100 ug via INTRAVENOUS

## 2023-02-02 MED ORDER — GLYCOPYRROLATE 0.2 MG/ML IJ SOLN
INTRAMUSCULAR | Status: DC | PRN
  Administered 2023-02-02: .2 mg via INTRAVENOUS

## 2023-02-02 MED ORDER — PROPOFOL 10 MG/ML IV BOLUS
INTRAVENOUS | Status: DC | PRN
  Administered 2023-02-02: 100 ug/kg/min via INTRAVENOUS
  Administered 2023-02-02: 60 mg via INTRAVENOUS
  Administered 2023-02-02: 20 mg via INTRAVENOUS

## 2023-02-02 MED ORDER — PROPOFOL 500 MG/50ML IV EMUL
INTRAVENOUS | Status: AC
  Filled 2023-02-02: qty 50

## 2023-02-02 SURGICAL SUPPLY — 16 items
BLOCK BITE 60FR ADLT L/F BLUE (MISCELLANEOUS) ×2 IMPLANT
ELECT REM PT RETURN 9FT ADLT (ELECTROSURGICAL)
ELECTRODE REM PT RTRN 9FT ADLT (ELECTROSURGICAL) IMPLANT
FACESHIELD LNG OPTICON STERILE (SAFETY) IMPLANT
FORCEP RJ3 GP 1.8X160 W-NEEDLE (CUTTING FORCEPS) IMPLANT
FORCEPS BIOP RAD 4 LRG CAP 4 (CUTTING FORCEPS) IMPLANT
GLOVE BIO SURGEON STRL SZ8 (GLOVE) ×4 IMPLANT
NDL SCLEROTHERAPY 25GX240 (NEEDLE) IMPLANT
NEEDLE SCLEROTHERAPY 25GX240 (NEEDLE) IMPLANT
PROBE APC STR FIRE (PROBE) IMPLANT
PROBE INJECTION GOLD 7FR (MISCELLANEOUS) IMPLANT
SNARE SHORT THROW 13M SML OVAL (MISCELLANEOUS) IMPLANT
SYR 50ML LL SCALE MARK (SYRINGE) IMPLANT
TUBING ENDO SMARTCAP PENTAX (MISCELLANEOUS) ×4 IMPLANT
TUBING IRRIGATION ENDOGATOR (MISCELLANEOUS) ×2 IMPLANT
WATER STERILE IRR 1000ML POUR (IV SOLUTION) IMPLANT

## 2023-02-02 SURGICAL SUPPLY — 2 items
FACESHIELD LNG OPTICON STERILE (SAFETY) IMPLANT
GLOVE BIO SURGEON STRL SZ8 (GLOVE) ×2 IMPLANT

## 2023-02-02 NOTE — Op Note (Signed)
[facility] Patient Name: Ian [name] Procedure Date: 02/02/2023 MRN: [id number] Attending MD: Willaim Rayas. 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 Here for                            repeat EGD with manometry probe placement. History                            of Barrett's Providers:                Willaim Rayas. Adela Lank, MD, Lorenza Evangelist, RN,                            Rozetta Nunnery, Technician Referring MD:             Willaim Rayas. Adela Lank, MD Medicines:                Monitored Anesthesia Care Complications:            No immediate complications. Estimated blood loss:                            Minimal. Estimated Blood Loss:     Estimated blood loss was minimal. Procedure:                Pre-Anesthesia Assessment:                           - Prior to the procedure, a History and Physical                            was performed, and patient medications and                            allergies were reviewed. The patient's tolerance of                            previous anesthesia was also reviewed. The risks                            and benefits of the procedure and the sedation                            options and risks were discussed with the patient.                            All questions were answered, and informed consent  was obtained. Prior Anticoagulants: The patient has                            taken Plavix (clopidogrel), last dose was 1 day                            prior to procedure. ASA Grade Assessment: III - A                            patient with severe systemic disease. After                            reviewing the risks and benefits, the patient was                             deemed in satisfactory condition to undergo the                            procedure.                           After obtaining informed consent, the endoscope was                            passed under direct vision. Throughout the                            procedure, the patient's blood pressure, pulse, and                            oxygen saturations were monitored continuously. The                            GIF-H190 ([id number]) Olympus endoscope was introduced                            through the mouth, and advanced to the proximal                            jejunum. The upper GI endoscopy was accomplished                            without difficulty. The patient tolerated the                            procedure well. Scope In: Scope Out: Findings:      Esophagogastric landmarks were identified: the gastroesophageal junction       was found at 36 cm from the incisors. Landmarks hard to measure given       history of roux-en-y, hiatal hernia, and markedly angulated turn into       the GEJ. The LES appeared widely patent, in part due to hiatal hernia.      A small hiatal hernia was present.      The Z-line was irregular - there is  a suspected short segment of       Barrett's roughly 2-3cm or so. Biopsies were taken with a cold forceps       for histology. No nodularity seen.      The lumen of the esophagus was dilated with a markedly angulated turn in       the distal esophagus entering the hernia sac / gastric pouch (sigmoid       esophagus). Changes of stasis were seen. Manometry catheter was easily       placed from the right nare into the esophagus. It was not easy however       to traverse large angulated turn and get the catheter to go into the       gastric pouch. A snare was used to gently grasp the very distal end of       the manometry catheter and to place it endoscopically in the gastric       pouch.      Evidence of a Roux-en-Y gastrojejunostomy was  found. The gastrojejunal       anastomosis was characterized by healthy appearing mucosa. Gastric pouch       was small, could not retroflex.      The examined small bowel limb and blind limb were normal. Impression:               - Esophagogastric landmarks identified.                           - Small hiatal hernia.                           - Z-line irregular - short segment Barrett's likely                            a few cm's in length - exact measurements difficult                            to obtain given altered anatomy. Biopsied.                           - Dilation in the entire esophagus.                           - Endoscopic placement of manometry probe.                           - Roux-en-Y gastrojejunostomy with gastrojejunal                            anastomosis characterized by healthy appearing                            mucosa.                           - Normal examined small bowel limbs. Moderate Sedation:      No moderate sedation, case performed with MAC Recommendation:           - Patient has a contact number available for  emergencies. The signs and symptoms of potential                            delayed complications were discussed with the                            patient. Return to normal activities tomorrow.                            Written discharge instructions were provided to the                            patient.                           - Perform manometry once the patient wakes up, then                            remove probe                           - Resume previous diet.                           - Continue present medications.                           - Continue Plavix                           - Await pathology results. Procedure Code(s):        --- Professional ---                           [phone], Esophagogastroduodenoscopy, flexible,                            transoral; with biopsy, single or  multiple Diagnosis Code(s):        --- Professional ---                           K44.9, Diaphragmatic hernia without obstruction or                            gangrene                           K22.89, Other specified disease of esophagus                           Z98.0, Intestinal bypass and anastomosis status                           R13.10, Dysphagia, unspecified CPT copyright 2022 American Medical Association. All rights reserved. The codes documented in this report are preliminary and upon coder review may  be revised to meet current compliance requirements. Viviann Spare P. Louise Victory, MD 02/02/2023 9:10:38 AM This report has been signed  electronically. Number of Addenda: 0

## 2023-02-02 NOTE — Anesthesia Postprocedure Evaluation (Signed)
Anesthesia Post Note  Patient: Dawuan Bendolph  Procedure(s) Performed: ESOPHAGOGASTRODUODENOSCOPY (EGD) WITH PROPOFOL ESOPHAGEAL MANOMETRY (EM) BIOPSY     Patient location during evaluation: PACU Anesthesia Type: MAC Level of consciousness: awake and alert Pain management: pain level controlled Vital Signs Assessment: post-procedure vital signs reviewed and stable Respiratory status: spontaneous breathing, nonlabored ventilation, respiratory function stable and patient connected to nasal cannula oxygen Cardiovascular status: stable and blood pressure returned to baseline Postop Assessment: no apparent nausea or vomiting Anesthetic complications: no  No notable events documented.  Last Vitals:  Vitals:   02/02/23 0930 02/02/23 0940  BP: 107/62 103/65  Pulse: (!) 58 62  Resp: 17 16  Temp:    SpO2: 99% 99%    Last Pain:  Vitals:   02/02/23 0915  TempSrc:   PainSc: 0-No pain                 Shelton Silvas

## 2023-02-02 NOTE — Progress Notes (Signed)
Esophageal manometry probe placed by Dr Adela Lank during EGD. Esophageal manometry performed per protocol. Patient tolerated well.

## 2023-02-02 NOTE — H&P (Signed)
[facility] History and Physical   Primary Care Physician:  Tisovec, Adelfa Koh, MD   Reason for Procedure:   Dysphagia - rule out achalasia  Plan:    EGD with placement of esophageal manometry probe     HPI: Ian [name] is a 78 y.o. male  here for EGD with placement of manometry probe. Patient has known aperistalsis and abnormal EGD, concerning for achalasia. Remote manometry did not show achalasia. Symptoms persistent / worsening. Negative rheumatology workup. Previously attempted manometry in recent months but the patient could not tolerate placement of the catheter. He is here to have it placed while under anesthesia.   Otherwise feels well without any cardiopulmonary symptoms. He is ON Plavix. I have discussed risks / benefits of the procedure and anesthesia. An additional risk of placement of manometry catheter is nose bleeding. Following discussion of what these procedures entailed he wants to proceed, further recommendations pending the results.   I have discussed risks / benefits of anesthesia and endoscopic procedure with [name] and they wish to proceed with the exams as outlined today.    Past Medical History:  Diagnosis Date   Allergic rhinitis    Cataract    Coronary artery disease    Stent x1 2008   GERD (gastroesophageal reflux disease)    Hyperlipidemia    Hypertension    Syncope    on Keppra    Past Surgical History:  Procedure Laterality Date   ABDOMINAL HERNIA REPAIR     CARDIAC CATHETERIZATION     stent   CORONARY ANGIOPLASTY WITH STENT PLACEMENT  2008   stent x1   ESOPHAGEAL MANOMETRY N/A 02/06/2016   Procedure: ESOPHAGEAL MANOMETRY (EM);  Surgeon: Ruffin Frederick, MD;  Location: [facility];  Service: Gastroenterology;  Laterality: N/A;   GASTRIC BYPASS  2005   GASTRIC BYPASS  2005   ROTATOR CUFF REPAIR Left 2007   TOTAL KNEE ARTHROPLASTY Bilateral     Prior to Admission medications   Medication Sig Start Date End Date  Taking? Authorizing Provider  ascorbic acid (VITAMIN C) 500 MG tablet Take 500 mg by mouth 2 (two) times daily.   Yes [provider]  atorvastatin (LIPITOR) 40 MG tablet Take 1 tablet (40 mg total) by mouth daily. 06/04/22  Yes Nahser, Deloris Ping, MD  Cholecalciferol (VITAMIN D3) 250 MCG (10000 UT) TABS Take 1,000 Units by mouth in the morning and at bedtime.   Yes [provider]  clopidogrel (PLAVIX) 75 MG tablet TAKE 1 TABLET(75 MG) BY MOUTH DAILY 11/26/22  Yes Nahser, Deloris Ping, MD  CO-ENZYME Q-10 PO Take 200 mg by mouth daily.   Yes [provider]  dexlansoprazole (DEXILANT) 60 MG capsule Take 1 capsule (60 mg total) by mouth daily. 12/04/22  Yes Kerria Sapien, Willaim Rayas, MD  ferrous sulfate 325 (65 FE) MG tablet Take 325 mg by mouth daily. 09/02/17  Yes [provider]  levETIRAcetam (KEPPRA) 500 MG tablet Take 750 mg by mouth 2 (two) times daily. 04/04/22  Yes [provider]  Multiple Vitamins-Minerals (PRESERVISION AREDS 2 PO) Take 1 capsule by mouth 2 (two) times daily.   Yes [provider]  sacubitril-valsartan (ENTRESTO) 24-26 MG Take 1 tablet by mouth 2 (two) times daily. 01/16/23  Yes Nahser, Deloris Ping, MD  topiramate (TOPAMAX) 50 MG tablet Take 1 tablet by mouth 2 (two) times daily.   Yes [provider]  primidone (MYSOLINE) 50 MG tablet Take 1 tablet (50 mg total) by mouth 2 (  two) times daily. Patient taking differently: Take 50 mg by mouth as directed. 50 MG IN THE AM AND 100 IN THE PM 05/07/21   Tat, Octaviano Batty, DO    No current facility-administered medications for this encounter.    Allergies as of 11/12/2022 - Review Complete 11/11/2022  Allergen Reaction Noted   Sulfa antibiotics Other (See Comments) 05/23/2015   Multihance [gadobenate] Hives 03/28/2021   Tetanus toxoids  09/06/2015    Family History  Problem Relation Age of Onset   Colon polyps Mother 65   Breast cancer Mother 70   Diabetes Mother    Heart  disease Mother    Colon polyps Father    Prostate cancer Father 4   Colon cancer Father 57   Heart disease Father    Breast cancer Sister 29   Diabetes Sister    Lung disease Neg Hx    Esophageal cancer Neg Hx     Social History   Socioeconomic History   Marital status: Married    Spouse name: Not on file   Number of children: 2   Years of education: Not on file   Highest education level: Not on file  Occupational History   Occupation: Statistician  Tobacco Use   Smoking status: Former    Current packs/day: 0.00    Average packs/day: 1 pack/day for 18.3 years (18.3 ttl pk-yrs)    Types: Cigarettes    Start date: 02/09/1964    Quit date: 05/23/1982    Years since quitting: 40.7   Smokeless tobacco: Never   Tobacco comments:    Also smoked 4-5 cigars for 10 years after cigarettes  Vaping Use   Vaping status: Former  Substance and Sexual Activity   Alcohol use: Not Currently    Comment: 1 glass wine a month   Drug use: No   Sexual activity: Not on file  Other Topics Concern   Not on file  Social History Narrative   Originally from [place]. Has lived in [place], [place]. Previously has traveled to [place], [place], [place], [place], & on [place] cruises. Currently works as a Designer, fashion/clothing. No pets currently. Remote bird exposure. No mold or hot tub exposure. No known asbestos exposure.    Right handed   Lives with wife   Currently retired    Chief Executive Officer Drivers of Corporate investment banker Strain: Not on [employer] Insecurity: Not on file  Transportation Needs: Not on file  Physical Activity: Not on file  Stress: Not on file  Social Connections: Not on file  Intimate Partner Violence: Not on file    Review of Systems: All other review of systems negative except as mentioned in the HPI.  Physical Exam: Vital signs There were no vitals taken for this visit.  General:   Alert,  Well-developed, pleasant and cooperative in NAD Lungs:  Clear throughout to auscultation.    Heart:  Regular rate and rhythm Abdomen:  Soft, nontender and nondistended.   Neuro/Psych:  Alert and cooperative. Normal mood and affect. A and O x 3  Harlin Rain, MD [facility]

## 2023-02-02 NOTE — Transfer of Care (Signed)
Immediate Anesthesia Transfer of Care Note  Patient: Ian [name]  Procedure(s) Performed: ESOPHAGOGASTRODUODENOSCOPY (EGD) WITH PROPOFOL ESOPHAGEAL MANOMETRY (EM) BIOPSY  Patient Location: PACU  Anesthesia Type:MAC  Level of Consciousness: awake, alert , oriented, and patient cooperative  Airway & Oxygen Therapy: Patient Spontanous Breathing and Patient connected to nasal cannula oxygen  Post-op Assessment: Report given to RN and Post -op Vital signs reviewed and stable  Post vital signs: Reviewed and stable  Last Vitals:  Vitals Value Taken Time  BP 97/72 02/02/23 0913  Temp    Pulse 61 02/02/23 0915  Resp 22 02/02/23 0915  SpO2 96 % 02/02/23 0915  Vitals shown include unfiled device data.  Last Pain:  Vitals:   02/02/23 0729  TempSrc: Tympanic  PainSc: 0-No pain         Complications: No notable events documented.

## 2023-02-02 NOTE — Anesthesia Preprocedure Evaluation (Addendum)
Anesthesia Evaluation  Patient identified by MRN, date of birth, ID band Patient awake    Reviewed: Allergy & Precautions, NPO status , Patient's Chart, lab work & pertinent test results  Airway Mallampati: II  TM Distance: >3 FB Neck ROM: Full    Dental  (+) Upper Dentures, Dental Advisory Given Pt unable to pull out his upper denture. :   Pulmonary former smoker   breath sounds clear to auscultation       Cardiovascular hypertension, Pt. on medications + CAD and + Cardiac Stents  + dysrhythmias  Rhythm:Regular Rate:Normal     Neuro/Psych negative neurological ROS  negative psych ROS   GI/Hepatic Neg liver ROS,GERD  ,,  Endo/Other  diabetes    Renal/GU negative Renal ROS  negative genitourinary   Musculoskeletal   Abdominal   Peds negative pediatric ROS (+)  Hematology  (+) Blood dyscrasia, anemia   Anesthesia Other Findings   Reproductive/Obstetrics                             Anesthesia Physical Anesthesia Plan  ASA: 3  Anesthesia Plan: MAC   Post-op Pain Management: Minimal or no pain anticipated   Induction: Intravenous  PONV Risk Score and Plan: 0 and Propofol infusion  Airway Management Planned: Natural Airway  Additional Equipment: None  Intra-op Plan:   Post-operative Plan:   Informed Consent: I have reviewed the patients History and Physical, chart, labs and discussed the procedure including the risks, benefits and alternatives for the proposed anesthesia with the patient or authorized representative who has indicated his/her understanding and acceptance.       Plan Discussed with: CRNA  Anesthesia Plan Comments:        Anesthesia Quick Evaluation

## 2023-02-02 NOTE — Discharge Instructions (Signed)

## 2023-02-03 ENCOUNTER — Other Ambulatory Visit: Payer: Self-pay

## 2023-02-03 DIAGNOSIS — K224 Dyskinesia of esophagus: Secondary | ICD-10-CM | POA: Diagnosis not present

## 2023-02-03 DIAGNOSIS — R1310 Dysphagia, unspecified: Secondary | ICD-10-CM | POA: Diagnosis not present

## 2023-02-03 LAB — SURGICAL PATHOLOGY

## 2023-02-03 MED ORDER — SACUBITRIL-VALSARTAN 24-26 MG PO TABS: 1.0000 | ORAL_TABLET | Freq: Two times a day (BID) | ORAL | 3 refills | Status: DC

## 2023-02-03 NOTE — Progress Notes (Signed)
Via secure chat from Dr Elease Hashimoto:  for some reason , Ian [name] was renewed but no refills were ordered. will you please order 90 day supply with 3 refills   Refill sent to pharmacy at this time.

## 2023-02-04 ENCOUNTER — Encounter (HOSPITAL_COMMUNITY): Payer: Self-pay | Admitting: Gastroenterology

## 2023-02-07 ENCOUNTER — Encounter: Payer: Self-pay | Admitting: Cardiovascular Disease

## 2023-03-02 ENCOUNTER — Ambulatory Visit (INDEPENDENT_AMBULATORY_CARE_PROVIDER_SITE_OTHER): Payer: Medicare Other

## 2023-03-02 DIAGNOSIS — R55 Syncope and collapse: Secondary | ICD-10-CM | POA: Diagnosis not present

## 2023-03-03 LAB — CUP PACEART REMOTE DEVICE CHECK
Date Time Interrogation Session: 20250217080110
Implantable Pulse Generator Implant Date: 20231218
Pulse Gen Serial Number: 511020630

## 2023-03-04 ENCOUNTER — Telehealth: Payer: Self-pay | Admitting: Pharmacy Technician

## 2023-03-04 ENCOUNTER — Other Ambulatory Visit (HOSPITAL_COMMUNITY): Payer: Self-pay

## 2023-03-04 NOTE — Telephone Encounter (Signed)
These are the plan alternatives that may result in lesser copays.   Copay estimates with [facility] test billing Omeprazole - $0 Pantoprazole - $0 Esomeprazole - $6 Lansoprazole - $6 Rabeprazole  - $16.1096

## 2023-03-04 NOTE — Telephone Encounter (Signed)
Pharmacy Patient Advocate Encounter   Received notification from Fax that prior authorization for DEXLANSOPRAZOLE 60MG  is required/requested.   Insurance verification completed.   The patient is insured through [employer]  .   Per test claim: The current 30 day co-pay is, $96.51.  No PA needed at this time. This test claim was processed through [facility]- copay amounts may vary at other pharmacies due to pharmacy/plan contracts, or as the patient moves through the different stages of their insurance plan.     INSURANCE WILL NOT PAY FOR 90-DAY SUPPLY

## 2023-03-05 MED ORDER — ESOMEPRAZOLE MAGNESIUM 40 MG PO CPDR: 40.0000 mg | DELAYED_RELEASE_CAPSULE | Freq: Two times a day (BID) | ORAL | 3 refills | Status: DC

## 2023-03-05 NOTE — Telephone Encounter (Signed)
Called and spoke to patient.. Let him know his insurance wants him to try, and fail, 3 other medications on his formulary before they are willing to consider covering Voquezna.  He has tried pantoprazole and omeprazole and dexlansoprazole but not esomeprazole, lansoprazole or rapeprazole. He is willing to try Nexium.  Please advise on dosing. Thank you.

## 2023-03-05 NOTE — Telephone Encounter (Signed)
PT returning call to leave the number he was to provide to nurse concerning medications. [phone]

## 2023-03-05 NOTE — Telephone Encounter (Signed)
Nexium script sent to pharmacy

## 2023-03-05 NOTE — Telephone Encounter (Signed)
Okay, thanks very much.  Lets try Nexium 40 mg twice daily.  We can give 1 month trial with 3 refills.  If this works for him we will continue it.  If it does not then we will see what else we can get him

## 2023-03-05 NOTE — Telephone Encounter (Signed)
He has already failed max dose protonix and I think a few others. We had him on Voquezna which they did not cover (it helped) and then tried Dexilant and sounds like not covered.  Jan can you let him know his insurance is declining both Voquezna and Dexilant - difficult situation, we can try to appeal. If he wants to try any of those that are listed I am happy to give him any of these. I do think he has already failed a few of them however.

## 2023-03-05 NOTE — Addendum Note (Signed)
Addended by: Cooper Render on: 03/05/2023 03:21 PM   Modules accepted: Orders

## 2023-03-10 ENCOUNTER — Telehealth: Payer: Self-pay | Admitting: Gastroenterology

## 2023-03-10 NOTE — Telephone Encounter (Signed)
 Called patient. He received a notice from [facility] that a procedure he had with Dr. Adela Lank on 1-20 was not covered. Procedure code:  03474 Unlisted procedure small intestine. Payment denied. Patient had an EGD with Esophageal manometry probe placement.

## 2023-03-10 NOTE — Telephone Encounter (Signed)
 Inbound call from patient requesting to speak with Jan regarding previous procedure. Patient did not disclose further detail. Please advise, thank you.

## 2023-03-11 NOTE — Addendum Note (Signed)
 Addended by: Geralyn Flash D on: 03/11/2023 09:41 AM   Modules accepted: Orders

## 2023-03-11 NOTE — Telephone Encounter (Signed)
 Called and spoke to patient.  Indicated we are looking into it but that he should call [facility] and discuss with them why they are indicating it is not covered.  I let him know that just bc something has been precertified doesn't indicated how, and if, something will be covered.  He would appreciate a call to discuss if it was coded properly to help understand why they are not wanting to cover it.

## 2023-03-11 NOTE — Progress Notes (Signed)
 Merlin [address] [employer]

## 2023-03-15 ENCOUNTER — Encounter: Payer: Self-pay | Admitting: Cardiovascular Disease

## 2023-03-18 DIAGNOSIS — Z961 Presence of intraocular lens: Secondary | ICD-10-CM | POA: Diagnosis not present

## 2023-03-18 DIAGNOSIS — H3530 Unspecified macular degeneration: Secondary | ICD-10-CM | POA: Diagnosis not present

## 2023-03-18 DIAGNOSIS — H02889 Meibomian gland dysfunction of unspecified eye, unspecified eyelid: Secondary | ICD-10-CM | POA: Diagnosis not present

## 2023-03-18 DIAGNOSIS — H35372 Puckering of macula, left eye: Secondary | ICD-10-CM | POA: Diagnosis not present

## 2023-03-18 DIAGNOSIS — R809 Proteinuria, unspecified: Secondary | ICD-10-CM | POA: Diagnosis not present

## 2023-03-18 DIAGNOSIS — I130 Hypertensive heart and chronic kidney disease with heart failure and stage 1 through stage 4 chronic kidney disease, or unspecified chronic kidney disease: Secondary | ICD-10-CM | POA: Diagnosis not present

## 2023-03-23 NOTE — Telephone Encounter (Signed)
 Rec'd notification from [employer] that esomeprazole has a "quantity limit" and they will not cover more than #30 for a 30 day supply UNLESS you get an exception from the plan.  Can you please request an exception for esomeprazole 40 mg BID?  Thank you

## 2023-03-30 ENCOUNTER — Telehealth: Payer: Self-pay

## 2023-03-30 ENCOUNTER — Other Ambulatory Visit (HOSPITAL_COMMUNITY): Payer: Self-pay

## 2023-03-30 NOTE — Telephone Encounter (Signed)
 Pharmacy Patient Advocate Encounter   Received notification from Pt Calls Messages that prior authorization for Esomeprazole Magnesium 40MG  dr capsules is required/requested.   Insurance verification completed.   The patient is insured through [employer]  .   Per test claim: Prior authorization not required when submitted through [facility]. Test claim shows that its a transitional fill and that the maximum daily dose is 1 capsule. Will contact insurance tomorrow (3-18) to see if there is an issue in the system since it goes through with the limitations listed.

## 2023-03-30 NOTE — Telephone Encounter (Signed)
 Patient should be able to get a transitional fill for a co-pay of $6.00. PA will be started and documented in separate encounter

## 2023-03-31 ENCOUNTER — Other Ambulatory Visit (HOSPITAL_COMMUNITY): Payer: Self-pay

## 2023-04-01 ENCOUNTER — Other Ambulatory Visit (HOSPITAL_COMMUNITY): Payer: Self-pay

## 2023-04-06 ENCOUNTER — Ambulatory Visit (INDEPENDENT_AMBULATORY_CARE_PROVIDER_SITE_OTHER): Payer: Self-pay

## 2023-04-06 DIAGNOSIS — M9904 Segmental and somatic dysfunction of sacral region: Secondary | ICD-10-CM | POA: Diagnosis not present

## 2023-04-06 DIAGNOSIS — M4316 Spondylolisthesis, lumbar region: Secondary | ICD-10-CM | POA: Diagnosis not present

## 2023-04-06 DIAGNOSIS — R55 Syncope and collapse: Secondary | ICD-10-CM

## 2023-04-06 DIAGNOSIS — M9901 Segmental and somatic dysfunction of cervical region: Secondary | ICD-10-CM | POA: Diagnosis not present

## 2023-04-06 DIAGNOSIS — M4304 Spondylolysis, thoracic region: Secondary | ICD-10-CM | POA: Diagnosis not present

## 2023-04-08 ENCOUNTER — Telehealth: Payer: Self-pay | Admitting: Gastroenterology

## 2023-04-08 LAB — CUP PACEART REMOTE DEVICE CHECK
Date Time Interrogation Session: 20250325170040
Implantable Pulse Generator Implant Date: 20231218
Pulse Gen Model: 5000
Pulse Gen Serial Number: 511020630

## 2023-04-08 NOTE — Telephone Encounter (Signed)
 Manometry done and aperistalsis noted but manometry probe migrated up into the esophagus and GEJ could not be located, so unable to assess for achalasia. I had thought this was previously relayed to him but it is not clear to me that it has. Will touch  base with them

## 2023-04-09 NOTE — Telephone Encounter (Signed)
 Upon further review patient has been notified of the results (in EGD pathology result note), and he is scheduled to see me next month to discuss further.

## 2023-04-13 ENCOUNTER — Other Ambulatory Visit (HOSPITAL_COMMUNITY): Payer: Self-pay

## 2023-04-13 NOTE — Addendum Note (Signed)
 Addended by: Geralyn Flash D on: 04/13/2023 11:32 AM   Modules accepted: Orders

## 2023-04-13 NOTE — Progress Notes (Signed)
 Merlin [address] [employer]

## 2023-04-16 ENCOUNTER — Encounter: Payer: Self-pay | Admitting: Cardiovascular Disease

## 2023-04-23 DIAGNOSIS — M5416 Radiculopathy, lumbar region: Secondary | ICD-10-CM | POA: Diagnosis not present

## 2023-04-25 DIAGNOSIS — M5416 Radiculopathy, lumbar region: Secondary | ICD-10-CM | POA: Diagnosis not present

## 2023-04-27 ENCOUNTER — Other Ambulatory Visit (HOSPITAL_COMMUNITY): Payer: Self-pay

## 2023-04-27 ENCOUNTER — Telehealth: Payer: Self-pay

## 2023-04-27 NOTE — Telephone Encounter (Signed)
 PA request has been Submitted. New Encounter has been or will be created for follow up. For additional info see Pharmacy Prior Auth telephone encounter from 04-27-2023.

## 2023-04-27 NOTE — Telephone Encounter (Signed)
 Rec'd fax from [facility] regarding esomeprazole 40 mg caps - BID ac that "plan does not cover this medication.Aaron AasAaron AasAaron AasCall [phone] to initiate PA.  Patient ID [id number]". Please advise

## 2023-04-27 NOTE — Telephone Encounter (Signed)
 Pharmacy Patient Advocate Encounter   Received notification from Pt Calls Messages that prior authorization for Esomeprazole Magnesium 40MG  dr capsules is required/requested.   Insurance verification completed.   The patient is insured through [employer] .   Per test claim: PA required; PA submitted to above mentioned insurance via CoverMyMeds Key/confirmation #/EOC [id number] Status is pending

## 2023-04-28 DIAGNOSIS — K080 Exfoliation of teeth due to systemic causes: Secondary | ICD-10-CM | POA: Diagnosis not present

## 2023-04-28 NOTE — Telephone Encounter (Signed)
 Pharmacy Patient Advocate Encounter  Received notification from [facility] Medicare that Prior Authorization for Esomeprazole Magnesium 40MG  dr capsules has been APPROVED from 04-27-2023 to 04-26-2024   PA #/Case ID/Reference #: [id number]

## 2023-05-07 ENCOUNTER — Ambulatory Visit: Payer: Medicare Other | Admitting: Gastroenterology

## 2023-05-07 ENCOUNTER — Encounter: Payer: Self-pay | Admitting: Gastroenterology

## 2023-05-07 VITALS — BP 96/50 | HR 72 | Ht 66.0 in | Wt 172.0 lb

## 2023-05-07 DIAGNOSIS — K219 Gastro-esophageal reflux disease without esophagitis: Secondary | ICD-10-CM | POA: Diagnosis not present

## 2023-05-07 DIAGNOSIS — R1310 Dysphagia, unspecified: Secondary | ICD-10-CM | POA: Diagnosis not present

## 2023-05-07 DIAGNOSIS — Z79899 Other long term (current) drug therapy: Secondary | ICD-10-CM

## 2023-05-07 DIAGNOSIS — K2270 Barrett's esophagus without dysplasia: Secondary | ICD-10-CM | POA: Diagnosis not present

## 2023-05-07 MED ORDER — ESOMEPRAZOLE MAGNESIUM 40 MG PO CPDR: 40.0000 mg | DELAYED_RELEASE_CAPSULE | Freq: Two times a day (BID) | ORAL | 3 refills | Status: DC

## 2023-05-07 NOTE — Patient Instructions (Signed)
 We have sent the following medications to your pharmacy for you to pick up at your convenience: Nexium  40 mg twice daily.   We will refer you to [facility] esophageal dysmotility clinic. They will contact you directly with an appointment.   _______________________________________________________  If your blood pressure at your visit was 140/90 or greater, please contact your primary care physician to follow up on this.  _______________________________________________________  If you are age 45 or older, your body mass index should be between 23-30. Your Body mass index is 27.76 kg/m. If this is out of the aforementioned range listed, please consider follow up with your Primary Care Provider.  If you are age 47 or younger, your body mass index should be between 19-25. Your Body mass index is 27.76 kg/m. If this is out of the aformentioned range listed, please consider follow up with your Primary Care Provider.   ________________________________________________________  The [place] [facility] providers would like to encourage you to use MYCHART to communicate with providers for non-urgent requests or questions.  Due to long hold times on the telephone, sending your provider a message by [facility] may be a faster and more efficient way to get a response.  Please allow 48 business hours for a response.  Please remember that this is for non-urgent requests.  _______________________________________________________

## 2023-05-07 NOTE — Progress Notes (Signed)
 HPI :  78 year old male here for a follow-up visit today to discuss dysphagia, esophageal dysmotility, GERD, history of Barrett's.  Recall he does have a history of CAD with 3 stents in place, on Plavix , history of CHF, history of Roux-en-Y gastric bypass.   Recall he has had problems with reflux and dysphagia over time. Barium study in 2017 suggesting dysmotility.  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  He was referred to rheumatology at the time with a negative workup.  EGD was last done June 2023 he had a very dilated proximal esophagus with an angulated turn into the GEJ, with a short segment of Barrett's esophagus. July 2024 we repeated a barium study which showed significant dilation of his upper esophagus with stasis, suggestion of significant dysmotility.  He then had an attempt at esophageal manometry to ensure no evidence of achalasia, and he could not tolerate the manometry catheter.   Since his last visit we performed an EGD at the hospital in January.  He has a stable segment of Barrett's esophagus although given his abnormal anatomy it was difficult to assess landmarks to determine length of segment.  Biopsy showed stable Barrett's esophagus without dysplasia.  I then placed esophageal manometry probe down into his gastric pouch (history of gastric bypass).  We did this because he could not tolerate traditional placement of manometry catheter.  Unfortunately despite endoscopic placement, by the time he woke up the catheter migrated proximally out of the gastric pouch and the GEJ could not be assessed.  He has 100% aperistalsis/failed contraction, but could not assess for achalasia, as the GEJ was not crossed.  The LES appeared patent/no stenosis, however significant angulated turn into the gastric pouch and hiatal hernia.  He continues to eat both solids and liquids.  He has dysphagia to both that occur  sporadically.  He does feel tightness in his chest when he swallows and food and liquids can get hung up.  These are usually able to pass with time.  He has been taking PPI for history of reflux.  Previously on Protonix  which was not working very well, I tried to get him Dexilant  which was not covered by insurance.  He was put on a trial of Voquenza not which helped him quite a bit however his insurance required he failed additional PPIs before allowing him to get cloque is not approved.  He was placed on Nexium  40 mg twice daily.  He thinks this helped him much more than pantoprazole .  He ran out of it unfortunately and then there was some question about insurance coverage, he just had that refilled but only had a 30-day supply, he is asking for a 90-day supply of the Nexium  if it works better for him.  We otherwise had a lengthy discussion to determine how aggressive they want to be with further workup.  His symptoms of dysphagia sound stable over time although they are fairly frequent and this is an active issue for him.  He has not had any food impactions or hospitalizations for aspiration pneumonia etc.     PAST GI PROCEDURES:   EGD 07/10/2021: - Small hiatal hernia.  - Barrett's esophagus as outlined. Biopsied.  - Dilated / tortous esophagus with angulated turn at the GEJ which pooled secretions, consistent with underlying aperistalsis.  - Roux-en-Y gastrojejunostomy with gastrojejunal anastomosis characterized by healthy appearing mucosa.  - Normal examined small bowel limb. -  BARRETT'S ESOPHAGUS, NEGATIVE FOR DYSPLASIA   Colonoscopy 04/20/2014: Diverticulosis sigmoid colon Internal hemorrhoids    Esophageal manometry 02/02/2016: Normal relaxation of the EG junction A peristalsis concerning for possible scleroderma esophagus or mixed connective tissue disorder Findings are not consistent with achalasia   Barium swallow study 06/15/2015: 1. Abnormal markedly dilated and fixed esophagus  with absent esophageal contractions/motility. 2. Associated gastroesophageal reflux. 3. Some associated mass effect from the proximal dilated esophagus on the lower cervical esophagus with concentric narrowing at the junction of the cervical and thoracic esophagus, however, at no functional obstruction noted, and the barium tablet passed without difficulty. 4. Superimposed changes of gastric bypass/gastrojejunostomy. 5. The findings were reviewed at the conclusion of this study with the patient. This esophageal abnormality would seem to be very longstanding, and it is unclear whether this is related to the patient cough symptoms. As far as the underlying etiology, the top differential considerations would include connective tissue disease and sequelae of chronic severe esophageal spasm.     Barium swallow 07/31/22: FINDINGS: Single-contrast exam performed with the patient in RAO position. Mild limitations secondary to patient immobility and difficulty with multiple swallows.   Evaluation of primary peristalsis demonstrates lack of normal primary peristaltic wave beyond the upper thoracic esophagus.   With multiple swallows, there is marked contrast stasis within a moderate to markedly dilated upper thoracic esophagus. No passage of contrast with patient prone. With upright positioning, there is eventual passage of contrast into a more normal caliber distal esophagus in the setting of probable small hiatal hernia. Status post gastric bypass, incompletely and suboptimally evaluated.   IMPRESSION: 1. Mild limitations as detailed above. 2. Incomplete primary peristaltic wave beyond the level of the upper thoracic esophagus. Contrast stasis/lack of passage on prone positioning within a moderate to markedly dilated upper and mid esophagus. Eventual filling of a more normal caliber distal esophagus with upright positioning. No well-defined stricture in the site of transition.  Therefore, this contrast stasis is favored to be secondary to severe esophageal dysmotility. 3. Small hiatal hernia in the setting of prior gastric bypass.     Manometry attempted 10/23 but could not pass catheter - patient intolerance   EGD 02/02/23: Esophagogastric landmarks were identified: the gastroesophageal junction was found at 36 cm from the incisors. Landmarks hard to measure given history of roux-en-y, hiatal hernia, and markedly angulated turn into the GEJ. The LES appeared widely patent, in part due to hiatal hernia. Findings: A small hiatal hernia was present. The Z-line was irregular - there is a suspected short segment of Barrett's roughly 2-3cm or so. Biopsies were taken with a cold forceps for histology. No nodularity seen. The lumen of the esophagus was dilated with a markedly angulated turn in the distal esophagus entering the hernia sac / gastric pouch (sigmoid esophagus). Changes of stasis were seen. Manometry catheter was easily placed from the right nare into the esophagus. It was not easy however to traverse large angulated turn and get the catheter to go into the gastric pouch. A snare was used to gently grasp the very distal end of the manometry catheter and to place it endoscopically in the gastric pouch. Evidence of a Roux-en-Y gastrojejunostomy was found. The gastrojejunal anastomosis was characterized by healthy appearing mucosa. Gastric pouch was small, could not retroflex. The examined small bowel limb and blind limb were normal.  FINAL MICROSCOPIC DIAGNOSIS:   A. ESOPHAGEAL, BIOPSY:  Intestinal metaplasia consistent with Barrett's esophagus.  Negative for dysplasia.   Esophageal manometry 02/02/23:  Unable to appreciate GEJ - manometry catheter migrated proximally above the GEJ 100% failed swallows - absent peristalsis   Past Medical History:  Diagnosis Date   Allergic rhinitis    Barrett's esophagus    Cataract    Coronary artery disease    Stent x1 2008    Dysphagia    Esophageal dysmotility    GERD (gastroesophageal reflux disease)    Hyperlipidemia    Hypertension    Syncope    on Keppra     Past Surgical History:  Procedure Laterality Date   ABDOMINAL HERNIA REPAIR     BIOPSY  02/02/2023   Procedure: BIOPSY;  Surgeon: Ace Holder, MD;  Location: [facility];  Service: Gastroenterology;;   CARDIAC CATHETERIZATION     stent   CORONARY ANGIOPLASTY WITH STENT PLACEMENT  2008   stent x1   ESOPHAGEAL MANOMETRY N/A 02/06/2016   Procedure: ESOPHAGEAL MANOMETRY (EM);  Surgeon: Danette Duos, MD;  Location: [facility];  Service: Gastroenterology;  Laterality: N/A;   ESOPHAGEAL MANOMETRY N/A 02/02/2023   Procedure: ESOPHAGEAL MANOMETRY (EM);  Surgeon: Ace Holder, MD;  Location: [facility];  Service: Gastroenterology;  Laterality: N/A;   ESOPHAGEAL MANOMETRY N/A 02/02/2023   Procedure: ESOPHAGEAL MANOMETRY (EM);  Surgeon: Sergio Dandy, MD;  Location: [facility];  Service: Gastroenterology;  Laterality: N/A;   ESOPHAGOGASTRODUODENOSCOPY (EGD) WITH PROPOFOL  N/A 02/02/2023   Procedure: ESOPHAGOGASTRODUODENOSCOPY (EGD) WITH PROPOFOL ;  Surgeon: Ace Holder, MD;  Location: [facility];  Service: Gastroenterology;  Laterality: N/A;  with placement of manometry probe   GASTRIC BYPASS  2005   GASTRIC BYPASS  2005   ROTATOR CUFF REPAIR Left 2007   TOTAL KNEE ARTHROPLASTY Bilateral    Family History  Problem Relation Age of Onset   Colon polyps Mother 17   Breast cancer Mother 60   Diabetes Mother    Heart disease Mother    Colon polyps Father    Prostate cancer Father 58   Colon cancer Father 63   Heart disease Father    Breast cancer Sister 35   Diabetes Sister    Lung disease Neg Hx    Esophageal cancer Neg Hx    Social History   Tobacco Use   Smoking status: Former    Current packs/day: 0.00    Average packs/day: 1 pack/day for 18.3 years (18.3 ttl pk-yrs)    Types: Cigarettes     Start date: 02/09/1964    Quit date: 05/23/1982    Years since quitting: 40.9   Smokeless tobacco: Never   Tobacco comments:    Also smoked 4-5 cigars for 10 years after cigarettes  Vaping Use   Vaping status: Former  Substance Use Topics   Alcohol use: Not Currently    Comment: 1 glass wine a month   Drug use: No   Current Outpatient Medications  Medication Sig Dispense Refill   ascorbic acid (VITAMIN C) 500 MG tablet Take 500 mg by mouth 2 (two) times daily.     atorvastatin  (LIPITOR) 40 MG tablet Take 1 tablet (40 mg total) by mouth daily. 90 tablet 3   Cholecalciferol (VITAMIN D3) 250 MCG (10000 UT) TABS Take 1,000 Units by mouth in the morning and at bedtime.     clopidogrel  (PLAVIX ) 75 MG tablet TAKE 1 TABLET(75 MG) BY MOUTH DAILY 90 tablet 2   CO-ENZYME Q-10 PO Take 200 mg by mouth daily.     ferrous sulfate 325 (65 FE) MG tablet Take 325 mg by  mouth daily.     levETIRAcetam (KEPPRA) 750 MG tablet Take 750 mg by mouth 2 (two) times daily.     Multiple Vitamins-Minerals (PRESERVISION AREDS 2 PO) Take 1 capsule by mouth 2 (two) times daily.     primidone  (MYSOLINE ) 50 MG tablet Take 1 tablet (50 mg total) by mouth 2 (two) times daily. (Patient taking differently: Take 50 mg by mouth as directed. 50 MG IN THE AM AND 100 IN THE PM) 180 tablet 1   sacubitril -valsartan  (ENTRESTO ) 24-26 MG Take 1 tablet by mouth 2 (two) times daily. 180 tablet 3   topiramate (TOPAMAX) 50 MG tablet Take 1 tablet by mouth 2 (two) times daily.     esomeprazole  (NEXIUM ) 40 MG capsule Take 1 capsule (40 mg total) by mouth 2 (two) times daily before a meal. 180 capsule 3   Current Facility-Administered Medications  Medication Dose Route Frequency Provider Last Rate Last Admin   0.9 %  sodium chloride  infusion  500 mL Intravenous Continuous Maximillion Gill, Lendon Queen, MD       Allergies  Allergen Reactions   Sulfa Antibiotics Other (See Comments)    As child; unknown   Multihance  [Gadobenate] Hives    1  singular hive post contrast    Pedi-Pre Tape Spray [Wound Dressing Adhesive]    Tetanus Toxoids     The horse serum for tetanus As a child     Review of Systems: All systems reviewed and negative except where noted in HPI.    CUP PACEART REMOTE DEVICE CHECK Result Date: 04/08/2023 ILR summary report received. Battery status OK. Normal device function. No new symptom, tachy, brady, or pause episodes. No new AF episodes. Monthly summary reports and ROV/PRN LA, CVRS   Physical Exam: BP (!) 96/50   Pulse 72   Ht 5\' 6"  (1.676 m)   Wt 172 lb (78 kg)   SpO2 96%   BMI 27.76 kg/m  Constitutional: Pleasant,well-developed, male in no acute distress. Neurological: Alert and oriented to person place and time. Psychiatric: Normal mood and affect. Behavior is normal.   ASSESSMENT: 78 y.o. male here for assessment of the following  1. Dysphagia, unspecified type   2. Gastroesophageal reflux disease, unspecified whether esophagitis present   3. Barrett's esophagus without dysplasia   4. Long-term current use of proton pump inhibitor therapy    Had a good discussion with the patient and spouse today about long-term plan.  His anatomy is altered due to his Roux-en-Y and hiatal hernia, he does have what appears to be stable segment of Barrett's esophagus without dysplasia.  He has severe esophageal dysmotility with aperistalsis.  Prior rheumatic workup negative.  At least remotely on prior manometry he did not have achalasia, however there is concern for that more recently.  He did not tolerate a repeat attempt at traditional esophageal manometry, I was able to place the manometry catheter endoscopically in January, unfortunately the catheter migrated up above the GEJ so cannot rule out achalasia.  He has fairly frequent symptoms of dysphagia to both solids and liquids.  If he did have achalasia potentially this could be treated and help his symptoms, versus if this is just aperistalsis his  treatment options are quite limited.  Discussed with him if he wanted to be evaluated at a tertiary care center for Endoflip, further assess for achalasia.  We discussed what this would entail.  Concerned about long-term interval worsening, risks for aspiration, impaction etc.  Would be good to know exactly what we  are dealing with in regards to this diagnosis.  Following good discussion he is agreeable to be referred to [facility] to the motility center for Endoflip, rule out achalasia.  Will place that referral today.  Otherwise we discussed his Barrett's esophagus which appears stable.  He has failed a few PPIs but fortunately doing much better on Nexium .  Insurance failed to cover Voquezna .  Will continue Nexium  40 mg twice daily for now.  I discussed the long-term risks and benefits of chronic PPI use with him.  I feel that benefits greatly exceed risks in his situation given his symptoms and Barrett's esophagus.  He will continue that for now.  If he loses response to Nexium  he will contact me to discuss other options.  PLAN: - refill nexium  for 90 days - counseled on risks/ benefits of chronic PPI. If issues with this then will try to pursue Voquezna  again as he would have since failed multiple PPIs - referral to [facility] for dysmotility - endoflip request - rule out achalasia  Christi Coward, MD [facility]astroenter[facility]

## 2023-05-11 ENCOUNTER — Ambulatory Visit (INDEPENDENT_AMBULATORY_CARE_PROVIDER_SITE_OTHER): Payer: Self-pay

## 2023-05-11 DIAGNOSIS — R55 Syncope and collapse: Secondary | ICD-10-CM | POA: Diagnosis not present

## 2023-05-11 LAB — CUP PACEART REMOTE DEVICE CHECK
Date Time Interrogation Session: 20250428080210
Implantable Pulse Generator Implant Date: 20231218
Pulse Gen Model: 5000
Pulse Gen Serial Number: 511020630

## 2023-05-19 ENCOUNTER — Telehealth: Payer: Self-pay

## 2023-05-19 DIAGNOSIS — I2510 Atherosclerotic heart disease of native coronary artery without angina pectoris: Secondary | ICD-10-CM

## 2023-05-19 DIAGNOSIS — I209 Angina pectoris, unspecified: Secondary | ICD-10-CM

## 2023-05-19 DIAGNOSIS — R0789 Other chest pain: Secondary | ICD-10-CM

## 2023-05-19 MED ORDER — NITROGLYCERIN 0.4 MG SL SUBL: SUBLINGUAL_TABLET | SUBLINGUAL | 6 refills | Status: DC

## 2023-05-19 NOTE — Telephone Encounter (Signed)
 Per secure chat via [employer]ah[employer]: Chalice Colt is having some chest pressure on occasion .  Will you send a script for ntg. 0.4 prn to [facility] [address] .  Also let get a lexiscan myoview  Medication sent to pharmacy on file. Lexiscan entered and routed to MD to sign attestation.

## 2023-05-20 ENCOUNTER — Ambulatory Visit
Admission: RE | Admit: 2023-05-20 | Discharge: 2023-05-20 | Disposition: A | Discharge status: Home / Self Care | Source: Ambulatory Visit | Attending: Registered Nurse | Admitting: Registered Nurse

## 2023-05-20 ENCOUNTER — Other Ambulatory Visit: Payer: Self-pay | Admitting: Registered Nurse

## 2023-05-20 DIAGNOSIS — K5792 Diverticulitis of intestine, part unspecified, without perforation or abscess without bleeding: Secondary | ICD-10-CM

## 2023-05-20 DIAGNOSIS — K8020 Calculus of gallbladder without cholecystitis without obstruction: Secondary | ICD-10-CM | POA: Diagnosis not present

## 2023-05-20 DIAGNOSIS — R1032 Left lower quadrant pain: Secondary | ICD-10-CM | POA: Diagnosis not present

## 2023-05-20 DIAGNOSIS — R911 Solitary pulmonary nodule: Secondary | ICD-10-CM | POA: Diagnosis not present

## 2023-05-20 DIAGNOSIS — K5730 Diverticulosis of large intestine without perforation or abscess without bleeding: Secondary | ICD-10-CM | POA: Diagnosis not present

## 2023-05-20 DIAGNOSIS — K449 Diaphragmatic hernia without obstruction or gangrene: Secondary | ICD-10-CM | POA: Diagnosis not present

## 2023-05-20 MED ORDER — IOPAMIDOL (ISOVUE-300) INJECTION 61%
100.0000 mL | Freq: Once | INTRAVENOUS | Status: AC | PRN
  Administered 2023-05-20: 100 mL via INTRAVENOUS

## 2023-05-25 ENCOUNTER — Other Ambulatory Visit: Payer: Self-pay | Admitting: Cardiovascular Disease

## 2023-05-25 ENCOUNTER — Encounter: Payer: Self-pay | Admitting: Cardiovascular Disease

## 2023-05-25 DIAGNOSIS — L821 Other seborrheic keratosis: Secondary | ICD-10-CM | POA: Diagnosis not present

## 2023-05-25 DIAGNOSIS — C44622 Squamous cell carcinoma of skin of right upper limb, including shoulder: Secondary | ICD-10-CM | POA: Diagnosis not present

## 2023-05-25 DIAGNOSIS — L111 Transient acantholytic dermatosis [Grover]: Secondary | ICD-10-CM | POA: Diagnosis not present

## 2023-05-25 DIAGNOSIS — L820 Inflamed seborrheic keratosis: Secondary | ICD-10-CM | POA: Diagnosis not present

## 2023-05-25 DIAGNOSIS — L570 Actinic keratosis: Secondary | ICD-10-CM | POA: Diagnosis not present

## 2023-05-25 DIAGNOSIS — I2510 Atherosclerotic heart disease of native coronary artery without angina pectoris: Secondary | ICD-10-CM

## 2023-05-25 DIAGNOSIS — I209 Angina pectoris, unspecified: Secondary | ICD-10-CM

## 2023-05-25 DIAGNOSIS — R0789 Other chest pain: Secondary | ICD-10-CM

## 2023-05-25 DIAGNOSIS — Z85828 Personal history of other malignant neoplasm of skin: Secondary | ICD-10-CM | POA: Diagnosis not present

## 2023-05-26 NOTE — Progress Notes (Signed)
 Merlin [address] [employer]

## 2023-05-26 NOTE — Addendum Note (Signed)
 Addended by: Edra Govern D on: 05/26/2023 10:11 AM   Modules accepted: Orders

## 2023-05-28 ENCOUNTER — Ambulatory Visit (HOSPITAL_COMMUNITY): Attending: Cardiovascular Disease

## 2023-05-28 ENCOUNTER — Telehealth: Payer: Self-pay | Admitting: Gastroenterology

## 2023-05-28 DIAGNOSIS — R0789 Other chest pain: Secondary | ICD-10-CM | POA: Insufficient documentation

## 2023-05-28 DIAGNOSIS — I2510 Atherosclerotic heart disease of native coronary artery without angina pectoris: Secondary | ICD-10-CM | POA: Diagnosis not present

## 2023-05-28 DIAGNOSIS — I25119 Atherosclerotic heart disease of native coronary artery with unspecified angina pectoris: Secondary | ICD-10-CM | POA: Insufficient documentation

## 2023-05-28 DIAGNOSIS — I700 Atherosclerosis of aorta: Secondary | ICD-10-CM | POA: Diagnosis not present

## 2023-05-28 DIAGNOSIS — I209 Angina pectoris, unspecified: Secondary | ICD-10-CM | POA: Insufficient documentation

## 2023-05-28 MED ORDER — TECHNETIUM TC 99M TETROFOSMIN IV KIT
10.6000 | PACK | Freq: Once | INTRAVENOUS | Status: AC | PRN
Start: 2023-05-28 — End: 2023-05-28
  Administered 2023-05-28: 10.6 via INTRAVENOUS

## 2023-05-28 MED ORDER — TECHNETIUM TC 99M TETROFOSMIN IV KIT
30.4000 | PACK | Freq: Once | INTRAVENOUS | Status: AC | PRN
  Administered 2023-05-28: 30.4 via INTRAVENOUS

## 2023-05-28 MED ORDER — REGADENOSON 0.4 MG/5ML IV SOLN
0.4000 mg | Freq: Once | INTRAVENOUS | Status: AC
  Administered 2023-05-28: 0.4 mg via INTRAVENOUS

## 2023-05-28 MED ORDER — REGADENOSON 0.4 MG/5ML IV SOLN
INTRAVENOUS | Status: AC
  Filled 2023-05-28: qty 5

## 2023-05-28 NOTE — Telephone Encounter (Signed)
 Ian [name],   Looks like this referral was placed at the time of pt's last office visit.   Thanks,  Morse Brueggemann, LPN

## 2023-05-28 NOTE — Telephone Encounter (Signed)
 Informed patient that I spoke to [facility] on Tuesday regarding a update on the referral. They informed me they have reviewed the referral and it is ready for scheduling. That they will reach out to the patient soon to schedule. Informed patient if he does not hear from them by Friday, to contact me and I will reach back out to them. Patient verbalized understanding.

## 2023-05-28 NOTE — Telephone Encounter (Signed)
 PT is calling to get an update on if referral had been sent to [facility] esophageal dysmotility clinic. Please advise.

## 2023-05-29 ENCOUNTER — Ambulatory Visit: Payer: Self-pay | Admitting: Cardiovascular Disease

## 2023-05-29 LAB — MYOCARDIAL PERFUSION IMAGING
LV dias vol: 149 mL (ref 62–150)
LV sys vol: 79 mL
Nuc Stress EF: 47 %
Peak HR: 83 {beats}/min
Rest HR: 48 {beats}/min
Rest Nuclear Isotope Dose: 10.6 mCi
SDS: 1
SRS: 5
SSS: 5
ST Depression (mm): 0 mm
Stress Nuclear Isotope Dose: 30.4 mCi
TID: 1.1

## 2023-06-01 NOTE — Telephone Encounter (Signed)
 Called [facility] esophageal dysmotility to check on status of referral. Merita states the referral is still under review. They will contact the patient to schedule after it's approved. Informed Merita that a gentleman I spoke with last week told me that it was ready to schedule. Merita apologized and states the referral is under review still. Informed patient that the referral is under review. Patient verbalized understanding.

## 2023-06-09 ENCOUNTER — Ambulatory Visit: Payer: Self-pay | Admitting: Internal Medicine

## 2023-06-11 ENCOUNTER — Ambulatory Visit (INDEPENDENT_AMBULATORY_CARE_PROVIDER_SITE_OTHER): Payer: Self-pay

## 2023-06-11 DIAGNOSIS — R55 Syncope and collapse: Secondary | ICD-10-CM | POA: Diagnosis not present

## 2023-06-11 LAB — CUP PACEART REMOTE DEVICE CHECK
Date Time Interrogation Session: 20250529050114
Implantable Pulse Generator Implant Date: 20231218
Pulse Gen Model: 5000
Pulse Gen Serial Number: 511020630

## 2023-06-15 ENCOUNTER — Ambulatory Visit: Payer: Self-pay

## 2023-06-17 ENCOUNTER — Ambulatory Visit: Payer: Self-pay | Admitting: Cardiovascular Disease

## 2023-06-18 NOTE — Telephone Encounter (Signed)
 Called [facility] to check the status of the referral. Tori states that the scheduling team made comments on the referral notes that the patient was ready to be scheduled with an esophageal motility provider. Jyl Or states they will reach out to the patient soon.

## 2023-06-25 DIAGNOSIS — G250 Essential tremor: Secondary | ICD-10-CM | POA: Diagnosis not present

## 2023-06-25 DIAGNOSIS — G40909 Epilepsy, unspecified, not intractable, without status epilepticus: Secondary | ICD-10-CM | POA: Diagnosis not present

## 2023-07-02 NOTE — Progress Notes (Signed)
 Merlin [address] [employer]

## 2023-07-13 ENCOUNTER — Ambulatory Visit: Payer: Self-pay

## 2023-07-13 DIAGNOSIS — R55 Syncope and collapse: Secondary | ICD-10-CM | POA: Diagnosis not present

## 2023-07-14 LAB — CUP PACEART REMOTE DEVICE CHECK
Date Time Interrogation Session: 20250630150025
Implantable Pulse Generator Implant Date: 20231218
Pulse Gen Model: 5000
Pulse Gen Serial Number: 511020630

## 2023-07-19 ENCOUNTER — Ambulatory Visit: Payer: Self-pay | Admitting: Cardiovascular Disease

## 2023-07-20 ENCOUNTER — Ambulatory Visit: Payer: Self-pay

## 2023-07-31 NOTE — Progress Notes (Signed)
 Carelink Summary Report / Loop Recorder

## 2023-08-13 ENCOUNTER — Ambulatory Visit (INDEPENDENT_AMBULATORY_CARE_PROVIDER_SITE_OTHER): Payer: Self-pay

## 2023-08-13 DIAGNOSIS — R55 Syncope and collapse: Secondary | ICD-10-CM | POA: Diagnosis not present

## 2023-08-14 LAB — CUP PACEART REMOTE DEVICE CHECK
Date Time Interrogation Session: 20250731120026
Implantable Pulse Generator Implant Date: 20231218
Pulse Gen Model: 5000
Pulse Gen Serial Number: 511020630

## 2023-08-18 ENCOUNTER — Telehealth: Payer: Self-pay | Admitting: Gastroenterology

## 2023-08-18 NOTE — Telephone Encounter (Signed)
 Patient called requesting a list of all the medications he has been tried on over the years by GI so he can present it to the physician he is scheduled to see at [facility]. Patient is advised that [facility] actually has access to all of our records through care everywhere so should be able to view this information prior to his office visit with them. Patient verbalizes understanding.

## 2023-08-18 NOTE — Telephone Encounter (Signed)
 Inbound call from patient states he has an upcoming apt with [facility] an d would like to discuss speak with a nurse in regards. Please advise.   Thank you

## 2023-08-24 ENCOUNTER — Ambulatory Visit: Payer: Self-pay

## 2023-08-25 ENCOUNTER — Ambulatory Visit: Payer: Self-pay | Admitting: Cardiovascular Disease

## 2023-08-25 DIAGNOSIS — K2270 Barrett's esophagus without dysplasia: Secondary | ICD-10-CM | POA: Diagnosis not present

## 2023-08-25 DIAGNOSIS — K220 Achalasia of cardia: Secondary | ICD-10-CM | POA: Diagnosis not present

## 2023-08-25 DIAGNOSIS — Z9884 Bariatric surgery status: Secondary | ICD-10-CM | POA: Diagnosis not present

## 2023-08-25 DIAGNOSIS — R1319 Other dysphagia: Secondary | ICD-10-CM | POA: Diagnosis not present

## 2023-09-09 ENCOUNTER — Other Ambulatory Visit: Payer: Self-pay | Admitting: Physician Assistant

## 2023-09-10 DIAGNOSIS — M5416 Radiculopathy, lumbar region: Secondary | ICD-10-CM | POA: Diagnosis not present

## 2023-09-10 MED ORDER — ATORVASTATIN CALCIUM 40 MG PO TABS: 40.0000 mg | ORAL_TABLET | Freq: Every day | ORAL | 1 refills | Status: DC

## 2023-09-14 ENCOUNTER — Ambulatory Visit (INDEPENDENT_AMBULATORY_CARE_PROVIDER_SITE_OTHER): Payer: Self-pay

## 2023-09-14 DIAGNOSIS — R55 Syncope and collapse: Secondary | ICD-10-CM

## 2023-09-16 LAB — CUP PACEART REMOTE DEVICE CHECK
Date Time Interrogation Session: 20250831054132
Implantable Pulse Generator Implant Date: 20231218
Pulse Gen Model: 5000
Pulse Gen Serial Number: 511020630

## 2023-09-17 ENCOUNTER — Ambulatory Visit: Payer: Self-pay | Admitting: Cardiovascular Disease

## 2023-09-22 NOTE — Progress Notes (Signed)
 Remote Loop Recorder Transmission

## 2023-09-23 DIAGNOSIS — K224 Dyskinesia of esophagus: Secondary | ICD-10-CM | POA: Diagnosis not present

## 2023-09-23 DIAGNOSIS — Z9884 Bariatric surgery status: Secondary | ICD-10-CM | POA: Diagnosis not present

## 2023-09-23 DIAGNOSIS — K220 Achalasia of cardia: Secondary | ICD-10-CM | POA: Diagnosis not present

## 2023-09-28 ENCOUNTER — Ambulatory Visit: Payer: Self-pay

## 2023-09-28 ENCOUNTER — Other Ambulatory Visit: Payer: Self-pay | Admitting: Physician Assistant

## 2023-09-28 DIAGNOSIS — E538 Deficiency of other specified B group vitamins: Secondary | ICD-10-CM | POA: Diagnosis not present

## 2023-09-28 DIAGNOSIS — Z125 Encounter for screening for malignant neoplasm of prostate: Secondary | ICD-10-CM | POA: Diagnosis not present

## 2023-09-28 DIAGNOSIS — E559 Vitamin D deficiency, unspecified: Secondary | ICD-10-CM | POA: Diagnosis not present

## 2023-09-28 DIAGNOSIS — I130 Hypertensive heart and chronic kidney disease with heart failure and stage 1 through stage 4 chronic kidney disease, or unspecified chronic kidney disease: Secondary | ICD-10-CM | POA: Diagnosis not present

## 2023-09-28 DIAGNOSIS — E7849 Other hyperlipidemia: Secondary | ICD-10-CM | POA: Diagnosis not present

## 2023-09-29 MED ORDER — CLOPIDOGREL BISULFATE 75 MG PO TABS: 75.0000 mg | ORAL_TABLET | Freq: Every day | ORAL | 0 refills | Status: DC

## 2023-10-05 DIAGNOSIS — Z1339 Encounter for screening examination for other mental health and behavioral disorders: Secondary | ICD-10-CM | POA: Diagnosis not present

## 2023-10-05 DIAGNOSIS — I130 Hypertensive heart and chronic kidney disease with heart failure and stage 1 through stage 4 chronic kidney disease, or unspecified chronic kidney disease: Secondary | ICD-10-CM | POA: Diagnosis not present

## 2023-10-05 DIAGNOSIS — Z0000 Encounter for general adult medical examination without abnormal findings: Secondary | ICD-10-CM | POA: Diagnosis not present

## 2023-10-05 DIAGNOSIS — Z23 Encounter for immunization: Secondary | ICD-10-CM | POA: Diagnosis not present

## 2023-10-05 DIAGNOSIS — Z1331 Encounter for screening for depression: Secondary | ICD-10-CM | POA: Diagnosis not present

## 2023-10-05 DIAGNOSIS — R82998 Other abnormal findings in urine: Secondary | ICD-10-CM | POA: Diagnosis not present

## 2023-10-13 NOTE — Progress Notes (Signed)
 Remote Loop Recorder Transmission

## 2023-10-14 NOTE — Progress Notes (Signed)
 Remote Loop Recorder Transmission

## 2023-10-15 ENCOUNTER — Ambulatory Visit (INDEPENDENT_AMBULATORY_CARE_PROVIDER_SITE_OTHER): Payer: Self-pay

## 2023-10-15 DIAGNOSIS — I447 Left bundle-branch block, unspecified: Secondary | ICD-10-CM

## 2023-10-15 LAB — CUP PACEART REMOTE DEVICE CHECK
Date Time Interrogation Session: 20251002070728
Implantable Pulse Generator Implant Date: 20231218
Pulse Gen Model: 5000
Pulse Gen Serial Number: 511020630

## 2023-10-16 NOTE — Progress Notes (Signed)
 Remote Loop Recorder Transmission

## 2023-10-26 ENCOUNTER — Ambulatory Visit: Payer: Self-pay | Admitting: Cardiovascular Disease

## 2023-10-26 DIAGNOSIS — C44212 Basal cell carcinoma of skin of right ear and external auricular canal: Secondary | ICD-10-CM | POA: Diagnosis not present

## 2023-10-26 DIAGNOSIS — C4442 Squamous cell carcinoma of skin of scalp and neck: Secondary | ICD-10-CM | POA: Diagnosis not present

## 2023-10-26 DIAGNOSIS — D1801 Hemangioma of skin and subcutaneous tissue: Secondary | ICD-10-CM | POA: Diagnosis not present

## 2023-10-26 DIAGNOSIS — D692 Other nonthrombocytopenic purpura: Secondary | ICD-10-CM | POA: Diagnosis not present

## 2023-10-26 DIAGNOSIS — Z85828 Personal history of other malignant neoplasm of skin: Secondary | ICD-10-CM | POA: Diagnosis not present

## 2023-10-26 DIAGNOSIS — L570 Actinic keratosis: Secondary | ICD-10-CM | POA: Diagnosis not present

## 2023-10-26 DIAGNOSIS — L821 Other seborrheic keratosis: Secondary | ICD-10-CM | POA: Diagnosis not present

## 2023-11-04 ENCOUNTER — Telehealth: Payer: Self-pay | Admitting: Gastroenterology

## 2023-11-04 NOTE — Telephone Encounter (Signed)
 PT is calling to speak with a nurse regarding his medications. Please advise.

## 2023-11-04 NOTE — Telephone Encounter (Signed)
 Yes, absolutely, if Voquezna  works better for him then lets see if his insurance will cover it at this point in time, if you can submit a prescription.  Thanks

## 2023-11-04 NOTE — Telephone Encounter (Signed)
 Pt reports he treid voquezna  in the past and it worked but his insurance would not cover it. Pt states he is currently taking Nexium . He has spoken with his insurance company and right now he has met his out of pocket. Insurance dept he was speaking with suggested since he has met his deductible to have the doctor call in a 90 days supply of voquezna . [facility] [address]. Please advise if ok to send in script.

## 2023-11-04 NOTE — Telephone Encounter (Signed)
 What dose would you like for him to have?

## 2023-11-04 NOTE — Telephone Encounter (Signed)
 Sorry - to clarify 10mg  / day. thanks

## 2023-11-05 ENCOUNTER — Other Ambulatory Visit: Payer: Self-pay

## 2023-11-05 MED ORDER — VOQUEZNA 10 MG PO TABS: 10.0000 mg | ORAL_TABLET | Freq: Every day | ORAL | 1 refills | Status: DC

## 2023-11-05 NOTE — Telephone Encounter (Signed)
Prescription sent to pharmacy and pt aware. 

## 2023-11-16 ENCOUNTER — Ambulatory Visit: Payer: Self-pay

## 2023-11-16 DIAGNOSIS — R55 Syncope and collapse: Secondary | ICD-10-CM

## 2023-11-16 LAB — CUP PACEART REMOTE DEVICE CHECK
Date Time Interrogation Session: 20251103045256
Implantable Pulse Generator Implant Date: 20231218
Pulse Gen Model: 5000
Pulse Gen Serial Number: 511020630

## 2023-11-17 ENCOUNTER — Telehealth: Payer: Self-pay

## 2023-11-17 ENCOUNTER — Other Ambulatory Visit (HOSPITAL_COMMUNITY): Payer: Self-pay

## 2023-11-17 NOTE — Telephone Encounter (Signed)
 Pharmacy Patient Advocate Encounter   Received notification from CoverMyMeds that prior authorization for Voquezna  10MG  tablets is required/requested.   Insurance verification completed.   The patient is insured through [employer].   Per test claim: PA required; PA submitted to above mentioned insurance via Latent Key/confirmation #/EOC [id number] Status is pending

## 2023-11-18 ENCOUNTER — Other Ambulatory Visit (HOSPITAL_COMMUNITY): Payer: Self-pay

## 2023-11-18 NOTE — Telephone Encounter (Signed)
 Pharmacy Patient Advocate Encounter  Received notification from [facility] [employer] that Prior Authorization for Voquezna  10MG  tablets has been APPROVED from 11-18-2023 to 11-16-2024. Ran test claim, Copay is $0.00 for a 90 day supply. This test claim was processed through [facility]- copay amounts may vary at other pharmacies due to pharmacy/plan contracts, or as the patient moves through the different stages of their insurance plan.   PA #/Case ID/Reference #: [id number]

## 2023-11-18 NOTE — Progress Notes (Signed)
 Remote Loop Recorder Transmission

## 2023-11-19 ENCOUNTER — Ambulatory Visit: Payer: Self-pay | Admitting: Cardiovascular Disease

## 2023-11-23 DIAGNOSIS — M9904 Segmental and somatic dysfunction of sacral region: Secondary | ICD-10-CM | POA: Diagnosis not present

## 2023-11-23 DIAGNOSIS — M4304 Spondylolysis, thoracic region: Secondary | ICD-10-CM | POA: Diagnosis not present

## 2023-11-23 DIAGNOSIS — M4316 Spondylolisthesis, lumbar region: Secondary | ICD-10-CM | POA: Diagnosis not present

## 2023-11-23 DIAGNOSIS — M9901 Segmental and somatic dysfunction of cervical region: Secondary | ICD-10-CM | POA: Diagnosis not present

## 2023-11-26 DIAGNOSIS — M4304 Spondylolysis, thoracic region: Secondary | ICD-10-CM | POA: Diagnosis not present

## 2023-11-26 DIAGNOSIS — M9904 Segmental and somatic dysfunction of sacral region: Secondary | ICD-10-CM | POA: Diagnosis not present

## 2023-11-26 DIAGNOSIS — M9901 Segmental and somatic dysfunction of cervical region: Secondary | ICD-10-CM | POA: Diagnosis not present

## 2023-11-26 DIAGNOSIS — M4316 Spondylolisthesis, lumbar region: Secondary | ICD-10-CM | POA: Diagnosis not present

## 2023-12-03 DIAGNOSIS — K080 Exfoliation of teeth due to systemic causes: Secondary | ICD-10-CM | POA: Diagnosis not present

## 2023-12-06 ENCOUNTER — Other Ambulatory Visit: Payer: Self-pay | Admitting: Nurse Practitioner

## 2023-12-17 ENCOUNTER — Ambulatory Visit: Payer: Self-pay

## 2023-12-17 DIAGNOSIS — I447 Left bundle-branch block, unspecified: Secondary | ICD-10-CM | POA: Diagnosis not present

## 2023-12-19 LAB — CUP PACEART REMOTE DEVICE CHECK
Date Time Interrogation Session: 20251204050156
Implantable Pulse Generator Implant Date: 20231218
Pulse Gen Model: 5000
Pulse Gen Serial Number: 511020630

## 2023-12-22 NOTE — Progress Notes (Signed)
 Remote Loop Recorder Transmission

## 2023-12-31 ENCOUNTER — Ambulatory Visit: Payer: Self-pay | Admitting: Cardiovascular Disease

## 2024-01-01 ENCOUNTER — Other Ambulatory Visit: Payer: Self-pay | Admitting: Physician Assistant

## 2024-01-18 ENCOUNTER — Ambulatory Visit: Payer: Self-pay

## 2024-01-18 ENCOUNTER — Other Ambulatory Visit: Payer: Self-pay | Admitting: Physician Assistant

## 2024-01-18 DIAGNOSIS — I447 Left bundle-branch block, unspecified: Secondary | ICD-10-CM

## 2024-01-18 LAB — CUP PACEART REMOTE DEVICE CHECK
Date Time Interrogation Session: 20260104052932
Implantable Pulse Generator Implant Date: 20231218
Pulse Gen Model: 5000
Pulse Gen Serial Number: 511020630

## 2024-01-21 NOTE — Progress Notes (Unsigned)
" °  Cardiology Office Note:  .   Date:  01/21/2024  ID:  Ian [name], DOB 09-16-1945, MRN [id number] PCP: Vernadine Charlie ORN, MD  [facility] Providers Cardiologist:  Aleene Passe, MD (Inactive) Electrophysiologist:  Eulas FORBES Furbish, MD {  History of Present Illness: .   Ian [name] is a 79 y.o. male with history of CAD status post stenting 2018/2020, heart failure with mildly reduced EF, left bundle branch block, hypertension, hyperlipidemia, gastric bypass 2005, anemia.     CAD Per chart review stenting in 2018/2020 in [facility] .  Unclear what was stented. 05/2023 Myoview  with no ischemia/infarction.  Low risk study.  Cardiomyopathy 02/2020 EF 40 to 45% 12/2021 EF 45 to 50%  History of syncope 2022 felt to be orthostatic.  Carvedilol  reduced to 3.125 mg twice daily.   01/2021 episode of unresponsiveness while doing woodworking.  Another episode 06/2021.  14-day heart monitor with no arrhythmias. 12/2021 loop recorder implanted.  Social history  Likes to do woodworking.     Patient with history of CAD with remote history of PCI at outside hospital.  History of syncope with ILR although seems to have been resolved in the past couple years.  Last seen 12/2022 and seen on an annual basis without any acute complaints.  Issues of dysphagia?  EGD planned 03/2024  CAD Hyperlipidemia - Previous history of stenting  Heart failure with mildly reduced EF Left bundle branch block -12/2021 EF 45 to 50% Will update echocardiogram.  Given history of mild aortic stenosis.  Hypertension  History of syncope with ILR No further episodes.   ROS: Denies: Chest pain, shortness of breath, orthopnea, peripheral edema, palpitations, syncope, decreased exercise capacity, fatigue, dizziness.   Studies Reviewed: .         Risk Assessment/Calculations:   {Does this patient have ATRIAL FIBRILLATION?:[id number]} No BP recorded.  {Refresh Note OR Click here to enter BP  :1}***        Physical Exam:   VS:  There were no vitals taken for this visit.   Wt Readings from Last 3 Encounters:  05/07/23 172 lb (78 kg)  02/02/23 177 lb 14.6 oz (80.7 kg)  01/19/23 178 lb (80.7 kg)    GEN: Well nourished, well developed in no acute distress NECK: No JVD; No carotid bruits CARDIAC: ***RRR, no murmurs, rubs, gallops RESPIRATORY:  Clear to auscultation without rales, wheezing or rhonchi  ABDOMEN: Soft, non-tender, non-distended EXTREMITIES:  No edema; No deformity   ASSESSMENT AND PLAN: .         {Are you ordering a CV Procedure (e.g. stress test, cath, DCCV, TEE, etc)?   Press F2        :[id number]}  Dispo: ***  Signed, Thom LITTIE Sluder, PA-C  "

## 2024-01-21 NOTE — Progress Notes (Signed)
 Remote Loop Recorder Transmission

## 2024-01-23 ENCOUNTER — Ambulatory Visit: Payer: Self-pay | Admitting: Cardiovascular Disease

## 2024-01-26 ENCOUNTER — Encounter: Payer: Self-pay | Admitting: Cardiology

## 2024-01-26 ENCOUNTER — Ambulatory Visit: Attending: Cardiology | Admitting: Cardiology

## 2024-01-26 VITALS — BP 98/60 | HR 63 | Ht 65.0 in | Wt 165.0 lb

## 2024-01-26 DIAGNOSIS — I77819 Aortic ectasia, unspecified site: Secondary | ICD-10-CM | POA: Diagnosis not present

## 2024-01-26 DIAGNOSIS — I5020 Unspecified systolic (congestive) heart failure: Secondary | ICD-10-CM | POA: Diagnosis not present

## 2024-01-26 DIAGNOSIS — R55 Syncope and collapse: Secondary | ICD-10-CM | POA: Diagnosis not present

## 2024-01-26 DIAGNOSIS — I447 Left bundle-branch block, unspecified: Secondary | ICD-10-CM | POA: Diagnosis not present

## 2024-01-26 DIAGNOSIS — I2510 Atherosclerotic heart disease of native coronary artery without angina pectoris: Secondary | ICD-10-CM | POA: Diagnosis not present

## 2024-01-26 DIAGNOSIS — I10 Essential (primary) hypertension: Secondary | ICD-10-CM

## 2024-01-26 DIAGNOSIS — R0789 Other chest pain: Secondary | ICD-10-CM

## 2024-01-26 DIAGNOSIS — I209 Angina pectoris, unspecified: Secondary | ICD-10-CM

## 2024-01-26 MED ORDER — CLOPIDOGREL BISULFATE 75 MG PO TABS: 75.0000 mg | ORAL_TABLET | Freq: Every day | ORAL | 3 refills | Status: AC

## 2024-01-26 MED ORDER — NITROGLYCERIN 0.4 MG SL SUBL: SUBLINGUAL_TABLET | SUBLINGUAL | 6 refills | Status: AC

## 2024-01-26 MED ORDER — SACUBITRIL-VALSARTAN 24-26 MG PO TABS: 1.0000 | ORAL_TABLET | Freq: Two times a day (BID) | ORAL | 3 refills | Status: AC

## 2024-01-26 MED ORDER — ATORVASTATIN CALCIUM 40 MG PO TABS: 40.0000 mg | ORAL_TABLET | Freq: Every day | ORAL | 3 refills | Status: AC

## 2024-01-26 NOTE — Patient Instructions (Signed)
 Medication Instructions:  Your physician recommends that you continue on your current medications as directed. Please refer to the Current Medication list given to you today.  *If you need a refill on your cardiac medications before your next appointment, please call your pharmacy*  Testing/Procedures: Your physician has requested that you have an echocardiogram. Echocardiography is a painless test that uses sound waves to create images of your heart. It provides your doctor with information about the size and shape of your heart and how well your hearts chambers and valves are working. This procedure takes approximately one hour. There are no restrictions for this procedure. Please do NOT wear cologne, perfume, aftershave, or lotions (deodorant is allowed). Please arrive 15 minutes prior to your appointment time.  Please note: We ask at that you not bring children with you during ultrasound (echo/ vascular) testing. Due to room size and safety concerns, children are not allowed in the ultrasound rooms during exams. Our front office staff cannot provide observation of children in our lobby area while testing is being conducted. An adult accompanying a patient to their appointment will only be allowed in the ultrasound room at the discretion of the ultrasound technician under special circumstances. We apologize for any inconvenience.   Follow-Up: At [facility], you and your health needs are our priority.  As part of our continuing mission to provide you with exceptional heart care, our providers are all part of one team.  This team includes your primary Cardiologist (physician) and Advanced Practice Providers or APPs (Physician Assistants and Nurse Practitioners) who all work together to provide you with the care you need, when you need it.  Your next appointment:   6 month(s)  Provider:   Jerel Balding, MD    We recommend signing up for the patient portal called MyChart.  Sign up  information is provided on this After Visit Summary.  MyChart is used to connect with patients for Virtual Visits (Telemedicine).  Patients are able to view lab/test results, encounter notes, upcoming appointments, etc.  Non-urgent messages can be sent to your provider as well.   To learn more about what you can do with MyChart, go to [email or web address].

## 2024-02-16 ENCOUNTER — Telehealth: Payer: Self-pay | Admitting: Gastroenterology

## 2024-02-16 MED ORDER — VOQUEZNA 10 MG PO TABS: 10.0000 mg | ORAL_TABLET | Freq: Every day | ORAL | 0 refills | Status: AC

## 2024-02-16 NOTE — Telephone Encounter (Signed)
 Patient last seen 04-2023. (Note: he was referred to [facility] to the motility center for Endoflip, rule out achalasia).  Refill (90 day) for Voquezna  10 mg sent to pharmacy. Patient should schedule an appointment in April for 1 yr F/U

## 2024-02-16 NOTE — Telephone Encounter (Signed)
 Inbound call from patient stating that his pharmacy has missed us  his refill for Voquezna . Patient is needing a new prescription for this medication. Please advise.

## 2024-02-18 ENCOUNTER — Ambulatory Visit: Payer: Self-pay

## 2024-02-18 LAB — CUP PACEART REMOTE DEVICE CHECK
Date Time Interrogation Session: 20260205052755
Implantable Pulse Generator Implant Date: 20231218
Pulse Gen Model: 5000
Pulse Gen Serial Number: 511020630

## 2024-03-09 ENCOUNTER — Ambulatory Visit (HOSPITAL_COMMUNITY)

## 2024-03-21 ENCOUNTER — Ambulatory Visit

## 2024-04-21 ENCOUNTER — Ambulatory Visit

## 2024-05-22 ENCOUNTER — Ambulatory Visit

## 2024-06-22 ENCOUNTER — Ambulatory Visit

## 2024-07-04 ENCOUNTER — Ambulatory Visit: Admitting: Cardiovascular Disease

## 2024-07-23 ENCOUNTER — Ambulatory Visit

## 2024-08-23 ENCOUNTER — Ambulatory Visit

## 2024-09-23 ENCOUNTER — Ambulatory Visit

## 2024-10-24 ENCOUNTER — Ambulatory Visit

## 2024-11-24 ENCOUNTER — Ambulatory Visit

## 2024-12-25 ENCOUNTER — Ambulatory Visit

## 2025-01-25 ENCOUNTER — Ambulatory Visit

## 2025-02-25 ENCOUNTER — Ambulatory Visit
